# Patient Record
Sex: Female | Born: 1995 | Race: Black or African American | Hispanic: No | Marital: Single | Smoking: Former smoker
Health system: Southern US, Community
[De-identification: ages and names within clinical notes are randomized; demographics above are authoritative.]

## PROBLEM LIST (undated history)

## (undated) ENCOUNTER — Inpatient Hospital Stay (HOSPITAL_COMMUNITY): Payer: Self-pay

## (undated) DIAGNOSIS — D649 Anemia, unspecified: Secondary | ICD-10-CM

## (undated) DIAGNOSIS — G43909 Migraine, unspecified, not intractable, without status migrainosus: Secondary | ICD-10-CM

## (undated) DIAGNOSIS — F909 Attention-deficit hyperactivity disorder, unspecified type: Secondary | ICD-10-CM

## (undated) DIAGNOSIS — F32A Depression, unspecified: Secondary | ICD-10-CM

## (undated) DIAGNOSIS — F419 Anxiety disorder, unspecified: Secondary | ICD-10-CM

## (undated) HISTORY — DX: Anxiety disorder, unspecified: F41.9

## (undated) HISTORY — PX: NO PAST SURGERIES: SHX2092

## (undated) HISTORY — PX: NERVE SURGERY: SHX1016

---
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
## 2017-05-12 ENCOUNTER — Encounter (HOSPITAL_COMMUNITY): Payer: Self-pay

## 2017-10-28 DIAGNOSIS — G43009 Migraine without aura, not intractable, without status migrainosus: Secondary | ICD-10-CM | POA: Insufficient documentation

## 2020-01-01 ENCOUNTER — Encounter (HOSPITAL_COMMUNITY): Payer: Self-pay | Admitting: Emergency Medicine

## 2020-01-01 ENCOUNTER — Emergency Department (HOSPITAL_COMMUNITY)
Admission: EM | Admit: 2020-01-01 | Discharge: 2020-01-01 | Disposition: A | Discharge status: Home / Self Care | Payer: Medicaid Other | Attending: Emergency Medicine | Admitting: Emergency Medicine

## 2020-01-01 ENCOUNTER — Other Ambulatory Visit: Payer: Self-pay

## 2020-01-01 DIAGNOSIS — H53149 Visual discomfort, unspecified: Secondary | ICD-10-CM | POA: Diagnosis not present

## 2020-01-01 DIAGNOSIS — R059 Cough, unspecified: Secondary | ICD-10-CM

## 2020-01-01 DIAGNOSIS — F17228 Nicotine dependence, chewing tobacco, with other nicotine-induced disorders: Secondary | ICD-10-CM | POA: Insufficient documentation

## 2020-01-01 DIAGNOSIS — R05 Cough: Secondary | ICD-10-CM | POA: Insufficient documentation

## 2020-01-01 DIAGNOSIS — F1210 Cannabis abuse, uncomplicated: Secondary | ICD-10-CM | POA: Diagnosis not present

## 2020-01-01 DIAGNOSIS — R112 Nausea with vomiting, unspecified: Secondary | ICD-10-CM | POA: Insufficient documentation

## 2020-01-01 DIAGNOSIS — F40298 Other specified phobia: Secondary | ICD-10-CM | POA: Insufficient documentation

## 2020-01-01 DIAGNOSIS — G43809 Other migraine, not intractable, without status migrainosus: Secondary | ICD-10-CM | POA: Insufficient documentation

## 2020-01-01 DIAGNOSIS — Z20822 Contact with and (suspected) exposure to covid-19: Secondary | ICD-10-CM | POA: Diagnosis not present

## 2020-01-01 DIAGNOSIS — R519 Headache, unspecified: Secondary | ICD-10-CM | POA: Diagnosis present

## 2020-01-01 LAB — URINALYSIS, ROUTINE W REFLEX MICROSCOPIC
Bilirubin Urine: NEGATIVE
Glucose, UA: NEGATIVE mg/dL
Hgb urine dipstick: NEGATIVE
Ketones, ur: NEGATIVE mg/dL
Nitrite: NEGATIVE
Protein, ur: NEGATIVE mg/dL
Specific Gravity, Urine: 1.018 (ref 1.005–1.030)
WBC, UA: >50 WBC/hpf — ABNORMAL HIGH (ref 0–5)
pH: 8 (ref 5.0–8.0)

## 2020-01-01 LAB — CBC
HCT: 36.2 % (ref 36.0–46.0)
Hemoglobin: 10.6 g/dL — ABNORMAL LOW (ref 12.0–15.0)
MCH: 24.1 pg — ABNORMAL LOW (ref 26.0–34.0)
MCHC: 29.3 g/dL — ABNORMAL LOW (ref 30.0–36.0)
MCV: 82.5 fL (ref 80.0–100.0)
Platelets: 278 10*3/uL (ref 150–400)
RBC: 4.39 MIL/uL (ref 3.87–5.11)
RDW: 15.9 % — ABNORMAL HIGH (ref 11.5–15.5)
WBC: 6.8 10*3/uL (ref 4.0–10.5)
nRBC: 0 % (ref 0.0–0.2)

## 2020-01-01 LAB — COMPREHENSIVE METABOLIC PANEL
ALT: 18 U/L (ref 0–44)
AST: 19 U/L (ref 15–41)
Albumin: 3.7 g/dL (ref 3.5–5.0)
Alkaline Phosphatase: 72 U/L (ref 38–126)
Anion gap: 8 (ref 5–15)
BUN: 9 mg/dL (ref 6–20)
CO2: 24 mmol/L (ref 22–32)
Calcium: 9.3 mg/dL (ref 8.9–10.3)
Chloride: 108 mmol/L (ref 98–111)
Creatinine, Ser: 0.92 mg/dL (ref 0.44–1.00)
GFR calc Af Amer: >60 mL/min (ref >60)
GFR calc non Af Amer: >60 mL/min (ref >60)
Glucose, Bld: 113 mg/dL — ABNORMAL HIGH (ref 70–99)
Potassium: 4.5 mmol/L (ref 3.5–5.1)
Sodium: 140 mmol/L (ref 135–145)
Total Bilirubin: 0.3 mg/dL (ref 0.3–1.2)
Total Protein: 7.6 g/dL (ref 6.5–8.1)

## 2020-01-01 LAB — I-STAT BETA HCG BLOOD, ED (MC, WL, AP ONLY): I-stat hCG, quantitative: <5 m[IU]/mL (ref <5)

## 2020-01-01 LAB — RAPID URINE DRUG SCREEN, HOSP PERFORMED
Amphetamines: NOT DETECTED
Barbiturates: NOT DETECTED
Benzodiazepines: NOT DETECTED
Cocaine: NOT DETECTED
Opiates: NOT DETECTED
Tetrahydrocannabinol: POSITIVE — AB

## 2020-01-01 LAB — LIPASE, BLOOD: Lipase: 30 U/L (ref 11–51)

## 2020-01-01 MED ORDER — SODIUM CHLORIDE 0.9% FLUSH
3.0000 mL | Freq: Once | INTRAVENOUS | Status: AC
  Administered 2020-01-01: 15:00:00 3 mL via INTRAVENOUS

## 2020-01-01 MED ORDER — METOCLOPRAMIDE HCL 5 MG/ML IJ SOLN
5.0000 mg | Freq: Once | INTRAMUSCULAR | Status: AC
  Administered 2020-01-01: 15:00:00 5 mg via INTRAVENOUS
  Filled 2020-01-01: qty 2

## 2020-01-01 MED ORDER — SODIUM CHLORIDE 0.9 % IV BOLUS
1000.0000 mL | Freq: Once | INTRAVENOUS | Status: AC
  Administered 2020-01-01: 15:00:00 1000 mL via INTRAVENOUS

## 2020-01-01 MED ORDER — DIPHENHYDRAMINE HCL 50 MG/ML IJ SOLN
25.0000 mg | Freq: Once | INTRAMUSCULAR | Status: AC
  Administered 2020-01-01: 15:00:00 25 mg via INTRAVENOUS
  Filled 2020-01-01: qty 1

## 2020-01-01 MED ORDER — KETOROLAC TROMETHAMINE 15 MG/ML IJ SOLN
15.0000 mg | Freq: Once | INTRAMUSCULAR | Status: AC
  Administered 2020-01-01: 17:00:00 15 mg via INTRAVENOUS
  Filled 2020-01-01: qty 1

## 2020-01-01 NOTE — ED Provider Notes (Addendum)
Care handoff received from [facility], PA-C at shift change, please see previous provider note for full details of visit.  In short 24 year old female with history of ADHD, migraines, cannabis use presented today for headache, nausea, vomiting and chills.  Headache today feels similar to previous migraines.  No red flag symptoms.  Lab work obtained given emesis and migraine cocktail provided.  No Covid exposures but plan to obtain outpatient Covid test.  CBC showed no leukocytosis, hemoglobin of 10.6 appears consistent with prior.  CMP without emergent electrolyte derangement, evidence of kidney injury or acute elevation of LFTs.  Pregnancy test negative.  Lipase within normal limits no evidence of pancreatitis.  UDS and urinalysis pending at shift change.  Plan of care at shift change was to await UDS and urinalysis, reassess.  Pending improvement, p.o. challenge and discharge with outpatient Covid test. Physical Exam  BP (!) 140/94 (BP Location: Right Arm)   Pulse 83   Temp 98.3 F (36.8 C) (Oral)   Resp 18   Ht 5\' 6"  (1.676 m)   Wt 104.3 kg   LMP 12/20/2019   SpO2 100%   BMI 37.12 kg/m   Physical Exam Constitutional:      General: She is not in acute distress.    Appearance: Normal appearance. She is well-developed. She is not ill-appearing or diaphoretic.  HENT:     Head: Normocephalic and atraumatic.     Right Ear: External ear normal.     Left Ear: External ear normal.     Nose: Nose normal.  Eyes:     General: Vision grossly intact. Gaze aligned appropriately.     Pupils: Pupils are equal, round, and reactive to light.  Neck:     Trachea: Trachea and phonation normal. No tracheal deviation.     Meningeal: Brudzinski's sign absent.  Pulmonary:     Effort: Pulmonary effort is normal. No respiratory distress.  Musculoskeletal:        General: Normal range of motion.     Cervical back: Full passive range of motion without pain, normal range of motion and neck supple.   Skin:    General: Skin is warm and dry.  Neurological:     Mental Status: She is alert.     GCS: GCS eye subscore is 4. GCS verbal subscore is 5. GCS motor subscore is 6.     Comments: Speech is clear and goal oriented, follows commands Major Cranial nerves without deficit, no facial droop Moves extremities without ataxia, coordination intact Negative Romberg, no pronator drift  Psychiatric:        Behavior: Behavior normal.     ED Course/Procedures     Procedures  MDM  Patient was given migraine cocktail by previous provider, 1 L fluid bolus, Benadryl 25 mg and Reglan 5 mg.  Afterwards headache had continued and previous provider ordered 15 mg Toradol.  On reevaluation patient is sleeping, easily arousable to voice.  She reports that her headache is resolved and she would like to go home she has no concerns at this time.  Her urinalysis has just resulted it shows contamination with 21-50 squamous cells, there are multiple white blood cells as well as large leukocytes, nitrite is negative.  Patient denies any urinary symptoms on exam, she has no GU concerns and wishes to await culture report before proceeding with any antibiotics as she is asymptomatic, feel this is reasonable at this time. Discussed with Dr. Darl Householder. UDS is still pending at discharge however do not  feel positive results would change management today and do not feel patient needs to stay for those results.  Patient complained of URI symptoms to previous provider, outpatient Covid test has been ordered.  Patient aware to follow-up on results on her MyChart account tomorrow and discuss results with the primary care provider and to quarantine x10 days.  She has no active cough, no shortness of breath or additional concerns at this time.  She has normal gait at discharge, normal neuro exam, no meningeal signs she is well-appearing and in no acute distress, her father is driving home.  At this time there does not appear to be any  evidence of an acute emergency medical condition and the patient appears stable for discharge with appropriate outpatient follow up. Diagnosis was discussed with patient who verbalizes understanding of care plan and is agreeable to discharge. I have discussed return precautions with patient and father who verbalizes understanding of return precautions. Patient encouraged to follow-up with their PCP. All questions answered. = Addendum: UDS positive for THC otherwise negative.  Note: Portions of this report may have been transcribed using voice recognition software. Every effort was made to ensure accuracy; however, inadvertent computerized transcription errors may still be present.    Bill Salinas, PA-C 01/01/20 1858    Elizabeth Palau 01/01/20 Dorene Sorrow, MD 01/02/20 [phone]

## 2020-01-01 NOTE — Discharge Instructions (Signed)
You have been diagnosed today with Headache, nausea, vomiting, Cough, Concern for COVID-19 infection.  At this time there does not appear to be the presence of an emergent medical condition, however there is always the potential for conditions to change. Please read and follow the below instructions.  Please return to the Emergency Department immediately for any new or worsening symptoms. Please be sure to follow up with your Primary Care Provider within one week regarding your visit today; please call their office to schedule an appointment even if you are feeling better for a follow-up visit. Your Covid test is pending.  Your result will be available on your MyChart account in the next 1-2 days.  Please discuss the results with your primary care provider when they are available.  Continue to wear your mask and to quarantine x 10 days to avoid spread of any illnesses.  Get help right away if: Your migraine headache gets very bad. Your migraine headache lasts longer than 72 hours. You have a fever. You have a stiff neck. You have trouble seeing. Your muscles feel weak or like you cannot control them. You start to lose your balance a lot. You start to have trouble walking. You pass out (faint). You have a seizure. You have any new/concerning or worsening of symptoms  Please read the additional information packets attached to your discharge summary.  Do not take your medicine if  develop an itchy rash, swelling in your mouth or lips, or difficulty breathing; call 911 and seek immediate emergency medical attention if this occurs.  Note: Portions of this text may have been transcribed using voice recognition software. Every effort was made to ensure accuracy; however, inadvertent computerized transcription errors may still be present.

## 2020-01-01 NOTE — ED Notes (Signed)
Pt brought back until urine resulted

## 2020-01-01 NOTE — ED Triage Notes (Signed)
Pt. Stated, Tracey Ho had a headache with Nausea vomiting since this morning. I also have chills.

## 2020-01-01 NOTE — ED Provider Notes (Signed)
[facility] EMERGENCY DEPARTMENT Provider Note   CSN: [id number] Arrival date & time: 01/01/20  1319     History Chief Complaint  Patient presents with  . Headache  . Emesis  . Nausea    Katlyn Muldrew is a 24 y.o. female with past  medical history significant for ADHD, migraine, cannabis abuse who presents for evaluation of headache, nausea and vomiting.  Also has chills.  Has had nonproductive cough.  Denies sudden onset thunderclap headache.  Rates her pain a 10/10.  States feels similar to prior migraines.  Associated photophobia without phonophobia.  No neck pain, neck stiffness, chest pain, shortness of breath, abdominal pain, diarrhea, dysuria, pelvic pain, vaginal discharge, unilateral weakness, facial droop, difficulty with word finding.  No known Covid contacts or she has been coughing as well.  Known cannabinoid use.  Has not take anything for symptoms.  Denies additional aggravating or alleviating factors.  History obtained from patient and past medical records.  No interpreter is used.  HPI     Past Medical History:  Diagnosis Date  . ADHD (attention deficit hyperactivity disorder)   . Migraine headache     Patient Active Problem List   Diagnosis Date Noted  . MDD (major depressive disorder), single episode, moderate (HCC) 08/04/2012  . ADHD (attention deficit hyperactivity disorder), combined type 08/04/2012  . ODD (oppositional defiant disorder) 08/04/2012  . Cannabis abuse 08/04/2012    Past Surgical History:  Procedure Laterality Date  . NERVE SURGERY    . NO PAST SURGERIES       OB History    Gravida  5   Para  0   Term  0   Preterm  0   AB  2   Living  0     SAB  2   TAB  0   Ectopic  0   Multiple  0   Live Births  1           Family History  Problem Relation Age of Onset  . Thyroid disease Mother     Social History   Tobacco Use  . Smoking status: Former Smoker    Types: Cigars  . Smokeless tobacco:  Current User  Substance Use Topics  . Alcohol use: No    Comment: LAST TIME WAS 2 MTHS AGO  . Drug use: Yes    Types: Marijuana    Comment: Last use Sept 2013    Home Medications Prior to Admission medications   Medication Sig Start Date End Date Taking? Authorizing Provider  acetaminophen (TYLENOL) 325 MG tablet Take 650 mg by mouth every 6 (six) hours as needed. Pain    [provider]  Prenatal Vit-Fe Fumarate-FA (PRENATAL MULTIVITAMIN) TABS Take 1 tablet by mouth daily. 08/10/12   Winson, Louie Bun, NP  [place] [facility] [address] 1 application vaginally daily.    [provider]    Allergies    Patient has no known allergies.  Review of Systems   Review of Systems  Constitutional: Negative.   HENT: Negative.   Eyes: Positive for photophobia. Negative for pain, discharge, redness, itching and visual disturbance.  Respiratory: Positive for cough. Negative for apnea, choking, chest tightness, shortness of breath, wheezing and stridor.   Cardiovascular: Negative.   Gastrointestinal: Positive for nausea and vomiting. Negative for abdominal distention, abdominal pain, anal bleeding, blood in stool, constipation, diarrhea and rectal pain.  Genitourinary: Negative.   Musculoskeletal: Negative.   Skin: Negative.   Neurological: Positive  for headaches. Negative for dizziness, tremors, seizures, syncope, facial asymmetry, speech difficulty, weakness, light-headedness and numbness.  All other systems reviewed and are negative.   Physical Exam Updated Vital Signs BP (!) 140/94 (BP Location: Right Arm)   Pulse 83   Temp 98.3 F (36.8 C) (Oral)   Resp 18   Ht 5\' 6"  (1.676 m)   Wt 104.3 kg   LMP 12/20/2019   SpO2 100%   BMI 37.12 kg/m   Physical Exam Physical Exam  Constitutional: Pt is oriented to person, place, and time. Pt appears well-developed and well-nourished. No distress.  HENT:  Head: Normocephalic and atraumatic.  Mouth/Throat: Oropharynx is clear and  moist.  Eyes: Conjunctivae and EOM are normal. Pupils are equal, round, and reactive to light. No scleral icterus.  No horizontal, vertical or rotational nystagmus  Neck: Normal range of motion. Neck supple.  Full active and passive ROM without pain No midline or paraspinal tenderness No nuchal rigidity or meningeal signs  Cardiovascular: Normal rate, regular rhythm and intact distal pulses.   Pulmonary/Chest: Effort normal and breath sounds normal. No respiratory distress. Pt has no wheezes. No rales.  Abdominal: Soft. Bowel sounds are normal. There is no tenderness. There is no rebound and no guarding.  Musculoskeletal: Normal range of motion.  Lymphadenopathy:    No cervical adenopathy.  Neurological: Pt. is alert and oriented to person, place, and time. He has normal reflexes. No cranial nerve deficit.  Exhibits normal muscle tone. Coordination normal.  Mental Status:  Alert, oriented, thought content appropriate. Speech fluent without evidence of aphasia. Able to follow 2 step commands without difficulty.  Cranial Nerves:  II:  Peripheral visual fields grossly normal, pupils equal, round, reactive to light III,IV, VI: ptosis not present, extra-ocular motions intact bilaterally  V,VII: smile symmetric, facial light touch sensation equal VIII: hearing grossly normal bilaterally  IX,X: midline uvula rise  XI: bilateral shoulder shrug equal and strong XII: midline tongue extension  Motor:  5/5 in upper and lower extremities bilaterally including strong and equal grip strength and dorsiflexion/plantar flexion Sensory: Pinprick and light touch normal in all extremities.  Deep Tendon Reflexes: 2+ and symmetric  Cerebellar: normal finger-to-nose with bilateral upper extremities Gait: normal gait and balance CV: distal pulses palpable throughout   Skin: Skin is warm and dry. No rash noted. Pt is not diaphoretic.  Psychiatric: Pt has a normal mood and affect. Behavior is normal. Judgment  and thought content normal.  Nursing note and vitals reviewed. ED Results / Procedures / Treatments   Labs (all labs ordered are listed, but only abnormal results are displayed) Labs Reviewed  COMPREHENSIVE METABOLIC PANEL - Abnormal; Notable for the following components:      Result Value   Glucose, Bld 113 (*)    All other components within normal limits  CBC - Abnormal; Notable for the following components:   Hemoglobin 10.6 (*)    MCH 24.1 (*)    MCHC 29.3 (*)    RDW 15.9 (*)    All other components within normal limits  LIPASE, BLOOD  URINALYSIS, ROUTINE W REFLEX MICROSCOPIC  RAPID URINE DRUG SCREEN, HOSP PERFORMED  I-STAT BETA HCG BLOOD, ED ([facility], [facility], [facility] ONLY)    EKG None  Radiology No results found.  Procedures Procedures (including critical care time)  Medications Ordered in ED Medications  ketorolac (TORADOL) 15 MG/ML injection 15 mg (has no administration in time range)  sodium chloride flush (NS) 0.9 % injection 3 mL (3 mLs Intravenous  Given 01/01/20 1446)  diphenhydrAMINE (BENADRYL) injection 25 mg (25 mg Intravenous Given 01/01/20 1443)  metoCLOPramide (REGLAN) injection 5 mg (5 mg Intravenous Given 01/01/20 1443)  sodium chloride 0.9 % bolus 1,000 mL (1,000 mLs Intravenous New Bag/Given 01/01/20 1445)    ED Course  I have reviewed the triage vital signs and the nursing notes.  Pertinent labs & imaging results that were available during my care of the patient were reviewed by me and considered in my medical decision making (see chart for details).  25 year old female presents for evaluation of headache, nausea and vomiting.  Known history of migraines states this feels similar.  Has also had cough however no known Covid exposures.  Multiple episodes of NBNB emesis.  Also known cannabinoid use.  No sudden onset thunderclap headache, recent injury or trauma.  No neck stiffness or neck rigidity.  No meningismus.  Her heart and lungs are clear.  Her abdomen is soft,  nontender without rebound or guarding.  No unilateral leg swelling, redness or warmth.  Given emesis will obtain labs, provide migraine cocktail.  Given she is afebrile has clear lung sounds do not think she needs chest x-ray at this time.  Will also give fluid bolus.  Plan on outpatient Covid testing  Labs and imaging personally reviewed and interpreted: CBC without leukocytosis, hemoglobin 10.6 Metabolic panel with mild hyperglycemia to 113, Regnancy test negative Lipase 30 Urinalysis pending UDS pending   Care transferred to [facility], [place] who will follow up on urinalysis, UDS and reevaluation of patient.  If patient headache improved, likely p.o. challenge and do outpatient Covid test.  If headache unresolved may try magnesium for additional management.  Presentation  non concerning for dissection, CVA, SAH, ICH, Meningitis, or temporal arteritis.    MDM Rules/Calculators/A&P                      Lian Pounds was evaluated in Emergency Department on 01/01/2020 for the symptoms described in the history of present illness. She was evaluated in the context of the global COVID-19 pandemic, which necessitated consideration that the patient might be at risk for infection with the SARS-CoV-2 virus that causes COVID-19. Institutional protocols and algorithms that pertain to the evaluation of patients at risk for COVID-19 are in a state of rapid change based on information released by regulatory bodies including the CDC and federal and state organizations. These policies and algorithms were followed during the patient's care in the ED. Final Clinical Impression(s) / ED Diagnoses Final diagnoses:  Non-intractable vomiting with nausea, unspecified vomiting type  Other migraine without status migrainosus, not intractable  COVID-19 virus test result unknown  Cough    Rx / DC Orders ED Discharge Orders    None       Suzy Kugel A, PA-C 01/01/20 1603    Jacalyn Lefevre, MD 01/01/20  1612

## 2020-01-02 LAB — URINE CULTURE

## 2020-01-02 LAB — SARS CORONAVIRUS 2 (TAT 6-24 HRS): SARS Coronavirus 2: NEGATIVE

## 2020-10-22 DIAGNOSIS — N730 Acute parametritis and pelvic cellulitis: Secondary | ICD-10-CM

## 2020-10-22 DIAGNOSIS — A6000 Herpesviral infection of urogenital system, unspecified: Secondary | ICD-10-CM | POA: Insufficient documentation

## 2020-10-22 HISTORY — DX: Acute parametritis and pelvic cellulitis: N73.0

## 2021-04-16 DIAGNOSIS — A630 Anogenital (venereal) warts: Secondary | ICD-10-CM | POA: Insufficient documentation

## 2022-01-08 ENCOUNTER — Inpatient Hospital Stay (HOSPITAL_COMMUNITY)
Admission: AD | Admit: 2022-01-08 | Discharge: 2022-01-08 | Disposition: A | Discharge status: Home / Self Care | Payer: Medicaid Other | Attending: Obstetrics and Gynecology | Admitting: Obstetrics and Gynecology

## 2022-01-08 ENCOUNTER — Other Ambulatory Visit: Payer: Self-pay

## 2022-01-08 ENCOUNTER — Inpatient Hospital Stay (HOSPITAL_COMMUNITY): Payer: Medicaid Other

## 2022-01-08 ENCOUNTER — Encounter (HOSPITAL_COMMUNITY): Payer: Self-pay | Admitting: Obstetrics and Gynecology

## 2022-01-08 DIAGNOSIS — R42 Dizziness and giddiness: Secondary | ICD-10-CM | POA: Insufficient documentation

## 2022-01-08 DIAGNOSIS — R55 Syncope and collapse: Secondary | ICD-10-CM | POA: Diagnosis not present

## 2022-01-08 DIAGNOSIS — R109 Unspecified abdominal pain: Secondary | ICD-10-CM | POA: Insufficient documentation

## 2022-01-08 DIAGNOSIS — Z3A09 9 weeks gestation of pregnancy: Secondary | ICD-10-CM | POA: Diagnosis not present

## 2022-01-08 DIAGNOSIS — O26891 Other specified pregnancy related conditions, first trimester: Secondary | ICD-10-CM | POA: Insufficient documentation

## 2022-01-08 DIAGNOSIS — O26841 Uterine size-date discrepancy, first trimester: Secondary | ICD-10-CM

## 2022-01-08 DIAGNOSIS — O99611 Diseases of the digestive system complicating pregnancy, first trimester: Secondary | ICD-10-CM | POA: Insufficient documentation

## 2022-01-08 DIAGNOSIS — O99280 Endocrine, nutritional and metabolic diseases complicating pregnancy, unspecified trimester: Secondary | ICD-10-CM

## 2022-01-08 DIAGNOSIS — K5900 Constipation, unspecified: Secondary | ICD-10-CM | POA: Diagnosis not present

## 2022-01-08 DIAGNOSIS — E860 Dehydration: Secondary | ICD-10-CM

## 2022-01-08 DIAGNOSIS — Z3A08 8 weeks gestation of pregnancy: Secondary | ICD-10-CM

## 2022-01-08 HISTORY — DX: Anemia, unspecified: D64.9

## 2022-01-08 LAB — COMPREHENSIVE METABOLIC PANEL
ALT: 11 U/L (ref 0–44)
AST: 14 U/L — ABNORMAL LOW (ref 15–41)
Albumin: 3.5 g/dL (ref 3.5–5.0)
Alkaline Phosphatase: 64 U/L (ref 38–126)
Anion gap: 8 (ref 5–15)
BUN: 7 mg/dL (ref 6–20)
CO2: 23 mmol/L (ref 22–32)
Calcium: 9 mg/dL (ref 8.9–10.3)
Chloride: 104 mmol/L (ref 98–111)
Creatinine, Ser: 0.77 mg/dL (ref 0.44–1.00)
GFR, Estimated: >60 mL/min (ref >60)
Glucose, Bld: 116 mg/dL — ABNORMAL HIGH (ref 70–99)
Potassium: 3.8 mmol/L (ref 3.5–5.1)
Sodium: 135 mmol/L (ref 135–145)
Total Bilirubin: 1.4 mg/dL — ABNORMAL HIGH (ref 0.3–1.2)
Total Protein: 7.2 g/dL (ref 6.5–8.1)

## 2022-01-08 LAB — URINALYSIS, ROUTINE W REFLEX MICROSCOPIC
Bilirubin Urine: NEGATIVE
Glucose, UA: NEGATIVE mg/dL
Ketones, ur: 20 mg/dL — AB
Nitrite: NEGATIVE
Protein, ur: 100 mg/dL — AB
Specific Gravity, Urine: 1.029 (ref 1.005–1.030)
pH: 5 (ref 5.0–8.0)

## 2022-01-08 LAB — CBC
HCT: 34 % — ABNORMAL LOW (ref 36.0–46.0)
Hemoglobin: 10.4 g/dL — ABNORMAL LOW (ref 12.0–15.0)
MCH: 24.5 pg — ABNORMAL LOW (ref 26.0–34.0)
MCHC: 30.6 g/dL (ref 30.0–36.0)
MCV: 80.2 fL (ref 80.0–100.0)
Platelets: 285 10*3/uL (ref 150–400)
RBC: 4.24 MIL/uL (ref 3.87–5.11)
RDW: 14.1 % (ref 11.5–15.5)
WBC: 6.2 10*3/uL (ref 4.0–10.5)
nRBC: 0 % (ref 0.0–0.2)

## 2022-01-08 LAB — WET PREP, GENITAL
Sperm: NONE SEEN
Trich, Wet Prep: NONE SEEN
WBC, Wet Prep HPF POC: >=10 — AB (ref <10)
Yeast Wet Prep HPF POC: NONE SEEN

## 2022-01-08 LAB — HCG, QUANTITATIVE, PREGNANCY: hCG, Beta Chain, Quant, S: 177801 m[IU]/mL — ABNORMAL HIGH (ref <5)

## 2022-01-08 MED ORDER — POLYETHYLENE GLYCOL 3350 17 GM/SCOOP PO POWD: 17.0000 g | Freq: Two times a day (BID) | ORAL | 2 refills | Status: DC | PRN

## 2022-01-08 MED ORDER — BISACODYL 10 MG RE SUPP: 10.0000 mg | Freq: Every day | RECTAL | 0 refills | Status: DC | PRN

## 2022-01-08 NOTE — MAU Provider Note (Addendum)
?History  ?  ? ?CSN: [id number] ? ?Arrival date and time: 01/08/22 1445 ? ? Event Date/Time  ? First Provider Initiated Contact with Patient 01/08/22 1522   ?  ? ?Chief Complaint  ?Patient presents with  ? Near Syncope  ? ?Tracey Ho is a 26 year old [id number] at [redacted]w[redacted]d who presented to MAU for dizziness which patient said lasted about 15 minutes this morning. Patient said she had just walked into the house when she felt the room spinning and got dizzy shortly after. She sat down to relax without improvement and started feeling warm and sweaty when she walked to the bathroom.  Her warm feeling and sweatiness lasted about 5 minutes however the dizziness continued.  Patient reports intermittent dizziness about once or twice a week since pregnancy usually lasting about 5 seconds.  She endorses abdominal cramps during these episode. She has a history of chronic constipation and reported last bowel movements to be about a week ago. She feels nauseated occasional but without emesis. She has appetite but have early satiety and endorses drinking adequate fluid, mostly water and juice. Patient denies any vision changes, chest pain or palpitation but reports mild headache that's not as bad as her migraines. She is no longer feeling dizzy but feels generally fatigued since the episode.  Per chart review patient's only [place] to date during pregnancy was indeterminate  for IUP which described locating intrauterine gestational sac. ? ?Near Syncope ?Associated symptoms include fatigue, headaches, nausea and weakness. Pertinent negatives include no chest pain, chills, fever or vomiting.  ? ?OB History   ? ? Gravida  ?5  ? Para  ?2  ? Term  ?2  ? Preterm  ?0  ? AB  ?2  ? Living  ?2  ?  ? ? SAB  ?2  ? IAB  ?0  ? Ectopic  ?0  ? Multiple  ?0  ? Live Births  ?2  ?   ?  ?  ? ? ?Past Medical History:  ?Diagnosis Date  ? ADHD (attention deficit hyperactivity disorder)   ? Anemia   ? Migraine headache   ? ? ?Past Surgical History:  ?Procedure  Laterality Date  ? NERVE SURGERY    ? NO PAST SURGERIES    ? ? ?Family History  ?Problem Relation Age of Onset  ? Thyroid disease Mother   ? ? ?Social History  ? ?Tobacco Use  ? Smoking status: Former  ?  Types: Cigars  ? Smokeless tobacco: Current  ?Vaping Use  ? Vaping Use: Every day  ?Substance Use Topics  ? Alcohol use: No  ?  Comment: LAST TIME WAS 2 MTHS AGO  ? Drug use: Not Currently  ?  Types: Marijuana  ?  Comment: Last use Sept 2013  ? ? ?Allergies: No Known Allergies ? ?Medications Prior to Admission  ?Medication Sig Dispense Refill Last Dose  ? acetaminophen (TYLENOL) 325 MG tablet Take 650 mg by mouth every 6 (six) hours as needed. Pain     ? Prenatal Vit-Fe Fumarate-FA (PRENATAL MULTIVITAMIN) TABS Take 1 tablet by mouth daily.     ? [place] [facility] [address] 1 application vaginally daily.     ? ? ?Review of Systems  ?Constitutional:  Positive for fatigue. Negative for appetite change, chills and fever.  ?Cardiovascular:  Positive for near-syncope. Negative for chest pain, palpitations and leg swelling.  ?Gastrointestinal:  Positive for constipation and nausea. Negative for diarrhea and vomiting.  ?Neurological:  Positive for dizziness, weakness  and headaches.  ?Physical Exam  ? ?Blood pressure 114/69, pulse 82, temperature 97.6 ?F (36.4 ?C), resp. rate 16, last menstrual period 11/02/2021, SpO2 96 %, unknown if currently breastfeeding. ? ?Physical Exam ?Constitutional:   ?   Appearance: Normal appearance.  ?Cardiovascular:  ?   Rate and Rhythm: Normal rate and regular rhythm.  ?   Pulses: Normal pulses.  ?   Heart sounds: Normal heart sounds.  ?Pulmonary:  ?   Effort: Pulmonary effort is normal.  ?   Breath sounds: Normal breath sounds. No rhonchi or rales.  ?Abdominal:  ?   General: Abdomen is flat. Bowel sounds are normal.  ?   Palpations: Abdomen is soft.  ?Neurological:  ?   General: No focal deficit present.  ?   Mental Status: She is alert and oriented to person, place, and time.  ?Psychiatric:     ?    Mood and Affect: Mood normal.     ?   Behavior: Behavior normal.  ? ? ?MAU Course  ?Procedures ? ?MDM ?Results for orders placed or performed during the hospital encounter of 01/08/22 (from the past 24 hour(s))  ?Urinalysis, Routine w reflex microscopic Urine, Clean Catch     Status: Abnormal  ? Collection Time: 01/08/22  3:00 PM  ?Result Value Ref Range  ? Color, Urine AMBER (A) YELLOW  ? APPearance CLOUDY (A) CLEAR  ? Specific Gravity, Urine 1.029 1.005 - 1.030  ? pH 5.0 5.0 - 8.0  ? Glucose, UA NEGATIVE NEGATIVE mg/dL  ? Hgb urine dipstick SMALL (A) NEGATIVE  ? Bilirubin Urine NEGATIVE NEGATIVE  ? Ketones, ur 20 (A) NEGATIVE mg/dL  ? Protein, ur 100 (A) NEGATIVE mg/dL  ? Nitrite NEGATIVE NEGATIVE  ? Leukocytes,Ua MODERATE (A) NEGATIVE  ? RBC / HPF 11-20 0 - 5 RBC/hpf  ? WBC, UA 11-20 0 - 5 WBC/hpf  ? Bacteria, UA MANY (A) NONE SEEN  ? Squamous Epithelial / LPF 11-20 0 - 5  ? Mucus PRESENT   ? Hyaline Casts, UA PRESENT   ? Non Squamous Epithelial 0-5 (A) NONE SEEN  ?hCG, quantitative, pregnancy     Status: Abnormal  ? Collection Time: 01/08/22  3:34 PM  ?Result Value Ref Range  ? hCG, Beta Chain, Quant, S 177,801 (H) <5 mIU/mL  ?CBC     Status: Abnormal  ? Collection Time: 01/08/22  3:34 PM  ?Result Value Ref Range  ? WBC 6.2 4.0 - 10.5 K/uL  ? RBC 4.24 3.87 - 5.11 MIL/uL  ? Hemoglobin 10.4 (L) 12.0 - 15.0 g/dL  ? HCT 34.0 (L) 36.0 - 46.0 %  ? MCV 80.2 80.0 - 100.0 fL  ? MCH 24.5 (L) 26.0 - 34.0 pg  ? MCHC 30.6 30.0 - 36.0 g/dL  ? RDW 14.1 11.5 - 15.5 %  ? Platelets 285 150 - 400 K/uL  ? nRBC 0.0 0.0 - 0.2 %  ?Comprehensive metabolic panel     Status: Abnormal  ? Collection Time: 01/08/22  3:34 PM  ?Result Value Ref Range  ? Sodium 135 135 - 145 mmol/L  ? Potassium 3.8 3.5 - 5.1 mmol/L  ? Chloride 104 98 - 111 mmol/L  ? CO2 23 22 - 32 mmol/L  ? Glucose, Bld 116 (H) 70 - 99 mg/dL  ? BUN 7 6 - 20 mg/dL  ? Creatinine, Ser 0.77 0.44 - 1.00 mg/dL  ? Calcium 9.0 8.9 - 10.3 mg/dL  ? Total Protein 7.2 6.5 - 8.1 g/dL  ?  Albumin 3.5 3.5 - 5.0 g/dL  ? AST 14 (L) 15 - 41 U/L  ? ALT 11 0 - 44 U/L  ? Alkaline Phosphatase 64 38 - 126 U/L  ? Total Bilirubin 1.4 (H) 0.3 - 1.2 mg/dL  ? GFR, Estimated >60 >60 mL/min  ? Anion gap 8 5 - 15  ?Wet prep, genital     Status: Abnormal  ? Collection Time: 01/08/22  5:03 PM  ?Result Value Ref Range  ? Yeast Wet Prep HPF POC NONE SEEN NONE SEEN  ? Trich, Wet Prep NONE SEEN NONE SEEN  ? Clue Cells Wet Prep HPF POC PRESENT (A) NONE SEEN  ? WBC, Wet Prep HPF POC >=10 (A) <10  ? Sperm NONE SEEN   ?  ? ?US OB LESS THAN 14 WEEKS WITH OB TRANSVAGINAL ? ?Result Date: 01/08/2022 ?CLINICAL DATA:  Abdominal pain. Estimated gestational age by last menstrual period equals 9 weeks 4 days EXAM: OBSTETRIC <14 WK US AND TRANSVAGINAL OB US TECHNIQUE: Both transabdominal and transvaginal ultrasound examinations were performed for complete evaluation of the gestation as well as the maternal uterus, adnexal regions, and pelvic cul-de-sac. Transvaginal technique was performed to assess early pregnancy. COMPARISON:  None. FINDINGS: Intrauterine gestational sac: Single Yolk sac:  Present Embryo:  Present Cardiac Activity: Present Heart Rate: 161 bpm MSD:   mm    w     d CRL:  20 mm   8 w   4 d                  US EDC: 08/16/2022 Subchorionic hemorrhage: Rounded mass in the myometrium measuring 4.6 x 3.9 cm. Masses adjacent to the endometrium and gestational sac. Favor a uterine leiomyoma rather than the subchorionic hemorrhage. Maternal uterus/adnexae: Normal ovaries. Corpus luteal cyst on the RIGHT IMPRESSION: 1. Single intrauterine gestation with embryo and normal cardiac activity. 2. Estimated gestational age by crown rump length equals 8 weeks 4 days. 3.  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   On: 01/08/2022 16:51   ? ? ?Assessment and Plan  ?Dizziness ?Patient with dizziness, sweating and generalized fatigued post event. EKG and electrolyte were normal. US show no signs  of hemorrhage and UA findings were consistent with possible dehydration. It is unclear as to main cause of her symptoms however her presentation is consistent with possible dehydration or vasovagal

## 2022-01-08 NOTE — MAU Note (Addendum)
Tracey Ho is a 26 y.o. at Unknown here in MAU reporting: Pt reports she went home around 1400 and she felt dizzy like she was going to pass out. Pt reports she went to the bathroom started pouring with sweat and called the ambulance because she thought she was going to pass out.  ?Onset of complaint: today 1400 ?Pain score: 6/10 abdomen cramping  ?There were no vitals filed for this visit.   ?Lab orders placed from triage:  ua  ?

## 2022-01-09 LAB — GC/CHLAMYDIA PROBE AMP (~~LOC~~) NOT AT ARMC
Chlamydia: POSITIVE — AB
Comment: NEGATIVE
Comment: NORMAL
Neisseria Gonorrhea: NEGATIVE

## 2022-01-11 DIAGNOSIS — Z8632 Personal history of gestational diabetes: Secondary | ICD-10-CM | POA: Insufficient documentation

## 2022-01-14 ENCOUNTER — Other Ambulatory Visit: Payer: Self-pay | Admitting: Advanced Practice Midwife

## 2022-01-14 NOTE — Progress Notes (Signed)
Pt retreated for Chlamydia at [place] at [facility] on 01/10/22.  ?

## 2022-03-19 ENCOUNTER — Inpatient Hospital Stay (HOSPITAL_COMMUNITY)
Admission: AD | Admit: 2022-03-19 | Discharge: 2022-03-19 | Disposition: A | Discharge status: Home / Self Care | Payer: Medicaid Other | Attending: Obstetrics and Gynecology | Admitting: Obstetrics and Gynecology

## 2022-03-19 ENCOUNTER — Other Ambulatory Visit: Payer: Self-pay

## 2022-03-19 ENCOUNTER — Encounter (HOSPITAL_COMMUNITY): Payer: Self-pay | Admitting: Obstetrics and Gynecology

## 2022-03-19 DIAGNOSIS — Z20822 Contact with and (suspected) exposure to covid-19: Secondary | ICD-10-CM | POA: Insufficient documentation

## 2022-03-19 DIAGNOSIS — O99322 Drug use complicating pregnancy, second trimester: Secondary | ICD-10-CM | POA: Diagnosis present

## 2022-03-19 DIAGNOSIS — F1190 Opioid use, unspecified, uncomplicated: Secondary | ICD-10-CM

## 2022-03-19 DIAGNOSIS — R0982 Postnasal drip: Secondary | ICD-10-CM

## 2022-03-19 DIAGNOSIS — O99512 Diseases of the respiratory system complicating pregnancy, second trimester: Secondary | ICD-10-CM | POA: Insufficient documentation

## 2022-03-19 DIAGNOSIS — O26892 Other specified pregnancy related conditions, second trimester: Secondary | ICD-10-CM | POA: Diagnosis not present

## 2022-03-19 DIAGNOSIS — Z3A18 18 weeks gestation of pregnancy: Secondary | ICD-10-CM

## 2022-03-19 DIAGNOSIS — R059 Cough, unspecified: Secondary | ICD-10-CM | POA: Diagnosis not present

## 2022-03-19 DIAGNOSIS — O219 Vomiting of pregnancy, unspecified: Secondary | ICD-10-CM | POA: Insufficient documentation

## 2022-03-19 LAB — CBC WITH DIFFERENTIAL/PLATELET
Abs Immature Granulocytes: 0.04 10*3/uL (ref 0.00–0.07)
Basophils Absolute: 0 10*3/uL (ref 0.0–0.1)
Basophils Relative: 0 %
Eosinophils Absolute: 0.1 10*3/uL (ref 0.0–0.5)
Eosinophils Relative: 2 %
HCT: 32.2 % — ABNORMAL LOW (ref 36.0–46.0)
Hemoglobin: 10.2 g/dL — ABNORMAL LOW (ref 12.0–15.0)
Immature Granulocytes: 1 %
Lymphocytes Relative: 13 %
Lymphs Abs: 0.9 10*3/uL (ref 0.7–4.0)
MCH: 25.1 pg — ABNORMAL LOW (ref 26.0–34.0)
MCHC: 31.7 g/dL (ref 30.0–36.0)
MCV: 79.3 fL — ABNORMAL LOW (ref 80.0–100.0)
Monocytes Absolute: 0.5 10*3/uL (ref 0.1–1.0)
Monocytes Relative: 7 %
Neutro Abs: 5.4 10*3/uL (ref 1.7–7.7)
Neutrophils Relative %: 77 %
Platelets: 211 10*3/uL (ref 150–400)
RBC: 4.06 MIL/uL (ref 3.87–5.11)
RDW: 15.2 % (ref 11.5–15.5)
WBC: 7 10*3/uL (ref 4.0–10.5)
nRBC: 0 % (ref 0.0–0.2)

## 2022-03-19 LAB — COMPREHENSIVE METABOLIC PANEL
ALT: 9 U/L (ref 0–44)
AST: 12 U/L — ABNORMAL LOW (ref 15–41)
Albumin: 2.9 g/dL — ABNORMAL LOW (ref 3.5–5.0)
Alkaline Phosphatase: 64 U/L (ref 38–126)
Anion gap: 11 (ref 5–15)
BUN: <5 mg/dL — ABNORMAL LOW (ref 6–20)
CO2: 21 mmol/L — ABNORMAL LOW (ref 22–32)
Calcium: 9.2 mg/dL (ref 8.9–10.3)
Chloride: 106 mmol/L (ref 98–111)
Creatinine, Ser: 0.68 mg/dL (ref 0.44–1.00)
GFR, Estimated: >60 mL/min (ref >60)
Glucose, Bld: 91 mg/dL (ref 70–99)
Potassium: 3.6 mmol/L (ref 3.5–5.1)
Sodium: 138 mmol/L (ref 135–145)
Total Bilirubin: 0.9 mg/dL (ref 0.3–1.2)
Total Protein: 6.9 g/dL (ref 6.5–8.1)

## 2022-03-19 LAB — URINALYSIS, ROUTINE W REFLEX MICROSCOPIC
Bilirubin Urine: NEGATIVE
Glucose, UA: NEGATIVE mg/dL
Ketones, ur: 80 mg/dL — AB
Nitrite: NEGATIVE
Protein, ur: 30 mg/dL — AB
Specific Gravity, Urine: 1.023 (ref 1.005–1.030)
WBC, UA: >50 WBC/hpf — ABNORMAL HIGH (ref 0–5)
pH: 5 (ref 5.0–8.0)

## 2022-03-19 LAB — RESP PANEL BY RT-PCR (FLU A&B, COVID) ARPGX2
Influenza A by PCR: NEGATIVE
Influenza B by PCR: NEGATIVE
SARS Coronavirus 2 by RT PCR: NEGATIVE

## 2022-03-19 LAB — LIPASE, BLOOD: Lipase: 37 U/L (ref 11–51)

## 2022-03-19 MED ORDER — ACETAMINOPHEN 10 MG/ML IV SOLN
1000.0000 mg | Freq: Once | INTRAVENOUS | Status: AC
  Administered 2022-03-19: 1000 mg via INTRAVENOUS
  Filled 2022-03-19: qty 100

## 2022-03-19 MED ORDER — BUPRENORPHINE HCL-NALOXONE HCL 8-2 MG SL SUBL: 1.0000 | SUBLINGUAL_TABLET | Freq: Every day | SUBLINGUAL | Status: DC

## 2022-03-19 MED ORDER — FAMOTIDINE IN NACL 20-0.9 MG/50ML-% IV SOLN
20.0000 mg | Freq: Once | INTRAVENOUS | Status: AC
  Administered 2022-03-19: 20 mg via INTRAVENOUS
  Filled 2022-03-19: qty 50

## 2022-03-19 MED ORDER — LACTATED RINGERS IV BOLUS
1000.0000 mL | Freq: Once | INTRAVENOUS | Status: AC
  Administered 2022-03-19: 1000 mL via INTRAVENOUS

## 2022-03-19 MED ORDER — BUPRENORPHINE HCL-NALOXONE HCL 8-2 MG SL SUBL
1.0000 | SUBLINGUAL_TABLET | Freq: Every day | SUBLINGUAL | Status: AC
  Administered 2022-03-19: 1 via SUBLINGUAL
  Filled 2022-03-19 (×2): qty 1

## 2022-03-19 MED ORDER — DM-GUAIFENESIN ER 30-600 MG PO TB12
1.0000 | ORAL_TABLET | Freq: Two times a day (BID) | ORAL | Status: DC
  Administered 2022-03-19: 1 via ORAL
  Filled 2022-03-19: qty 1

## 2022-03-19 MED ORDER — BUPRENORPHINE HCL-NALOXONE HCL 8-2 MG SL FILM: 1.0000 | ORAL_FILM | Freq: Every day | SUBLINGUAL | 0 refills | Status: DC

## 2022-03-19 MED ORDER — ONDANSETRON 4 MG PO TBDP
8.0000 mg | ORAL_TABLET | Freq: Once | ORAL | Status: AC
  Administered 2022-03-19: 8 mg via ORAL
  Filled 2022-03-19: qty 2

## 2022-03-19 MED ORDER — PHENOL 1.4 % MT LIQD
1.0000 | OROMUCOSAL | Status: DC | PRN
  Filled 2022-03-19: qty 177

## 2022-03-19 NOTE — MAU Provider Note (Signed)
Chief Complaint:  Emesis   Event Date/Time   First Provider Initiated Contact with Patient 03/19/22 0545     HPI: Tracey Ho is a 26 y.o. [id number] at [redacted]w[redacted]d who presents to maternity admissions reporting vomiting since 10pm. Also endorses severe pains in her upper/lower abdomen and legs, rhinorrhea, cough from the drainage and sore throat. Denies diarrhea, vaginal bleeding/discharge, leaking of fluid or fever.  No sick contacts, is concerned she may have strep from a known exposure, but has taken antibiotics for that. Very tearful with questioning, states she feels awful and just wants to feel better.  Currently receiving care at [facility], being seen/managed by the midwifery team. Chart review revealed known addiction to opiates using non-prescribed oxycodone. Has tested positive for cocaine before but that was likely accidental from her oxycodone. Pt has expressed desire to stop and begin recovery process, but has not been able to get in with any of the resources presented to her by her midwives.  Pregnancy Course: Complicated by nausea/vomiting, episodes of dizziness without syncope and opiate use disorder.  Past Medical History:  Diagnosis Date   ADHD (attention deficit hyperactivity disorder)    Anemia    Migraine headache    OB History  Gravida Para Term Preterm AB Living  5 2 2  0 2 2  SAB IAB Ectopic Multiple Live Births  2 0 0 0 2    # Outcome Date GA Lbr Len/2nd Weight Sex Delivery Anes PTL Lv  5 Current           4 Term 2017     Vag-Spont   LIV  3 Term 2016     Vag-Spont   LIV     Birth Comments: System Generated. Please review and update pregnancy details.  2 SAB 2014          1 SAB 2014           Past Surgical History:  Procedure Laterality Date   NERVE SURGERY     NO PAST SURGERIES     Family History  Problem Relation Age of Onset   Thyroid disease Mother    Social History   Tobacco Use   Smoking status: Former    Types: Cigars   Smokeless tobacco:  Current  Vaping Use   Vaping Use: Every day  Substance Use Topics   Alcohol use: No    Comment: LAST TIME WAS 2 MTHS AGO   Drug use: Not Currently    Types: Marijuana    Comment: Last use Sept 2013   No Known Allergies Medications Prior to Admission  Medication Sig Dispense Refill Last Dose   ferrous sulfate 325 (65 FE) MG tablet Take 325 mg by mouth daily with breakfast.   03/18/2022   Prenatal Vit-Fe Fumarate-FA (PRENATAL MULTIVITAMIN) TABS Take 1 tablet by mouth daily.   03/18/2022   acetaminophen (TYLENOL) 325 MG tablet Take 650 mg by mouth every 6 (six) hours as needed. Pain      bisacodyl (DULCOLAX) 10 MG suppository Place 1 suppository (10 mg total) rectally daily as needed for moderate constipation. 12 suppository 0    polyethylene glycol powder (GLYCOLAX/MIRALAX) 17 GM/SCOOP powder Take 17 g by mouth 2 (two) times daily as needed for moderate constipation. 500 g 2    TERCONAZOLE VA Place 1 application vaginally daily.      I have reviewed patient's Past Medical Hx, Surgical Hx, Family Hx, Social Hx, medications and allergies.   ROS:  Review of Systems  Constitutional:  Positive for diaphoresis. Negative for fever.  HENT:  Positive for congestion, rhinorrhea and sore throat.   Respiratory:  Positive for cough.   Gastrointestinal:  Positive for abdominal pain, nausea and vomiting.  Genitourinary:  Negative for vaginal bleeding.  Musculoskeletal:  Positive for myalgias.  Psychiatric/Behavioral:  Positive for dysphoric mood. The patient is nervous/anxious.    Physical Exam  Patient Vitals for the past 24 hrs:  BP Temp Temp src Pulse Resp SpO2 Height Weight  03/19/22 0810 115/71 98.2 F (36.8 C) Oral 89 17 95 % -- --  03/19/22 0443 130/78 98.2 F (36.8 C) Oral 96 20 -- 5\' 6"  (1.676 m) 224 lb (101.6 kg)   Constitutional: Well-developed, well-nourished female in acute distress, tearful at times.  Cardiovascular: normal rate & rhythm Respiratory: normal effort, no problems  noted with respiration but pt does have a cough that precipitates vomiting GI: Abd guarded, non-tender MS: Extremities nontender, no edema, normal ROM Neurologic: Alert and oriented x 4.  GU: no CVA tenderness Pelvic: exam deferred  FHR: 159   COWS Score: 22  Labs: Results for orders placed or performed during the hospital encounter of 03/19/22 (from the past 24 hour(s))  Urinalysis, Routine w reflex microscopic Urine, Clean Catch     Status: Abnormal   Collection Time: 03/19/22  4:55 AM  Result Value Ref Range   Color, Urine AMBER (A) YELLOW   APPearance CLOUDY (A) CLEAR   Specific Gravity, Urine 1.023 1.005 - 1.030   pH 5.0 5.0 - 8.0   Glucose, UA NEGATIVE NEGATIVE mg/dL   Hgb urine dipstick SMALL (A) NEGATIVE   Bilirubin Urine NEGATIVE NEGATIVE   Ketones, ur 80 (A) NEGATIVE mg/dL   Protein, ur 30 (A) NEGATIVE mg/dL   Nitrite NEGATIVE NEGATIVE   Leukocytes,Ua MODERATE (A) NEGATIVE   RBC / HPF 6-10 0 - 5 RBC/hpf   WBC, UA >50 (H) 0 - 5 WBC/hpf   Bacteria, UA FEW (A) NONE SEEN   Squamous Epithelial / LPF 11-20 0 - 5   Mucus PRESENT   CBC with Differential/Platelet     Status: Abnormal   Collection Time: 03/19/22  5:22 AM  Result Value Ref Range   WBC 7.0 4.0 - 10.5 K/uL   RBC 4.06 3.87 - 5.11 MIL/uL   Hemoglobin 10.2 (L) 12.0 - 15.0 g/dL   HCT 32.2 (L) 36.0 - 46.0 %   MCV 79.3 (L) 80.0 - 100.0 fL   MCH 25.1 (L) 26.0 - 34.0 pg   MCHC 31.7 30.0 - 36.0 g/dL   RDW 15.2 11.5 - 15.5 %   Platelets 211 150 - 400 K/uL   nRBC 0.0 0.0 - 0.2 %   Neutrophils Relative % 77 %   Neutro Abs 5.4 1.7 - 7.7 K/uL   Lymphocytes Relative 13 %   Lymphs Abs 0.9 0.7 - 4.0 K/uL   Monocytes Relative 7 %   Monocytes Absolute 0.5 0.1 - 1.0 K/uL   Eosinophils Relative 2 %   Eosinophils Absolute 0.1 0.0 - 0.5 K/uL   Basophils Relative 0 %   Basophils Absolute 0.0 0.0 - 0.1 K/uL   Immature Granulocytes 1 %   Abs Immature Granulocytes 0.04 0.00 - 0.07 K/uL  Comprehensive metabolic panel      Status: Abnormal   Collection Time: 03/19/22  5:22 AM  Result Value Ref Range   Sodium 138 135 - 145 mmol/L   Potassium 3.6 3.5 - 5.1 mmol/L   Chloride 106 98 - 111  mmol/L   CO2 21 (L) 22 - 32 mmol/L   Glucose, Bld 91 70 - 99 mg/dL   BUN <5 (L) 6 - 20 mg/dL   Creatinine, Ser 0.68 0.44 - 1.00 mg/dL   Calcium 9.2 8.9 - 10.3 mg/dL   Total Protein 6.9 6.5 - 8.1 g/dL   Albumin 2.9 (L) 3.5 - 5.0 g/dL   AST 12 (L) 15 - 41 U/L   ALT 9 0 - 44 U/L   Alkaline Phosphatase 64 38 - 126 U/L   Total Bilirubin 0.9 0.3 - 1.2 mg/dL   GFR, Estimated >60 >60 mL/min   Anion gap 11 5 - 15  Lipase, blood     Status: None   Collection Time: 03/19/22  5:22 AM  Result Value Ref Range   Lipase 37 11 - 51 U/L  Resp Panel by RT-PCR (Flu A&B, Covid) Anterior Nasal Swab     Status: None   Collection Time: 03/19/22  6:26 AM   Specimen: Anterior Nasal Swab  Result Value Ref Range   SARS Coronavirus 2 by RT PCR NEGATIVE NEGATIVE   Influenza A by PCR NEGATIVE NEGATIVE   Influenza B by PCR NEGATIVE NEGATIVE   Imaging:  No results found.  MAU Course: Orders Placed This Encounter  Procedures   Resp Panel by RT-PCR (Flu A&B, Covid) Anterior Nasal Swab   Culture, OB Urine   Urinalysis, Routine w reflex microscopic Urine, Clean Catch   CBC with Differential/Platelet   Comprehensive metabolic panel   Lipase, blood   Airborne and Contact precautions   Meds ordered this encounter  Medications   lactated ringers bolus 1,000 mL   ondansetron (ZOFRAN-ODT) disintegrating tablet 8 mg   famotidine (PEPCID) IVPB 20 mg premix   acetaminophen (OFIRMEV) IV 1,000 mg    Order Specific Question:   Is the patient UNABLE to take oral / enteral medications?    Answer:   Yes   DISCONTD: buprenorphine-naloxone (SUBOXONE) 8-2 mg per SL tablet 1 tablet   buprenorphine-naloxone (SUBOXONE) 8-2 mg per SL tablet 1 tablet   lactated ringers bolus 1,000 mL   phenol (CHLORASEPTIC) mouth spray 1 spray   dextromethorphan-guaiFENesin  (MUCINEX DM) 30-600 MG per 12 hr tablet 1 tablet   Buprenorphine HCl-Naloxone HCl 8-2 MG FILM    Sig: Place 1 Film under the tongue daily for 10 days.    Dispense:  10 each    Refill:  0    Order Specific Question:   Supervising Provider    Answer:   Merrily Pew   MDM: Covid swab, LR bolus, zofran, pepcid and tylenol IV ordered for patient. After reviewing her chart, discussed possibility of opioid withdrawal being responsible for some of her symptoms. Pt tearfully agreed she feels like she is in withdrawal and is ready to be off oxycodone completely.   Consulted Dr. Dione Plover who recommended quick-start of suboxone. Pt was taking 7.5mg -10mg  3-4x/day, last dose was 2 days ago. Instructed by Dr. Dione Plover to give Suboxone 8-2mg  SL now, reassess in an hour and give another 4-8mg  if needed, then discharge on 12mg  daily with quick follow up with him at [facility] if patient is amenable.  All symptoms but post-nasal drip related cough resolved well with first dose of suboxone. Discussed switching to our practice for OUD management with patient and she was amenable. Planning to attend 20wk ultrasound and come to visit with Dr. Nehemiah Settle on July 5 at 1:55pm. Will prescribe enough suboxone to get her to that  appointment (per Dr. Crissie Reese).  Throat still sore with coughing, ordered Mucinex DM and chloraseptic for patient. Stable for discharge.  Assessment: 1. Opioid use disorder   2. Post-nasal drip   3. [redacted] weeks gestation of pregnancy    Plan: Discharge home in stable condition with close follow up at [facility] and return precautions    Follow-up Information     [facility] Follow up in 8 day(s).   Specialty: Obstetrics and Gynecology Why: at 1:55pm with Dr. Alfonso Ellis information: [address] [place] [address] [phone]                Allergies as of 03/19/2022   No Known Allergies      Medication  List     STOP taking these medications    bisacodyl 10 MG suppository Commonly known as: Dulcolax   polyethylene glycol powder 17 GM/SCOOP powder Commonly known as: GLYCOLAX/MIRALAX   TERCONAZOLE VA       TAKE these medications    acetaminophen 325 MG tablet Commonly known as: TYLENOL Take 650 mg by mouth every 6 (six) hours as needed. Pain   Buprenorphine HCl-Naloxone HCl 8-2 MG Film Place 1 Film under the tongue daily for 10 days.   ferrous sulfate 325 (65 FE) MG tablet Take 325 mg by mouth daily with breakfast.   prenatal multivitamin Tabs tablet Take 1 tablet by mouth daily.       Edd Arbour, CNM, MSN, IBCLC Certified Nurse Midwife, [facility]

## 2022-03-20 LAB — CULTURE, OB URINE

## 2022-03-27 ENCOUNTER — Ambulatory Visit (INDEPENDENT_AMBULATORY_CARE_PROVIDER_SITE_OTHER): Payer: Medicaid Other | Admitting: Family Medicine

## 2022-03-27 ENCOUNTER — Other Ambulatory Visit (HOSPITAL_COMMUNITY)
Admission: RE | Admit: 2022-03-27 | Discharge: 2022-03-27 | Disposition: A | Discharge status: Home / Self Care | Payer: Medicaid Other | Source: Ambulatory Visit | Attending: Family Medicine | Admitting: Family Medicine

## 2022-03-27 ENCOUNTER — Other Ambulatory Visit: Payer: Self-pay

## 2022-03-27 VITALS — BP 124/84 | HR 97 | Wt 221.0 lb

## 2022-03-27 DIAGNOSIS — Z3A19 19 weeks gestation of pregnancy: Secondary | ICD-10-CM | POA: Diagnosis not present

## 2022-03-27 DIAGNOSIS — O0990 Supervision of high risk pregnancy, unspecified, unspecified trimester: Secondary | ICD-10-CM | POA: Insufficient documentation

## 2022-03-27 DIAGNOSIS — F1190 Opioid use, unspecified, uncomplicated: Secondary | ICD-10-CM

## 2022-03-27 DIAGNOSIS — F321 Major depressive disorder, single episode, moderate: Secondary | ICD-10-CM

## 2022-03-27 HISTORY — DX: Opioid use, unspecified, uncomplicated: F11.90

## 2022-03-27 MED ORDER — BUPRENORPHINE HCL-NALOXONE HCL 8-2 MG SL FILM: 1.0000 | ORAL_FILM | Freq: Every day | SUBLINGUAL | 0 refills | Status: DC

## 2022-03-27 MED ORDER — ONDANSETRON 4 MG PO TBDP: 4.0000 mg | ORAL_TABLET | Freq: Two times a day (BID) | ORAL | 3 refills | Status: DC

## 2022-03-27 NOTE — Progress Notes (Signed)
Subjective:  Tracey Ho is a [id number] [redacted]w[redacted]d being seen today for her first obstetrical visit. She was previously being seen by a [facility] practice, but was needing to switch due to OUD. Her obstetrical history is significant for  2 prior vaginal deliveries . She was started on suboxone 8mg  daily on 6/27. She's been taking 3/4 of a strip per day and doing fairly well with that. Patient does intend to breast feed. Pregnancy history fully reviewed.  Patient reports no complaints.  BP 124/84   Pulse 97   Wt 221 lb (100.2 kg)   LMP 11/02/2021   BMI 35.67 kg/m   HISTORY: OB History  Gravida Para Term Preterm AB Living  5 2 2  0 2 2  SAB IAB Ectopic Multiple Live Births  2 0 0 0 2    # Outcome Date GA Lbr Len/2nd Weight Sex Delivery Anes PTL Lv  5 Current           4 Term 2017     Vag-Spont   LIV  3 Term 2016     Vag-Spont   LIV     Birth Comments: System Generated. Please review and update pregnancy details.  2 SAB 2014          1 SAB 2014            Past Medical History:  Diagnosis Date   ADHD (attention deficit hyperactivity disorder)    Anemia    Migraine headache     Past Surgical History:  Procedure Laterality Date   NERVE SURGERY     NO PAST SURGERIES      Family History  Problem Relation Age of Onset   Thyroid disease Mother      Exam  BP 124/84   Pulse 97   Wt 221 lb (100.2 kg)   LMP 11/02/2021   BMI 35.67 kg/m   Chaperone present during exam  CONSTITUTIONAL: Well-developed, well-nourished female in no acute distress.  HENT:  Normocephalic, atraumatic, External right and left ear normal. Oropharynx is clear and moist EYES: Conjunctivae and EOM are normal. Pupils are equal, round, and reactive to light. No scleral icterus.  NECK: Normal range of motion, supple, no masses.  Normal thyroid.  CARDIOVASCULAR: Normal heart rate noted, regular rhythm RESPIRATORY: Clear to auscultation bilaterally. Effort and breath sounds normal, no problems with respiration  noted. BREASTS: deferred ABDOMEN: Soft, normal bowel sounds, no distention noted.  No tenderness, rebound or guarding.  PELVIC: Normal appearing external genitalia; normal appearing vaginal mucosa and cervix. No abnormal discharge noted. MUSCULOSKELETAL: Normal range of motion. No tenderness.  No cyanosis, clubbing, or edema.  2+ distal pulses. SKIN: Skin is warm and dry. No rash noted. Not diaphoretic. No erythema. No pallor. NEUROLOGIC: Alert and oriented to person, place, and time. Normal reflexes, muscle tone coordination. No cranial nerve deficit noted. PSYCHIATRIC: Normal mood and affect. Normal behavior. Normal judgment and thought content.    Assessment:    Pregnancy: 2015 Patient Active Problem List   Diagnosis Date Noted   Supervision of high risk pregnancy, antepartum 03/27/2022   Opioid use disorder 03/27/2022   MDD (major depressive disorder), single episode, moderate (HCC) 08/04/2012   ADHD (attention deficit hyperactivity disorder), combined type 08/04/2012   ODD (oppositional defiant disorder) 08/04/2012   Cannabis abuse 08/04/2012      Plan:   1. Supervision of high risk pregnancy, antepartum FHT and FH normal Contemplating PP BTL - AFP, Serum, Open Spina Bifida - CBC/D/Plt+RPR+Rh+ABO+RubIgG... - Hemoglobin A1c -  [place] MFM OB DETAIL +14 WK; Future - ToxASSURE Select 13 (MW), Urine - Cytology - PAP( [place])  2. Opioid use disorder Continue with suboxone. Discussed NAS Check Toxassure today. - ToxASSURE Select 13 (MW), Urine  3. MDD (major depressive disorder), single episode, moderate (HCC) On zoloft.  Problem list reviewed and updated. 75% of 30 min visit spent on counseling and coordination of care.     Levie Heritage 03/27/2022

## 2022-03-27 NOTE — Progress Notes (Signed)
Patient stated that she received genetic screening around 11 weeks at [facility]

## 2022-03-28 LAB — CBC/D/PLT+RPR+RH+ABO+RUBIGG...
Antibody Screen: NEGATIVE
Basophils Absolute: 0 10*3/uL (ref 0.0–0.2)
Basos: 0 %
EOS (ABSOLUTE): 0.1 10*3/uL (ref 0.0–0.4)
Eos: 3 %
HCV Ab: NONREACTIVE
HIV Screen 4th Generation wRfx: NONREACTIVE
Hematocrit: 31.6 % — ABNORMAL LOW (ref 34.0–46.6)
Hemoglobin: 10.2 g/dL — ABNORMAL LOW (ref 11.1–15.9)
Hepatitis B Surface Ag: NEGATIVE
Immature Grans (Abs): 0 10*3/uL (ref 0.0–0.1)
Immature Granulocytes: 1 %
Lymphocytes Absolute: 1.2 10*3/uL (ref 0.7–3.1)
Lymphs: 27 %
MCH: 24.6 pg — ABNORMAL LOW (ref 26.6–33.0)
MCHC: 32.3 g/dL (ref 31.5–35.7)
MCV: 76 fL — ABNORMAL LOW (ref 79–97)
Monocytes Absolute: 0.4 10*3/uL (ref 0.1–0.9)
Monocytes: 8 %
Neutrophils Absolute: 2.7 10*3/uL (ref 1.4–7.0)
Neutrophils: 61 %
Platelets: 199 10*3/uL (ref 150–450)
RBC: 4.14 x10E6/uL (ref 3.77–5.28)
RDW: 16 % — ABNORMAL HIGH (ref 11.7–15.4)
RPR Ser Ql: NONREACTIVE
Rh Factor: NEGATIVE
Rubella Antibodies, IGG: 4.47 index (ref >0.99)
WBC: 4.5 10*3/uL (ref 3.4–10.8)

## 2022-03-28 LAB — CYTOLOGY - PAP
Chlamydia: NEGATIVE
Comment: NEGATIVE
Comment: NEGATIVE
Comment: NORMAL
Diagnosis: NEGATIVE
Neisseria Gonorrhea: NEGATIVE
Trichomonas: NEGATIVE

## 2022-03-28 LAB — HCV INTERPRETATION

## 2022-03-29 LAB — AFP, SERUM, OPEN SPINA BIFIDA
AFP MoM: 1
AFP Value: 47.2 ng/mL
Gest. Age on Collection Date: 19.5 weeks
Maternal Age At EDD: 24.1 yr
OSBR Risk 1 IN: 10000
Test Results:: NEGATIVE
Weight: 221 [lb_av]

## 2022-03-29 LAB — HEMOGLOBIN A1C
Est. average glucose Bld gHb Est-mCnc: 114 mg/dL
Hgb A1c MFr Bld: 5.6 % (ref 4.8–5.6)

## 2022-03-30 LAB — TOXASSURE SELECT 13 (MW), URINE

## 2022-04-01 ENCOUNTER — Encounter: Payer: Self-pay | Admitting: *Deleted

## 2022-04-01 NOTE — Progress Notes (Unsigned)
Per Pregnancy Navigator Elantra, Pt reported history of Self Harm, hasn't done in anything in a few years. She advised that she didn't let staff know because it was already noted in her chart.

## 2022-04-04 ENCOUNTER — Encounter: Payer: Self-pay | Admitting: Family Medicine

## 2022-04-04 MED ORDER — BUPRENORPHINE HCL-NALOXONE HCL 4-1 MG SL FILM: 1.0000 | ORAL_FILM | Freq: Three times a day (TID) | SUBLINGUAL | 0 refills | Status: DC

## 2022-04-04 NOTE — Telephone Encounter (Signed)
Patient having increased withdrawal and has been titrating her dose of Suboxone.  She is currently taking 1-1/2 strips per day broken up 3 times a day.  We will prescribe 4 mg strips to take 3 times a day

## 2022-04-17 ENCOUNTER — Other Ambulatory Visit: Payer: Self-pay | Admitting: *Deleted

## 2022-04-17 ENCOUNTER — Ambulatory Visit: Payer: Medicaid Other | Admitting: *Deleted

## 2022-04-17 ENCOUNTER — Encounter: Payer: Self-pay | Admitting: *Deleted

## 2022-04-17 ENCOUNTER — Other Ambulatory Visit: Payer: Self-pay

## 2022-04-17 ENCOUNTER — Ambulatory Visit (INDEPENDENT_AMBULATORY_CARE_PROVIDER_SITE_OTHER): Payer: Medicaid Other | Admitting: Certified Nurse Midwife

## 2022-04-17 ENCOUNTER — Ambulatory Visit: Payer: Medicaid Other | Attending: Family Medicine

## 2022-04-17 VITALS — BP 113/66 | HR 96

## 2022-04-17 DIAGNOSIS — Z3A22 22 weeks gestation of pregnancy: Secondary | ICD-10-CM

## 2022-04-17 DIAGNOSIS — R600 Localized edema: Secondary | ICD-10-CM

## 2022-04-17 DIAGNOSIS — O0992 Supervision of high risk pregnancy, unspecified, second trimester: Secondary | ICD-10-CM

## 2022-04-17 DIAGNOSIS — F1190 Opioid use, unspecified, uncomplicated: Secondary | ICD-10-CM

## 2022-04-17 DIAGNOSIS — O0990 Supervision of high risk pregnancy, unspecified, unspecified trimester: Secondary | ICD-10-CM | POA: Diagnosis present

## 2022-04-17 DIAGNOSIS — R638 Other symptoms and signs concerning food and fluid intake: Secondary | ICD-10-CM

## 2022-04-17 DIAGNOSIS — F1120 Opioid dependence, uncomplicated: Secondary | ICD-10-CM

## 2022-04-18 NOTE — Progress Notes (Signed)
Rescheduled by pt

## 2022-04-24 ENCOUNTER — Ambulatory Visit (INDEPENDENT_AMBULATORY_CARE_PROVIDER_SITE_OTHER): Payer: Medicaid Other | Admitting: Certified Nurse Midwife

## 2022-04-24 DIAGNOSIS — F1190 Opioid use, unspecified, uncomplicated: Secondary | ICD-10-CM

## 2022-04-24 DIAGNOSIS — O0992 Supervision of high risk pregnancy, unspecified, second trimester: Secondary | ICD-10-CM

## 2022-04-24 DIAGNOSIS — Z3A23 23 weeks gestation of pregnancy: Secondary | ICD-10-CM

## 2022-04-24 NOTE — Progress Notes (Signed)
Did not come to appt 

## 2022-05-02 ENCOUNTER — Telehealth (INDEPENDENT_AMBULATORY_CARE_PROVIDER_SITE_OTHER): Payer: Medicaid Other | Admitting: Obstetrics and Gynecology

## 2022-05-02 ENCOUNTER — Encounter: Payer: Self-pay | Admitting: Obstetrics and Gynecology

## 2022-05-02 DIAGNOSIS — Z3A24 24 weeks gestation of pregnancy: Secondary | ICD-10-CM

## 2022-05-02 DIAGNOSIS — O99323 Drug use complicating pregnancy, third trimester: Secondary | ICD-10-CM

## 2022-05-02 DIAGNOSIS — O0990 Supervision of high risk pregnancy, unspecified, unspecified trimester: Secondary | ICD-10-CM

## 2022-05-02 DIAGNOSIS — O0993 Supervision of high risk pregnancy, unspecified, third trimester: Secondary | ICD-10-CM

## 2022-05-02 DIAGNOSIS — Z3009 Encounter for other general counseling and advice on contraception: Secondary | ICD-10-CM

## 2022-05-02 DIAGNOSIS — F1190 Opioid use, unspecified, uncomplicated: Secondary | ICD-10-CM

## 2022-05-02 HISTORY — DX: Encounter for other general counseling and advice on contraception: Z30.09

## 2022-05-02 MED ORDER — BLOOD PRESSURE MONITORING DEVI: 1.0000 | 0 refills | Status: DC

## 2022-05-02 NOTE — Progress Notes (Signed)
Pt does not have a BP Cuff, will order her one.

## 2022-05-02 NOTE — Progress Notes (Signed)
OBSTETRICS PRENATAL VIRTUAL VISIT ENCOUNTER NOTE  Provider location: Center for [facility]   Patient location: Home  I connected with Tracey Ho on 05/02/22 at  1:35 PM EDT by MyChart Video Encounter and verified that I am speaking with the correct person using two identifiers. I discussed the limitations, risks, security and privacy concerns of performing an evaluation and management service virtually and the availability of in person appointments. I also discussed with the patient that there may be a patient responsible charge related to this service. The patient expressed understanding and agreed to proceed. Subjective:  Tracey Ho is a 26 y.o. A5W0981 at [redacted]w[redacted]d being seen today for ongoing prenatal care.  She is currently monitored for the following issues for this high-risk pregnancy and has MDD (major depressive disorder), single episode, moderate (HCC); ADHD (attention deficit hyperactivity disorder), combined type; ODD (oppositional defiant disorder); Cannabis abuse; Supervision of high risk pregnancy, antepartum; Opioid use disorder; and Unwanted fertility on their problem list.  Patient reports no complaints.  Contractions: Not present. Vag. Bleeding: None.  Movement: Present. Denies any leaking of fluid.   The following portions of the patient's history were reviewed and updated as appropriate: allergies, current medications, past family history, past medical history, past social history, past surgical history and problem list.   Objective:  There were no vitals filed for this visit.  Fetal Status:     Movement: Present     General:  Alert, oriented and cooperative. Patient is in no acute distress.  Respiratory: Normal respiratory effort, no problems with respiration noted  Mental Status: Normal mood and affect. Normal behavior. Normal judgment and thought content.  Rest of physical exam deferred due to type of encounter  Imaging: [place] MFM OB  DETAIL +14 WK  Result Date: 04/17/2022 ----------------------------------------------------------------------  OBSTETRICS REPORT                       (Signed Final 04/17/2022 02:44 pm) ---------------------------------------------------------------------- Patient Info  ID #:       [id number]                          D.O.B.:  12-30-1995 (25 yrs)  Name:       Tracey Ho                    Visit Date: 04/17/2022 12:54 pm ---------------------------------------------------------------------- Performed By  Attending:        Noralee Space MD        Ref. Address:     [address]                                                             [place], [place]                                                             [address]  Performed By:     Tracey Ho     [facility]  Referred By:      Tracey Ho                    Tracey Ho                    CNM ---------------------------------------------------------------------- Orders  #  Description                           Code        Ordered By  1  US MFM OB DETAIL +14 WK               76811.01    Tracey Ho ----------------------------------------------------------------------  #  Order #                     Accession #                Episode #  1  [id number]                   [id number]                 [id number] ---------------------------------------------------------------------- Indications  Suboxone use                                   O99.320  Obesity complicating pregnancy (BMI 36)        O99.210 E66.9  [redacted] weeks gestation of pregnancy                Z3A.22  Encounter for antenatal screening for          Z36.3  malformations  AFP Negative; LR NIPS ---------------------------------------------------------------------- Fetal  Evaluation  Num Of Fetuses:         1  Fetal Heart Rate(bpm):  165  Cardiac Activity:       Observed  Presentation:           Cephalic  Placenta:               Anterior  P. Cord Insertion:      Visualized, central  Amniotic Fluid  AFI FV:      Within normal limits                              Largest Pocket(cm)  5.18 ---------------------------------------------------------------------- Biometry  BPD:     52.11  mm     G. Age:  21w 6d         15  %    CI:           72   %    70 - 86                                                          FL/HC:      21.0   %    19.2 - 20.8  HC:    195.44   mm     G. Age:  21w 5d          8  %    HC/AC:      1.09        1.05 - 1.21  AC:    178.76   mm     G. Age:  22w 5d         43  %    FL/BPD:     78.8   %    71 - 87  FL:      41.05  mm     G. Age:  23w 2d         59  %    FL/AC:      23.0   %    20 - 24  HUM:      40.5  mm     G. Age:  24w 4d         88  %  CER:      24.3  mm     G. Age:  22w 2d         63  %  LV:        5.2  mm  CM:        5.8  mm  Est. FW:     538  gm      1 lb 3 oz     48  % ---------------------------------------------------------------------- OB History  Gravidity:    5         Term:   2        Prem:   0        SAB:   2  TOP:          0       Ectopic:  0        Living: 2 ---------------------------------------------------------------------- Gestational Age  U/S Today:     22w 3d                                        EDD:   08/18/22  Best:          22w 5d     Det. ByMarcella Dubs         EDD:   08/16/22                                      (01/08/22) ---------------------------------------------------------------------- Anatomy  Cranium:  Appears normal         LVOT:                   Appears normal  Cavum:                 Appears normal         Aortic Arch:            Appears normal  Ventricles:            Appears normal         Ductal Arch:            Not well visualized  Choroid Plexus:        Appears normal          Diaphragm:              Appears normal  Cerebellum:            Appears normal         Stomach:                Appears normal, left                                                                        sided  Posterior Fossa:       Appears normal         Abdomen:                Appears normal  Nuchal Fold:           Not applicable (>20    Abdominal Wall:         Appears nml (cord                         wks GA)                                        insert, abd wall)  Face:                  Appears normal         Cord Vessels:           Appears normal (3                         (orbits and profile)                           vessel cord)  Lips:                  Appears normal         Kidneys:                Appear normal  Palate:                Not well visualized    Bladder:                Appears normal  Thoracic:  Appears normal         Spine:                  Appears normal  Heart:                 Appears normal         Upper Extremities:      Visualized                         (4CH, axis, and                         situs)  RVOT:                  Appears normal         Lower Extremities:      Visualized  Other:  Fetus appears to be a female. Heels visualized. Nasal bone visualized.          Hands and feet not well visualized.  3VTV visualized. ---------------------------------------------------------------------- Cervix Uterus Adnexa  Cervix  Length:            3.6  cm.  Normal appearance by transabdominal scan.  Right Ovary  Size(cm)     3.82   x   2.55   x  1.66      Vol(ml): 8.47  Within normal limits.  Left Ovary  Size(cm)     2.59   x   1.5    x  1.8       Vol(ml): 3.66  Within normal limits.  Adnexa  No abnormality visualized. ---------------------------------------------------------------------- Impression  G5 P2.  Patient is here for fetal anatomy scan. On cell-free  fetal DNA screening, the risks of aneuploidies are not  increased. MSAFP screening showed low risk for open-neural  tube  defects.  Obstetrical history is significant for 2 term vaginal deliveries.  She is on Suboxone maintenance.  We performed a fetal anatomy scan. No markers of  aneuploidies or fetal structural defects are seen. Fetal  biometry is consistent with her previously-established dates.  Amniotic fluid is normal and good fetal activity is seen.  Patient understands the limitations of ultrasound in detecting  fetal anomalies. ---------------------------------------------------------------------- Recommendations  -An appointment was made for her to return in 6 weeks for  fetal growth assessment and completion of fetal anatomy. ----------------------------------------------------------------------                 Tracey Space, MD Electronically Signed Final Report   04/17/2022 02:44 pm ----------------------------------------------------------------------   Assessment and Plan:  Pregnancy: B0F7510 at [redacted]w[redacted]d 1. Supervision of high risk pregnancy, antepartum Stable Glucola next visit Growth scanordered  2. Unwanted fertility BTL papers at next [facility] visit  3. Opioid use disorder Stable Continue with Suboxone. NAS referral at 32 weeksa  Preterm labor symptoms and general obstetric precautions including but not limited to vaginal bleeding, contractions, leaking of fluid and fetal movement were reviewed in detail with the patient. I discussed the assessment and treatment plan with the patient. The patient was provided an opportunity to ask questions and all were answered. The patient agreed with the plan and demonstrated an understanding of the instructions. The patient was advised to call back or seek an in-person office evaluation/go to MAU at [facility] for any urgent or concerning symptoms. Please refer to After Visit Summary for other counseling recommendations.   I provided 10 minutes of  face-to-face time during this encounter.  Return in about 4 weeks (around 05/30/2022) for OB visit, face  to face, any provider, fasting for Glucola.  Future Appointments  Date Time Provider Department Center  05/29/2022 12:30 PM [facility] NURSE [facility]  05/29/2022 12:45 PM [facility] US4 [facility]    Hermina Staggers, MD Center for [facility], [facility]

## 2022-05-02 NOTE — Addendum Note (Signed)
Addended by: Henrietta Dine on: 05/02/2022 02:38 PM   Modules accepted: Orders

## 2022-05-02 NOTE — Patient Instructions (Signed)

## 2022-05-03 ENCOUNTER — Encounter: Payer: Self-pay | Admitting: Family Medicine

## 2022-05-03 ENCOUNTER — Encounter: Payer: Self-pay | Admitting: Certified Nurse Midwife

## 2022-05-03 ENCOUNTER — Other Ambulatory Visit: Payer: Self-pay | Admitting: Family Medicine

## 2022-05-03 MED ORDER — BUPRENORPHINE HCL-NALOXONE HCL 4-1 MG SL FILM: 1.0000 | ORAL_FILM | Freq: Three times a day (TID) | SUBLINGUAL | 0 refills | Status: DC

## 2022-05-03 NOTE — Telephone Encounter (Signed)
Patient called and left VM on nurse line requesting more information regarding why her refill request was denied. Responded to patient via MyChart.

## 2022-05-07 NOTE — Telephone Encounter (Signed)
Refilled sent a different way. It was refilled on 8/11

## 2022-05-29 ENCOUNTER — Other Ambulatory Visit: Payer: Self-pay | Admitting: *Deleted

## 2022-05-29 ENCOUNTER — Ambulatory Visit: Payer: Medicaid Other | Admitting: *Deleted

## 2022-05-29 ENCOUNTER — Ambulatory Visit: Payer: Medicaid Other | Attending: Obstetrics and Gynecology

## 2022-05-29 VITALS — BP 126/73 | HR 96

## 2022-05-29 DIAGNOSIS — F1129 Opioid dependence with unspecified opioid-induced disorder: Secondary | ICD-10-CM

## 2022-05-29 DIAGNOSIS — O99320 Drug use complicating pregnancy, unspecified trimester: Secondary | ICD-10-CM | POA: Insufficient documentation

## 2022-05-29 DIAGNOSIS — E669 Obesity, unspecified: Secondary | ICD-10-CM | POA: Diagnosis not present

## 2022-05-29 DIAGNOSIS — F1120 Opioid dependence, uncomplicated: Secondary | ICD-10-CM | POA: Diagnosis present

## 2022-05-29 DIAGNOSIS — R638 Other symptoms and signs concerning food and fluid intake: Secondary | ICD-10-CM | POA: Insufficient documentation

## 2022-05-29 DIAGNOSIS — O99213 Obesity complicating pregnancy, third trimester: Secondary | ICD-10-CM

## 2022-05-29 DIAGNOSIS — Z3A28 28 weeks gestation of pregnancy: Secondary | ICD-10-CM

## 2022-05-29 DIAGNOSIS — O0990 Supervision of high risk pregnancy, unspecified, unspecified trimester: Secondary | ICD-10-CM | POA: Diagnosis present

## 2022-05-31 ENCOUNTER — Other Ambulatory Visit: Payer: Self-pay

## 2022-05-31 DIAGNOSIS — O0990 Supervision of high risk pregnancy, unspecified, unspecified trimester: Secondary | ICD-10-CM

## 2022-06-03 ENCOUNTER — Other Ambulatory Visit: Payer: Medicaid Other

## 2022-06-03 ENCOUNTER — Encounter: Payer: Medicaid Other | Admitting: Advanced Practice Midwife

## 2022-06-05 ENCOUNTER — Other Ambulatory Visit: Payer: Medicaid Other

## 2022-06-05 ENCOUNTER — Other Ambulatory Visit (HOSPITAL_COMMUNITY)
Admission: RE | Admit: 2022-06-05 | Discharge: 2022-06-05 | Disposition: A | Discharge status: Home / Self Care | Payer: Medicaid Other | Source: Ambulatory Visit | Attending: Advanced Practice Midwife | Admitting: Advanced Practice Midwife

## 2022-06-05 ENCOUNTER — Ambulatory Visit (INDEPENDENT_AMBULATORY_CARE_PROVIDER_SITE_OTHER): Payer: Medicaid Other | Admitting: Certified Nurse Midwife

## 2022-06-05 ENCOUNTER — Other Ambulatory Visit: Payer: Self-pay

## 2022-06-05 VITALS — BP 114/68 | HR 94 | Wt 229.0 lb

## 2022-06-05 DIAGNOSIS — N898 Other specified noninflammatory disorders of vagina: Secondary | ICD-10-CM

## 2022-06-05 DIAGNOSIS — F1190 Opioid use, unspecified, uncomplicated: Secondary | ICD-10-CM

## 2022-06-05 DIAGNOSIS — B3731 Acute candidiasis of vulva and vagina: Secondary | ICD-10-CM

## 2022-06-05 DIAGNOSIS — O0993 Supervision of high risk pregnancy, unspecified, third trimester: Secondary | ICD-10-CM

## 2022-06-05 DIAGNOSIS — O0990 Supervision of high risk pregnancy, unspecified, unspecified trimester: Secondary | ICD-10-CM

## 2022-06-05 DIAGNOSIS — Z3A29 29 weeks gestation of pregnancy: Secondary | ICD-10-CM

## 2022-06-06 LAB — CERVICOVAGINAL ANCILLARY ONLY
Bacterial Vaginitis (gardnerella): NEGATIVE
Candida Glabrata: NEGATIVE
Candida Vaginitis: POSITIVE — AB
Chlamydia: NEGATIVE
Comment: NEGATIVE
Comment: NEGATIVE
Comment: NEGATIVE
Comment: NEGATIVE
Comment: NEGATIVE
Comment: NORMAL
Neisseria Gonorrhea: NEGATIVE
Trichomonas: NEGATIVE

## 2022-06-06 LAB — GLUCOSE TOLERANCE, 2 HOURS W/ 1HR
Glucose, 1 hour: 96 mg/dL (ref 70–179)
Glucose, 2 hour: 86 mg/dL (ref 70–152)
Glucose, Fasting: 77 mg/dL (ref 70–91)

## 2022-06-06 LAB — CBC
Hematocrit: 27.5 % — ABNORMAL LOW (ref 34.0–46.6)
Hemoglobin: 8.4 g/dL — ABNORMAL LOW (ref 11.1–15.9)
MCH: 24.4 pg — ABNORMAL LOW (ref 26.6–33.0)
MCHC: 30.5 g/dL — ABNORMAL LOW (ref 31.5–35.7)
MCV: 80 fL (ref 79–97)
Platelets: 198 10*3/uL (ref 150–450)
RBC: 3.44 x10E6/uL — ABNORMAL LOW (ref 3.77–5.28)
RDW: 15.2 % (ref 11.7–15.4)
WBC: 5 10*3/uL (ref 3.4–10.8)

## 2022-06-06 LAB — RPR: RPR Ser Ql: NONREACTIVE

## 2022-06-06 LAB — HIV ANTIBODY (ROUTINE TESTING W REFLEX): HIV Screen 4th Generation wRfx: NONREACTIVE

## 2022-06-07 MED ORDER — MICONAZOLE NITRATE 2 % EX CREA: 1.0000 | TOPICAL_CREAM | Freq: Two times a day (BID) | CUTANEOUS | 0 refills | Status: DC

## 2022-06-07 MED ORDER — TERCONAZOLE 0.4 % VA CREA: 1.0000 | TOPICAL_CREAM | Freq: Every day | VAGINAL | 0 refills | Status: DC

## 2022-06-07 NOTE — Progress Notes (Signed)
   PRENATAL VISIT NOTE  Subjective:  Tracey Ho is a 26 y.o. T5T7322 at [redacted]w[redacted]d being seen today for ongoing prenatal care.  She is currently monitored for the following issues for this high-risk pregnancy and has MDD (major depressive disorder), single episode, moderate (HCC); ADHD (attention deficit hyperactivity disorder), combined type; ODD (oppositional defiant disorder); Cannabis abuse; Supervision of high risk pregnancy, antepartum; Opioid use disorder; and Unwanted fertility on their problem list.  Patient reports vaginal irritation.  Contractions: Not present. Vag. Bleeding: None.  Movement: Present. Denies leaking of fluid.   The following portions of the patient's history were reviewed and updated as appropriate: allergies, current medications, past family history, past medical history, past social history, past surgical history and problem list.   Objective:   Vitals:   06/05/22 1007  BP: 114/68  Pulse: 94  Weight: 229 lb (103.9 kg)    Fetal Status:     Movement: Present     General:  Alert, oriented and cooperative. Patient is in no acute distress.  Skin: Skin is warm and dry. No rash noted.   Cardiovascular: Normal heart rate noted  Respiratory: Normal respiratory effort, no problems with respiration noted  Abdomen: Soft, gravid, appropriate for gestational age.  Pain/Pressure: Present     Pelvic: Pelvic exam and swabs collected in the presence of a chaperone . Thick, white discharge noted all over vulva and inside vagina. Vulva very irritated, also noted skin tags on inner thighs. Pt has a history of HPV but these do not have the typical cauliflower appearance and look like smooth skin tags  Extremities: Normal range of motion.  Edema: Trace  Mental Status: Normal mood and affect. Normal behavior. Normal judgment and thought content.   Assessment and Plan:  Pregnancy: G2R4270 at [redacted]w[redacted]d 1. Supervision of high risk pregnancy in third trimester - Doing well, feeling  regular and vigorous fetal movement   2. [redacted] weeks gestation of pregnancy - Routine OB care   3. Opioid use disorder - Currently stable on suboxone - notes some continued depressive moods as well as ongoing anxiety but feels she is managing it well  4. Vaginal discharge - Cervicovaginal ancillary only( [place])  5. Yeast vaginitis - terconazole (TERAZOL 7) 0.4 % vaginal cream; Place 1 applicator vaginally at bedtime.  Dispense: 45 g; Refill: 0 - miconazole (MICATIN) 2 % cream; Apply 1 Application topically 2 (two) times daily.  Dispense: 28.35 g; Refill: 0  Preterm labor symptoms and general obstetric precautions including but not limited to vaginal bleeding, contractions, leaking of fluid and fetal movement were reviewed in detail with the patient. Please refer to After Visit Summary for other counseling recommendations.   Return in about 2 weeks (around 06/19/2022) for [facility], IN-PERSON.  Future Appointments  Date Time Provider Department Center  06/19/2022  4:15 PM Venora Maples, MD [facility]  07/11/2022  1:15 PM [facility] NURSE [facility]  07/11/2022  1:30 PM [facility] US2 [facility]    Bernerd Limbo, CNM

## 2022-06-19 ENCOUNTER — Encounter: Payer: Medicaid Other | Admitting: Family Medicine

## 2022-06-20 ENCOUNTER — Telehealth: Payer: Self-pay

## 2022-06-20 NOTE — Telephone Encounter (Addendum)
-----   Message from Gabriel Carina, [place] sent at 06/20/2022 10:17 AM EDT ----- Regarding: FW: 28 week labs sent to me by mistake Can you reach out to this patient to see if she is able to go to infusions? She did not come to her appt with Dr. Dione Plover yesterday. Thank you!  ------------------------------  Called pt x 2; VM not set up. Called patient contact (mother) and left VM I am trying to reach patient. Gilford Rile, CNM notified.

## 2022-06-23 ENCOUNTER — Encounter (HOSPITAL_COMMUNITY): Payer: Self-pay | Admitting: Obstetrics and Gynecology

## 2022-06-23 ENCOUNTER — Inpatient Hospital Stay (HOSPITAL_COMMUNITY)
Admission: AD | Admit: 2022-06-23 | Discharge: 2022-06-23 | Disposition: A | Discharge status: Home / Self Care | Payer: Medicaid Other | Attending: Obstetrics and Gynecology | Admitting: Obstetrics and Gynecology

## 2022-06-23 ENCOUNTER — Encounter: Payer: Self-pay | Admitting: Certified Nurse Midwife

## 2022-06-23 DIAGNOSIS — R102 Pelvic and perineal pain: Secondary | ICD-10-CM | POA: Diagnosis not present

## 2022-06-23 DIAGNOSIS — B3731 Acute candidiasis of vulva and vagina: Secondary | ICD-10-CM

## 2022-06-23 DIAGNOSIS — O26893 Other specified pregnancy related conditions, third trimester: Secondary | ICD-10-CM | POA: Insufficient documentation

## 2022-06-23 DIAGNOSIS — O98813 Other maternal infectious and parasitic diseases complicating pregnancy, third trimester: Secondary | ICD-10-CM | POA: Insufficient documentation

## 2022-06-23 DIAGNOSIS — B379 Candidiasis, unspecified: Secondary | ICD-10-CM | POA: Diagnosis not present

## 2022-06-23 DIAGNOSIS — Z3A32 32 weeks gestation of pregnancy: Secondary | ICD-10-CM | POA: Diagnosis not present

## 2022-06-23 DIAGNOSIS — F1190 Opioid use, unspecified, uncomplicated: Secondary | ICD-10-CM

## 2022-06-23 LAB — URINALYSIS, ROUTINE W REFLEX MICROSCOPIC
Bilirubin Urine: NEGATIVE
Glucose, UA: NEGATIVE mg/dL
Hgb urine dipstick: NEGATIVE
Ketones, ur: NEGATIVE mg/dL
Nitrite: NEGATIVE
Protein, ur: NEGATIVE mg/dL
Specific Gravity, Urine: 1.016 (ref 1.005–1.030)
pH: 6 (ref 5.0–8.0)

## 2022-06-23 LAB — WET PREP, GENITAL
Sperm: NONE SEEN
Trich, Wet Prep: NONE SEEN
WBC, Wet Prep HPF POC: >=10 — AB (ref <10)
Yeast Wet Prep HPF POC: NONE SEEN

## 2022-06-23 MED ORDER — TERCONAZOLE 0.4 % VA CREA: 1.0000 | TOPICAL_CREAM | Freq: Every day | VAGINAL | 0 refills | Status: DC

## 2022-06-23 NOTE — MAU Provider Note (Signed)
History     CSN: [id number]  Arrival date and time: 06/23/22 1357   None     Chief Complaint  Patient presents with   Pelvic Pain   HPI Tracey Ho is a 26 y.o. T0W4097 at [redacted]w[redacted]d who presents to MAU for pelvic pressure. Patient reports last night she and her partner tried to have intercourse however had to stop as it "felt like something was pushing down and hitting something". She reports she tried to urinate however nothing came out and she just had a lot of pressure. Since then she has continued to have pelvic pressure. She denies vaginal bleeding, had a small amount of clear discharge come out last night. Was not enough to have to wear a pad and did not soak through her clothes. She reports she does not think her water broke. She denies itching, odor, or any urinary s/s. She reports some mild lower abdominal cramping, however this has been ongoing for weeks. She endorses normal active fetal movement.   Patient receives prenatal care at [facility].   OB History     Gravida  5   Para  2   Term  2   Preterm  0   AB  2   Living  2      SAB  2   IAB  0   Ectopic  0   Multiple  0   Live Births  2           Past Medical History:  Diagnosis Date   ADHD (attention deficit hyperactivity disorder)    Anemia    Migraine headache     Past Surgical History:  Procedure Laterality Date   NERVE SURGERY     NO PAST SURGERIES      Family History  Problem Relation Age of Onset   Hypertension Mother    Thyroid disease Mother    Stroke Maternal Aunt    Hypertension Maternal Aunt    Stroke Maternal Grandmother    Hypertension Maternal Grandmother    Asthma Neg Hx    Cancer Neg Hx    Birth defects Neg Hx    Heart disease Neg Hx     Social History   Tobacco Use   Smoking status: Former    Types: Cigars   Smokeless tobacco: Current  Vaping Use   Vaping Use: Every day   Substances: Nicotine  Substance Use Topics   Alcohol use: No    Comment: LAST TIME WAS 2  MTHS AGO   Drug use: Not Currently    Types: Marijuana    Comment: Last use Sept 2013    Allergies: No Known Allergies  Medications Prior to Admission  Medication Sig Dispense Refill Last Dose   Blood Pressure Monitoring DEVI 1 each by Does not apply route once a week. (Patient not taking: Reported on 06/05/2022) 1 each 0    Buprenorphine HCl-Naloxone HCl (SUBOXONE) 4-1 MG FILM Place 1 Film under the tongue 3 (three) times daily for 14 days. 42 each 0    ferrous sulfate 325 (65 FE) MG tablet Take 325 mg by mouth daily with breakfast.      miconazole (MICATIN) 2 % cream Apply 1 Application topically 2 (two) times daily. 28.35 g 0    Prenatal Vit-Fe Fumarate-FA (PRENATAL MULTIVITAMIN) TABS Take 1 tablet by mouth daily.      terconazole (TERAZOL 7) 0.4 % vaginal cream Place 1 applicator vaginally at bedtime. 45 g 0    valACYclovir (VALTREX) 500  MG tablet Take by mouth. (Patient not taking: Reported on 05/29/2022)       Review of Systems  Constitutional: Negative.   Respiratory: Negative.    Cardiovascular: Negative.   Gastrointestinal:  Positive for abdominal pain (cramping).  Genitourinary:  Positive for frequency, pelvic pain and vaginal discharge. Negative for dysuria and vaginal bleeding.  Musculoskeletal: Negative.   Neurological: Negative.    Physical Exam   Blood pressure 111/68, pulse 83, temperature 98.1 F (36.7 C), temperature source Oral, resp. rate 16, last menstrual period 11/02/2021, SpO2 100 %, unknown if currently breastfeeding.  Physical Exam Vitals and nursing note reviewed. Exam conducted with a chaperone present.  Constitutional:      General: She is not in acute distress. Eyes:     Extraocular Movements: Extraocular movements intact.     Pupils: Pupils are equal, round, and reactive to light.  Cardiovascular:     Rate and Rhythm: Normal rate.  Pulmonary:     Effort: Pulmonary effort is normal.  Abdominal:     Palpations: Abdomen is soft.     Tenderness:  There is no abdominal tenderness.     Comments: gravid  Genitourinary:    Comments: Normal external female genitalia, vaginal walls pink with rugae, small amount of thick white/yellow-tinged patches noted along vaginal walls and cervix c/w, cervix visually closed without lesion/masses Musculoskeletal:        General: Normal range of motion.     Cervical back: Normal range of motion.  Skin:    General: Skin is warm and dry.  Neurological:     General: No focal deficit present.     Mental Status: She is alert and oriented to person, place, and time.  Psychiatric:        Mood and Affect: Mood normal.        Behavior: Behavior normal.   Dilation: Closed Effacement (%): Thick Exam by:: Camelia Eng, CNM  NST FHR: 135 bpm, moderate variability, +15x15 accels, no decels Toco: quiet MAU Course  Procedures  MDM UA, culture pending Wet prep negative however on exam there is a copious amount of discharge c/w yeast so will treat patient accordingly. GC/CT pending Cervix closed and thick, no contractions on toco. Low suspicion for preterm labor. Patient reported a lot of hip pain-she was offered Tylenol/Flexeril, but declines.   Assessment and Plan  [redacted] weeks gestation Pelvic pain affecting pregnancy Yeast infection  - Discharge home in stable condition - Rx to pharmacy - Strict return precautions. Return to MAU as needed. - Keep OB appointment as scheduled   Brand Males, CNM 06/23/2022, 3:50 PM

## 2022-06-23 NOTE — MAU Note (Signed)
Pt reports to mau with c/o pelvic pressure that started around 11pm last night after leaving work. Pt reports when she got home she attempted to have sex but felt like something was pushing back so she stopped.  Pt reports constant pressure, increased urge to urinate.  Denies vag bleeding.  Reports one episode of  clear fluid leaking this morning. +FM

## 2022-06-24 LAB — CULTURE, OB URINE: Culture: NO GROWTH

## 2022-06-24 LAB — GC/CHLAMYDIA PROBE AMP (~~LOC~~) NOT AT ARMC
Chlamydia: NEGATIVE
Comment: NEGATIVE
Comment: NORMAL
Neisseria Gonorrhea: NEGATIVE

## 2022-06-24 MED ORDER — BUPRENORPHINE HCL-NALOXONE HCL 4-1 MG SL FILM: 1.0000 | ORAL_FILM | Freq: Three times a day (TID) | SUBLINGUAL | 0 refills | Status: DC

## 2022-07-03 ENCOUNTER — Ambulatory Visit (INDEPENDENT_AMBULATORY_CARE_PROVIDER_SITE_OTHER): Payer: Medicaid Other | Admitting: Certified Nurse Midwife

## 2022-07-03 DIAGNOSIS — Z3009 Encounter for other general counseling and advice on contraception: Secondary | ICD-10-CM

## 2022-07-03 DIAGNOSIS — F1190 Opioid use, unspecified, uncomplicated: Secondary | ICD-10-CM

## 2022-07-03 DIAGNOSIS — O99013 Anemia complicating pregnancy, third trimester: Secondary | ICD-10-CM

## 2022-07-03 DIAGNOSIS — O0993 Supervision of high risk pregnancy, unspecified, third trimester: Secondary | ICD-10-CM

## 2022-07-03 DIAGNOSIS — Z3A33 33 weeks gestation of pregnancy: Secondary | ICD-10-CM

## 2022-07-04 ENCOUNTER — Encounter: Payer: Self-pay | Admitting: Certified Nurse Midwife

## 2022-07-04 NOTE — Progress Notes (Signed)
Pt did not come to appt or call to cancel. Will reach out to patient via Andrew.

## 2022-07-11 ENCOUNTER — Ambulatory Visit: Payer: Medicaid Other | Attending: Obstetrics and Gynecology

## 2022-07-11 ENCOUNTER — Ambulatory Visit (INDEPENDENT_AMBULATORY_CARE_PROVIDER_SITE_OTHER): Payer: Medicaid Other | Admitting: Family Medicine

## 2022-07-11 ENCOUNTER — Ambulatory Visit: Payer: Medicaid Other | Admitting: *Deleted

## 2022-07-11 ENCOUNTER — Other Ambulatory Visit: Payer: Self-pay

## 2022-07-11 ENCOUNTER — Encounter: Payer: Self-pay | Admitting: Family Medicine

## 2022-07-11 VITALS — BP 132/80 | HR 105 | Wt 224.6 lb

## 2022-07-11 VITALS — BP 119/78 | HR 94

## 2022-07-11 DIAGNOSIS — O0990 Supervision of high risk pregnancy, unspecified, unspecified trimester: Secondary | ICD-10-CM

## 2022-07-11 DIAGNOSIS — F1129 Opioid dependence with unspecified opioid-induced disorder: Secondary | ICD-10-CM | POA: Insufficient documentation

## 2022-07-11 DIAGNOSIS — E669 Obesity, unspecified: Secondary | ICD-10-CM

## 2022-07-11 DIAGNOSIS — F902 Attention-deficit hyperactivity disorder, combined type: Secondary | ICD-10-CM

## 2022-07-11 DIAGNOSIS — O99213 Obesity complicating pregnancy, third trimester: Secondary | ICD-10-CM | POA: Diagnosis not present

## 2022-07-11 DIAGNOSIS — A6004 Herpesviral vulvovaginitis: Secondary | ICD-10-CM

## 2022-07-11 DIAGNOSIS — O99323 Drug use complicating pregnancy, third trimester: Secondary | ICD-10-CM

## 2022-07-11 DIAGNOSIS — Z3A34 34 weeks gestation of pregnancy: Secondary | ICD-10-CM

## 2022-07-11 DIAGNOSIS — F1190 Opioid use, unspecified, uncomplicated: Secondary | ICD-10-CM

## 2022-07-11 DIAGNOSIS — F321 Major depressive disorder, single episode, moderate: Secondary | ICD-10-CM

## 2022-07-11 MED ORDER — BUPRENORPHINE HCL-NALOXONE HCL 8-2 MG SL SUBL: 0.5000 | SUBLINGUAL_TABLET | Freq: Three times a day (TID) | SUBLINGUAL | 0 refills | Status: DC

## 2022-07-11 MED ORDER — VALACYCLOVIR HCL 1 G PO TABS: 1000.0000 mg | ORAL_TABLET | Freq: Every day | ORAL | 3 refills | Status: DC

## 2022-07-11 NOTE — Progress Notes (Signed)
PRENATAL VISIT NOTE  Subjective:  Tracey Ho is a 26 y.o. [id number] at [redacted]w[redacted]d being seen today for ongoing prenatal care.  She is currently monitored for the following issues for this high-risk pregnancy and has MDD (major depressive disorder), single episode, moderate ([place]); ADHD (attention deficit hyperactivity disorder), combined type; ODD (oppositional defiant disorder); Cannabis abuse; Supervision of high risk pregnancy, antepartum; Opioid use disorder; and Unwanted fertility on their problem list.  Patient reports no complaints.  Contractions: Irritability. Vag. Bleeding: None.  Movement: Present. Denies leaking of fluid.   The following portions of the patient's history were reviewed and updated as appropriate: allergies, current medications, past family history, past medical history, past social history, past surgical history and problem list.   Objective:   Vitals:   07/11/22 1147  BP: 132/80  Pulse: (!) 105  Weight: 224 lb 9.6 oz (101.9 kg)    Fetal Status: Fetal Heart Rate (bpm): 143 Fundal Height: 34 cm Movement: Present     General:  Alert, oriented and cooperative. Patient is in no acute distress.  Skin: Skin is warm and dry. No rash noted.   Cardiovascular: Normal heart rate noted  Respiratory: Normal respiratory effort, no problems with respiration noted  Abdomen: Soft, gravid, appropriate for gestational age.  Pain/Pressure: Present     Pelvic: Cervical exam deferred        Extremities: Normal range of motion.     Mental Status: Normal mood and affect. Normal behavior. Normal judgment and thought content.   Assessment and Plan:  Pregnancy: [id number] at [redacted]w[redacted]d 1. Supervision of high risk pregnancy, antepartum FH appropriated Rx sent for HSV ppx  2. Opioid use disorder Patient report using about two 10-15 oxycodone tablets for about 3 days  2-3 weeks ago. She also reports she smoked THC. She reports she is was "going through stuff" and stopped her suboxone  intentionally knowing the naloxone would counteract the opiates. She reports she then used opiates for about 2-3 days and noted needing more oxycodone for the same effect.    She has since started back on suboxone, last rx was filled in August and this was verified in [place]. She was not taking it TID like prescribed but has been taking this for the past 1-2 weeks since her relapse with oxycodone.  Discussed with client that I appreciate her openness and transparency with her addiction.  Patient does not like the films she they cause nausea/vomiting given texture. She reports she has vomited every time she uses the films. She desires pill forms. I have prescribed these today and discussed only a 14d supply.   Patient declined referral to LCSW  Separately the patient was asking about what to expect in the hospital for infant and CPS involvement. I discussed to expect a Education officer, museum to meet with her and a likely CPS referral but this is to trigger home assessments and while it does happen they are not likely to take custody as long as she is working with them and in transparent with her process. I discussed the engaging in care in the way she has today is also usually reassuring to SW/CPS.   Anticipated with patient eat/sleep/console in hospital and infant drug screening with urine and cord. Discussed possible need for treatment if infant goes through withdrawal. She voiced understanding and was appreciated having some foreknowledge of the process.   - ToxASSURE Select 13 (MW), Urine - buprenorphine-naloxone (SUBOXONE) 8-2 mg SUBL SL tablet; Place 0.5 tablets under the tongue 3 (  three) times daily for 14 days.  Dispense: 21 tablet; Refill: 0  3. MDD (major depressive disorder), single episode, moderate (HCC) Elevated PHQ9 but declined referral  4. ADHD (attention deficit hyperactivity disorder), combined type   5. Herpes simplex vulvovaginitis - valACYclovir (VALTREX) 1000 MG tablet; Take 1  tablet (1,000 mg total) by mouth daily.  Dispense: 30 tablet; Refill: 3   Preterm labor symptoms and general obstetric precautions including but not limited to vaginal bleeding, contractions, leaking of fluid and fetal movement were reviewed in detail with the patient. Please refer to After Visit Summary for other counseling recommendations.   Return in about 1 week (around 07/18/2022) for Routine prenatal care.  Future Appointments  Date Time Provider [place]  07/17/2022  1:35 PM Clarnce Flock, MD [facility]  07/24/2022 11:15 AM Clarnce Flock, MD [facility]  07/31/2022 11:15 AM Clarnce Flock, MD [facility]  08/07/2022 11:15 AM Clarnce Flock, MD [facility]  08/14/2022 11:15 AM Clarnce Flock, MD [facility]  08/21/2022  1:50 PM Clarnce Flock, MD [facility]  08/21/2022  3:15 PM [facility]    Caren Macadam, MD

## 2022-07-17 ENCOUNTER — Ambulatory Visit: Payer: Self-pay | Admitting: Certified Nurse Midwife

## 2022-07-17 DIAGNOSIS — Z3A35 35 weeks gestation of pregnancy: Secondary | ICD-10-CM

## 2022-07-17 DIAGNOSIS — Z3009 Encounter for other general counseling and advice on contraception: Secondary | ICD-10-CM

## 2022-07-17 DIAGNOSIS — F1190 Opioid use, unspecified, uncomplicated: Secondary | ICD-10-CM

## 2022-07-17 DIAGNOSIS — O0993 Supervision of high risk pregnancy, unspecified, third trimester: Secondary | ICD-10-CM

## 2022-07-20 LAB — TOXASSURE SELECT 13 (MW), URINE

## 2022-07-22 ENCOUNTER — Encounter: Payer: Self-pay | Admitting: Family Medicine

## 2022-07-22 DIAGNOSIS — O26899 Other specified pregnancy related conditions, unspecified trimester: Secondary | ICD-10-CM | POA: Insufficient documentation

## 2022-07-24 ENCOUNTER — Other Ambulatory Visit: Payer: Self-pay | Admitting: Family Medicine

## 2022-07-24 ENCOUNTER — Encounter: Payer: Self-pay | Admitting: Family Medicine

## 2022-07-24 DIAGNOSIS — F1190 Opioid use, unspecified, uncomplicated: Secondary | ICD-10-CM

## 2022-07-24 MED ORDER — BUPRENORPHINE HCL-NALOXONE HCL 8-2 MG SL SUBL: 1.0000 | SUBLINGUAL_TABLET | Freq: Two times a day (BID) | SUBLINGUAL | 0 refills | Status: DC

## 2022-07-24 NOTE — Progress Notes (Signed)
Spoke with patient on the phone, she has been taking suboxone 8 mg BID and doing well. Refill sent. Discussed importance of coming to clinic, missed appointment today, needs swabs.

## 2022-07-25 NOTE — Progress Notes (Signed)
Pt did not come to visit - needs rhogam and visit with prenatal navigator

## 2022-07-31 ENCOUNTER — Encounter: Payer: Self-pay | Admitting: Family Medicine

## 2022-07-31 ENCOUNTER — Other Ambulatory Visit: Payer: Self-pay

## 2022-07-31 ENCOUNTER — Other Ambulatory Visit (HOSPITAL_COMMUNITY)
Admission: RE | Admit: 2022-07-31 | Discharge: 2022-07-31 | Disposition: A | Discharge status: Home / Self Care | Payer: Medicaid Other | Source: Ambulatory Visit | Attending: Family Medicine | Admitting: Family Medicine

## 2022-07-31 ENCOUNTER — Ambulatory Visit (INDEPENDENT_AMBULATORY_CARE_PROVIDER_SITE_OTHER): Payer: Medicaid Other | Admitting: Family Medicine

## 2022-07-31 VITALS — BP 141/88 | HR 103 | Wt 226.2 lb

## 2022-07-31 DIAGNOSIS — F1190 Opioid use, unspecified, uncomplicated: Secondary | ICD-10-CM

## 2022-07-31 DIAGNOSIS — O0990 Supervision of high risk pregnancy, unspecified, unspecified trimester: Secondary | ICD-10-CM | POA: Diagnosis present

## 2022-07-31 DIAGNOSIS — B009 Herpesviral infection, unspecified: Secondary | ICD-10-CM

## 2022-07-31 DIAGNOSIS — O0993 Supervision of high risk pregnancy, unspecified, third trimester: Secondary | ICD-10-CM | POA: Diagnosis not present

## 2022-07-31 DIAGNOSIS — Z6791 Unspecified blood type, Rh negative: Secondary | ICD-10-CM

## 2022-07-31 DIAGNOSIS — Z3009 Encounter for other general counseling and advice on contraception: Secondary | ICD-10-CM

## 2022-07-31 DIAGNOSIS — A599 Trichomoniasis, unspecified: Secondary | ICD-10-CM

## 2022-07-31 DIAGNOSIS — O26899 Other specified pregnancy related conditions, unspecified trimester: Secondary | ICD-10-CM

## 2022-07-31 DIAGNOSIS — A6000 Herpesviral infection of urogenital system, unspecified: Secondary | ICD-10-CM

## 2022-07-31 MED ORDER — RHO D IMMUNE GLOBULIN 1500 UNIT/2ML IJ SOSY
300.0000 ug | PREFILLED_SYRINGE | Freq: Once | INTRAMUSCULAR | Status: AC
  Administered 2022-07-31: 300 ug via INTRAMUSCULAR

## 2022-07-31 MED ORDER — BUPRENORPHINE HCL-NALOXONE HCL 8-2 MG SL SUBL: 1.0000 | SUBLINGUAL_TABLET | Freq: Two times a day (BID) | SUBLINGUAL | 0 refills | Status: DC

## 2022-07-31 NOTE — Patient Instructions (Signed)

## 2022-07-31 NOTE — Progress Notes (Signed)
   Subjective:  Tracey Ho is a 26 y.o. [id number] at [redacted]w[redacted]d being seen today for ongoing prenatal care.  She is currently monitored for the following issues for this high-risk pregnancy and has MDD (major depressive disorder), single episode, moderate (HCC); ADHD (attention deficit hyperactivity disorder), combined type; ODD (oppositional defiant disorder); Cannabis abuse; Supervision of high risk pregnancy, antepartum; Opioid use disorder; Unwanted fertility; and Rh negative, antepartum on their problem list.  Patient reports no complaints.  Contractions: Irritability. Vag. Bleeding: None.  Movement: Present. Denies leaking of fluid.   The following portions of the patient's history were reviewed and updated as appropriate: allergies, current medications, past family history, past medical history, past social history, past surgical history and problem list. Problem list updated.  Objective:   Vitals:   07/31/22 1121  BP: (!) 141/88  Pulse: (!) 103  Weight: 226 lb 3.2 oz (102.6 kg)    Fetal Status: Fetal Heart Rate (bpm): 145   Movement: Present     General:  Alert, oriented and cooperative. Patient is in no acute distress.  Skin: Skin is warm and dry. No rash noted.   Cardiovascular: Normal heart rate noted  Respiratory: Normal respiratory effort, no problems with respiration noted  Abdomen: Soft, gravid, appropriate for gestational age. Pain/Pressure: Present     Pelvic: Vag. Bleeding: None     Cervical exam performed        Extremities: Normal range of motion.     Mental Status: Normal mood and affect. Normal behavior. Normal judgment and thought content.   Urinalysis:      Assessment and Plan:  Pregnancy: [id number] at [redacted]w[redacted]d  1. Supervision of high risk pregnancy, antepartum BP elevated, reports she has a headache and LE swelling Swabs collected Cervix checked, incredibly posterior so unable to get good cervical exam but baby is vertex and she's at least 1-2 cm dilated Sent to  MAU for further evaluation for possible PIH Report given by secure chat to MAU provider Given rhogam which she has not previously been given  - Culture, beta strep (group b only) - rho (d) immune globulin (RHIG/RHOPHYLAC) injection 300 mcg - Cervicovaginal ancillary only( [place])  2. Opioid use disorder Had relapse recently but doing well since Has been taking 8 mg BID, does not want to change dose currently UDS collected today - ToxASSURE Select 13 (MW), Urine  3. Rh negative, antepartum Rhogam given today  4. Unwanted fertility Wants BTL but no papers signed yet Signed today, understands she will need interval tubal Declines interval contraception  5. Recurrent genital herpes Just prior to performing cervical exam noted to have two suspicious lesions around clitoris, one appeared to be an HSV lesion that has not yet opened and the other had a classic punched out appearance and had opened. Swab collected Discussed with patient that given presence of herpes lesions C section would be indicated if she needs delivery today. If not urgent that she delivery today (ie if PreE workup is normal) then we could wait on swab to be sure but if not would still recommend cesarean  Term labor symptoms and general obstetric precautions including but not limited to vaginal bleeding, contractions, leaking of fluid and fetal movement were reviewed in detail with the patient. Please refer to After Visit Summary for other counseling recommendations.  Return in 1 week (on 08/07/2022) for [facility], ob visit, OUD f/u.   Venora Maples, MD

## 2022-08-01 LAB — CERVICOVAGINAL ANCILLARY ONLY
Bacterial Vaginitis (gardnerella): POSITIVE — AB
Candida Glabrata: NEGATIVE
Candida Vaginitis: NEGATIVE
Chlamydia: NEGATIVE
Comment: NEGATIVE
Comment: NEGATIVE
Comment: NEGATIVE
Comment: NEGATIVE
Comment: NEGATIVE
Comment: NORMAL
Neisseria Gonorrhea: NEGATIVE
Trichomonas: POSITIVE — AB

## 2022-08-01 MED ORDER — METRONIDAZOLE 500 MG PO TABS: 500.0000 mg | ORAL_TABLET | Freq: Two times a day (BID) | ORAL | 0 refills | Status: AC

## 2022-08-01 NOTE — Addendum Note (Signed)
Addended by: Merian Capron on: 08/01/2022 04:47 PM   Modules accepted: Orders

## 2022-08-02 ENCOUNTER — Encounter: Payer: Self-pay | Admitting: *Deleted

## 2022-08-02 LAB — HERPES SIMPLEX VIRUS CULTURE

## 2022-08-03 LAB — CULTURE, BETA STREP (GROUP B ONLY): Strep Gp B Culture: POSITIVE — AB

## 2022-08-05 ENCOUNTER — Telehealth: Payer: Self-pay | Admitting: Family Medicine

## 2022-08-05 ENCOUNTER — Encounter: Payer: Self-pay | Admitting: Family Medicine

## 2022-08-05 DIAGNOSIS — O99820 Streptococcus B carrier state complicating pregnancy: Secondary | ICD-10-CM | POA: Insufficient documentation

## 2022-08-05 LAB — TOXASSURE SELECT 13 (MW), URINE

## 2022-08-05 NOTE — Progress Notes (Signed)
GBS+, problem list updated  Unfortunately HSV swab from suspicious lesions on genitalia was also positive. I called patient by telephone and discussed the results as well as recommendation for primary cesarean to avoid neonatal transmission. She is disappointed but understanding.    Message sent to have her scheduled for this Saturday (08/10/22) when she is [redacted]w[redacted]d as there is no OR time available the day prior. FYI to Dr. Shawnie Pons who is on that day.

## 2022-08-05 NOTE — Telephone Encounter (Signed)
Spoke to patient again as she called back the phone in the first attending office, see prior documentation attached to encounter from 07/31/2022 where we discussed positive genital HSV cultures and need for primary cesarean which was scheduled for this weekend.   She asked if it was possible to deliver at [facility]. We discussed that our group does not have admitting privileges there and this is mostly dependent on the medical group at which a patient receives prenatal care. We also discussed it is unlikely that a group that does deliver at [facility] would accept a transfer this late in care. All questions answered. [facility]22.

## 2022-08-06 ENCOUNTER — Encounter (HOSPITAL_COMMUNITY): Payer: Self-pay

## 2022-08-06 ENCOUNTER — Telehealth (HOSPITAL_COMMUNITY): Payer: Self-pay | Admitting: *Deleted

## 2022-08-06 ENCOUNTER — Encounter (HOSPITAL_COMMUNITY): Payer: Self-pay | Admitting: Obstetrics & Gynecology

## 2022-08-06 DIAGNOSIS — O98313 Other infections with a predominantly sexual mode of transmission complicating pregnancy, third trimester: Secondary | ICD-10-CM | POA: Diagnosis present

## 2022-08-06 DIAGNOSIS — F1120 Opioid dependence, uncomplicated: Secondary | ICD-10-CM

## 2022-08-06 DIAGNOSIS — A6009 Herpesviral infection of other urogenital tract: Secondary | ICD-10-CM | POA: Diagnosis present

## 2022-08-06 NOTE — Patient Instructions (Signed)
Greidy Sherard  08/06/2022   Your procedure is scheduled on:  08/10/2022  Arrive at 0730 at [employer] [facility] on [address] at [facility]  and [employer]. You are invited to use the FREE valet parking or use the Visitor's parking deck.  Pick up the phone at the desk and dial [phone].  Call this number if you have problems the morning of surgery: [phone]  Remember:   Do not eat food:(After Midnight) Desps de medianoche.  Do not drink clear liquids: (After Midnight) Desps de medianoche.  Take these medicines the morning of surgery with A SIP OF WATER:  Suboxone and valtrex   Do not wear jewelry, make-up or nail polish.  Do not wear lotions, powders, or perfumes. Do not wear deodorant.  Do not shave 48 hours prior to surgery.  Do not bring valuables to the hospital.  [facility] is not   responsible for any belongings or valuables brought to the hospital.  Contacts, dentures or bridgework may not be worn into surgery.  Leave suitcase in the car. After surgery it may be brought to your room.  For patients admitted to the hospital, checkout time is 11:00 AM the day of              discharge.      Please read over the following fact sheets that you were given:     Preparing for Surgery

## 2022-08-06 NOTE — Telephone Encounter (Signed)
Preadmission screen  

## 2022-08-07 ENCOUNTER — Ambulatory Visit (INDEPENDENT_AMBULATORY_CARE_PROVIDER_SITE_OTHER): Payer: Medicaid Other | Admitting: Family Medicine

## 2022-08-07 ENCOUNTER — Encounter (HOSPITAL_COMMUNITY): Payer: Self-pay

## 2022-08-07 ENCOUNTER — Encounter: Payer: Self-pay | Admitting: Family Medicine

## 2022-08-07 ENCOUNTER — Other Ambulatory Visit: Payer: Self-pay

## 2022-08-07 VITALS — BP 129/82 | HR 75 | Wt 227.9 lb

## 2022-08-07 DIAGNOSIS — O26899 Other specified pregnancy related conditions, unspecified trimester: Secondary | ICD-10-CM

## 2022-08-07 DIAGNOSIS — A6009 Herpesviral infection of other urogenital tract: Secondary | ICD-10-CM

## 2022-08-07 DIAGNOSIS — Z3009 Encounter for other general counseling and advice on contraception: Secondary | ICD-10-CM | POA: Diagnosis not present

## 2022-08-07 DIAGNOSIS — O0990 Supervision of high risk pregnancy, unspecified, unspecified trimester: Secondary | ICD-10-CM

## 2022-08-07 DIAGNOSIS — O99323 Drug use complicating pregnancy, third trimester: Secondary | ICD-10-CM

## 2022-08-07 DIAGNOSIS — O98313 Other infections with a predominantly sexual mode of transmission complicating pregnancy, third trimester: Secondary | ICD-10-CM | POA: Diagnosis not present

## 2022-08-07 DIAGNOSIS — O99820 Streptococcus B carrier state complicating pregnancy: Secondary | ICD-10-CM

## 2022-08-07 DIAGNOSIS — F1120 Opioid dependence, uncomplicated: Secondary | ICD-10-CM

## 2022-08-07 DIAGNOSIS — Z6791 Unspecified blood type, Rh negative: Secondary | ICD-10-CM

## 2022-08-07 DIAGNOSIS — Z3A38 38 weeks gestation of pregnancy: Secondary | ICD-10-CM

## 2022-08-07 NOTE — Progress Notes (Signed)
   Subjective:  Tracey Ho is a 26 y.o. [id number] at [redacted]w[redacted]d being seen today for ongoing prenatal care.  She is currently monitored for the following issues for this high-risk pregnancy and has MDD (major depressive disorder), single episode, moderate (HCC); ADHD (attention deficit hyperactivity disorder), combined type; ODD (oppositional defiant disorder); Cannabis abuse; Supervision of high risk pregnancy, antepartum; Opioid use disorder; Unwanted fertility; Rh negative, antepartum; Recurrent genital herpes; History of suicide attempt; GBS (group B Streptococcus carrier), +RV culture, currently pregnant; Genital herpes affecting pregnancy in third trimester; and Suboxone maintenance treatment complicating pregnancy, antepartum, third trimester (HCC) on their problem list.  Patient reports  contractions .  Contractions: Irritability. Vag. Bleeding: None.  Movement: Present. Denies leaking of fluid.   The following portions of the patient's history were reviewed and updated as appropriate: allergies, current medications, past family history, past medical history, past social history, past surgical history and problem list. Problem list updated.  Objective:   Vitals:   08/07/22 1135  BP: 129/82  Pulse: 75  Weight: 227 lb 14.4 oz (103.4 kg)    Fetal Status: Fetal Heart Rate (bpm): 145   Movement: Present  Presentation: Vertex  General:  Alert, oriented and cooperative. Patient is in no acute distress.  Skin: Skin is warm and dry. No rash noted.   Cardiovascular: Normal heart rate noted  Respiratory: Normal respiratory effort, no problems with respiration noted  Abdomen: Soft, gravid, appropriate for gestational age. Pain/Pressure: Present     Pelvic: Vag. Bleeding: None     Cervical exam performed Dilation: 3 Effacement (%): Thick Station: 0  Extremities: Normal range of motion.     Mental Status: Normal mood and affect. Normal behavior. Normal judgment and thought content.   Urinalysis:       Assessment and Plan:  Pregnancy: [id number] at [redacted]w[redacted]d  1. Supervision of high risk pregnancy, antepartum BP and FHR normal Cervix checked per request as she is having lots of contractions, 3/Thick/0, extremely posterior cervix Likely no change from last time when it was also very posterior Does not look laborous, likely just multiparous cervix Discussed labor precautions in detail, to go to MAU immediately if contractions become more persistent/closer together  2. Genital herpes affecting pregnancy in third trimester Outbreak diagnosed last visit Scheduled for primary CS in three days  3. Unwanted fertility Only signed BTL form on 07/31/2022 She is hesitant about birth control because of migraines, heavy periods, weight gain Discussed that hormonal IUD actually sounds perfect as it would address many of her concerns, she will consider  4. Suboxone maintenance treatment complicating pregnancy, antepartum, third trimester (HCC) Had relapse recently but doing well since Stable on 8 mg BID  5. Rh negative, antepartum Rhogam given on 07/31/2022  6. GBS (group B Streptococcus carrier), +RV culture, currently pregnant ppx  Term labor symptoms and general obstetric precautions including but not limited to vaginal bleeding, contractions, leaking of fluid and fetal movement were reviewed in detail with the patient. Please refer to After Visit Summary for other counseling recommendations.  Return in 6 weeks (on 09/18/2022) for PP check.   Venora Maples, MD

## 2022-08-07 NOTE — Patient Instructions (Signed)

## 2022-08-08 ENCOUNTER — Other Ambulatory Visit (HOSPITAL_COMMUNITY)
Admission: RE | Admit: 2022-08-08 | Discharge: 2022-08-08 | Disposition: A | Discharge status: Home / Self Care | Payer: Medicaid Other | Source: Ambulatory Visit | Attending: Obstetrics & Gynecology | Admitting: Obstetrics & Gynecology

## 2022-08-08 ENCOUNTER — Telehealth (HOSPITAL_COMMUNITY): Payer: Self-pay | Admitting: *Deleted

## 2022-08-08 NOTE — Telephone Encounter (Signed)
Pt did not show for 0900 PAT appt.  Called pt and she stated she was in labor at [facility] and would not be coming for the appt.

## 2022-08-10 ENCOUNTER — Inpatient Hospital Stay (HOSPITAL_COMMUNITY)
Admission: RE | Admit: 2022-08-10 | Payer: Medicaid Other | Source: Home / Self Care | Admitting: Obstetrics & Gynecology

## 2022-08-10 ENCOUNTER — Encounter (HOSPITAL_COMMUNITY): Admission: RE | Payer: Self-pay | Source: Home / Self Care

## 2022-08-10 DIAGNOSIS — F1190 Opioid use, unspecified, uncomplicated: Secondary | ICD-10-CM | POA: Diagnosis present

## 2022-08-10 DIAGNOSIS — Z3009 Encounter for other general counseling and advice on contraception: Secondary | ICD-10-CM | POA: Diagnosis present

## 2022-08-10 DIAGNOSIS — O26899 Other specified pregnancy related conditions, unspecified trimester: Secondary | ICD-10-CM

## 2022-08-10 DIAGNOSIS — O98313 Other infections with a predominantly sexual mode of transmission complicating pregnancy, third trimester: Secondary | ICD-10-CM

## 2022-08-10 DIAGNOSIS — O0990 Supervision of high risk pregnancy, unspecified, unspecified trimester: Secondary | ICD-10-CM

## 2022-08-10 DIAGNOSIS — F1120 Opioid dependence, uncomplicated: Secondary | ICD-10-CM

## 2022-08-10 SURGERY — Surgical Case
Anesthesia: Regional

## 2022-08-14 ENCOUNTER — Encounter: Payer: Self-pay | Admitting: Family Medicine

## 2022-08-21 ENCOUNTER — Other Ambulatory Visit: Payer: Self-pay

## 2022-08-21 ENCOUNTER — Encounter: Payer: Self-pay | Admitting: Family Medicine

## 2022-08-27 ENCOUNTER — Other Ambulatory Visit: Payer: Self-pay | Admitting: Family Medicine

## 2022-08-28 ENCOUNTER — Telehealth: Payer: Self-pay

## 2022-08-28 NOTE — Telephone Encounter (Signed)
Received a call from [employer] stating that she was the pt's home and pt scored a 13 on Edinburgh.  Pt reports hx of depression and was prescribed Abilify.  Pt reports that she did not like it because it made her feel funny.  RN stated that the pt delivered at [facility] and that pt's BP is 136/92 with c/o headaches and observed BLE swelling.    Called pt and pt stated that she would be able to come in tomorrow 08/29/22 @ 0930 for a BP check.    Leonette Nutting  08/28/22

## 2022-08-29 ENCOUNTER — Ambulatory Visit: Payer: Medicaid Other

## 2022-09-18 ENCOUNTER — Encounter: Payer: Self-pay | Admitting: Certified Nurse Midwife

## 2022-09-18 ENCOUNTER — Ambulatory Visit: Payer: Medicaid Other | Admitting: Certified Nurse Midwife

## 2022-09-18 DIAGNOSIS — Z3009 Encounter for other general counseling and advice on contraception: Secondary | ICD-10-CM

## 2022-09-18 DIAGNOSIS — F1190 Opioid use, unspecified, uncomplicated: Secondary | ICD-10-CM

## 2022-09-18 DIAGNOSIS — F321 Major depressive disorder, single episode, moderate: Secondary | ICD-10-CM

## 2022-09-18 NOTE — Progress Notes (Signed)
Pt did not come to PP appt

## 2022-09-24 ENCOUNTER — Other Ambulatory Visit: Payer: Self-pay | Admitting: Family Medicine

## 2022-10-17 ENCOUNTER — Encounter: Payer: Self-pay | Admitting: Family Medicine

## 2022-10-17 ENCOUNTER — Other Ambulatory Visit: Payer: Self-pay

## 2022-10-17 ENCOUNTER — Ambulatory Visit (INDEPENDENT_AMBULATORY_CARE_PROVIDER_SITE_OTHER): Payer: Medicaid Other | Admitting: Family Medicine

## 2022-10-17 DIAGNOSIS — F321 Major depressive disorder, single episode, moderate: Secondary | ICD-10-CM | POA: Diagnosis not present

## 2022-10-17 DIAGNOSIS — F1190 Opioid use, unspecified, uncomplicated: Secondary | ICD-10-CM

## 2022-10-17 DIAGNOSIS — Z8759 Personal history of other complications of pregnancy, childbirth and the puerperium: Secondary | ICD-10-CM | POA: Insufficient documentation

## 2022-10-17 MED ORDER — BUPRENORPHINE HCL-NALOXONE HCL 8-2 MG SL FILM: 1.0000 | ORAL_FILM | Freq: Three times a day (TID) | SUBLINGUAL | 0 refills | Status: DC

## 2022-10-17 NOTE — Progress Notes (Signed)
[place] Partum Visit Note  Tracey Ho is a 27 y.o. [phone] female who presents for a postpartum visit. She is several weeks postpartum following a normal spontaneous vaginal delivery.  I have fully reviewed the prenatal and intrapartum course. The delivery was at 38w gestational weeks.  Anesthesia: epidural. Postpartum course has been uneventful. Baby is doing well. Baby is feeding by bottle - Similac Advance. Bleeding no bleeding. Bowel function is normal. Bladder function is normal. Patient is sexually active. Contraception method is none. Postpartum depression screening: negative.   The pregnancy intention screening data noted above was reviewed. Potential methods of contraception were discussed. The patient elected to proceed with No data recorded.   Edinburgh Postnatal Depression Scale - 10/17/22 0833       Edinburgh Postnatal Depression Scale:  In the Past 7 Days   I have been able to laugh and see the funny side of things. 0    I have looked forward with enjoyment to things. 0    I have blamed myself unnecessarily when things went wrong. 2    I have been anxious or worried for no good reason. 0    I have felt scared or panicky for no good reason. 0    Things have been getting on top of me. 0    I have been so unhappy that I have had difficulty sleeping. 2    I have felt sad or miserable. 2    I have been so unhappy that I have been crying. 2    The thought of harming myself has occurred to me. 0    Edinburgh Postnatal Depression Scale Total 8             There are no preventive care reminders to display for this patient.  The following portions of the patient's history were reviewed and updated as appropriate: allergies, current medications, past family history, past medical history, past social history, past surgical history, and problem list.  Review of Systems Pertinent items noted in HPI and remainder of comprehensive ROS otherwise negative.  Objective:  BP 135/89    Pulse 83    General:  alert, cooperative, and appears stated age   Breasts:  not indicated  Lungs: Comfortalbe on room air  Wound N.a  GU exam:  not indicated        Assessment:    There are no diagnoses linked to this encounter.  Normal postpartum exam.   Plan:   Essential components of care per ACOG recommendations:  1.  Mood and well being: Patient with negative depression screening today. Reviewed local resources for support.  - Patient tobacco use? Yes. Patient desires to quit? No.   - hx of drug use? Yes. See below.    2. Infant care and feeding:  -Patient currently breastmilk feeding? No.  -Social determinants of health (SDOH) reviewed in EPIC. The following needs were identified: following w [place] case worker  3. Sexuality, contraception and birth spacing - Patient does not want a pregnancy in the next year.  Desired family size is unsure # children.  - Reviewed reproductive life planning. Reviewed contraceptive methods based on pt preferences and effectiveness.  Patient declined contraception today. Has tried hormonal birth control and had numerous issues with her cycle and mood. Counseled at length about IUD's which have no hormones or minimal to no systemic hormonal absorption. She will consider.    - Discussed birth spacing of 18 months  4. Sleep and fatigue -  Encouraged family/partner/community support of 4 hrs of uninterrupted sleep to help with mood and fatigue  5. Physical Recovery  - Discussed patients delivery and complications. She describes her labor as good. - Patient had a Vaginal, no problems at delivery. Patient had no laceration. Perineal healing reviewed. Patient expressed understanding - Patient has urinary incontinence? No. - Patient is safe to resume physical and sexual activity  6.  Health Maintenance - HM due items addressed Yes - Last pap smear  Diagnosis  Date Value Ref Range Status  03/27/2022   Final   - Negative for  intraepithelial lesion or malignancy (NILM)   Pap smear not done at today's visit.  -Breast Cancer screening indicated? No.   7. Chronic Disease/Pregnancy Condition follow up: Hypertension and OUD HTN: elevated during hospital stay, normotensive after recheck here. Advised of lifetime risk of HTN and need for regular surveillance.  OUD: patient reports she has resumed taking suboxone. On review of her PDMP, last Rx was from me on 07/31/2022 but this was not filled until 09/24/2022. In addition her discharge summary from her hospitalization reports that she self weaned off of suboxone and declined resuming it.   Patient reports this is incorrect, she was trying to wean but never wanted to stop. Has been using pills to control withdrawal, last use a few days ago. Was previously using 8 mg BID but felt her symptoms weren't well controlled, would like to trial higher dose.  Had frank conversation with patient that visits have been spotty and we've had trouble building trust in our relationship. She reports significant difficulties with transportation. We discussed medicaid will provide this and it is important to try as hard as possible to come to appts or we will need to find her another doctor.   Will trial 8 mg TID, though encouraged her to do only 4 mg in the evening and see how it goes. Since its been a few days she should be OK to start today, has only ever had oxycodone in her samples.   UDS collected, rx sent.   - follow up in 4 weeks for OUD check in  Clarnce Flock, [place] for [facility], [place]

## 2022-10-22 LAB — TOXASSURE SELECT 13 (MW), URINE

## 2022-11-12 ENCOUNTER — Encounter: Payer: Self-pay | Admitting: Family Medicine

## 2022-11-12 ENCOUNTER — Other Ambulatory Visit (HOSPITAL_COMMUNITY): Payer: Self-pay

## 2022-11-12 ENCOUNTER — Other Ambulatory Visit (HOSPITAL_COMMUNITY)
Admission: RE | Admit: 2022-11-12 | Discharge: 2022-11-12 | Disposition: A | Discharge status: Home / Self Care | Payer: Medicaid Other | Source: Ambulatory Visit | Attending: Family Medicine | Admitting: Family Medicine

## 2022-11-12 ENCOUNTER — Ambulatory Visit (INDEPENDENT_AMBULATORY_CARE_PROVIDER_SITE_OTHER): Payer: Medicaid Other | Admitting: Family Medicine

## 2022-11-12 VITALS — BP 134/89 | HR 92 | Ht 66.0 in | Wt 212.2 lb

## 2022-11-12 DIAGNOSIS — Z9189 Other specified personal risk factors, not elsewhere classified: Secondary | ICD-10-CM | POA: Diagnosis not present

## 2022-11-12 DIAGNOSIS — N898 Other specified noninflammatory disorders of vagina: Secondary | ICD-10-CM | POA: Diagnosis not present

## 2022-11-12 DIAGNOSIS — F1190 Opioid use, unspecified, uncomplicated: Secondary | ICD-10-CM | POA: Diagnosis not present

## 2022-11-12 MED ORDER — NALOXONE HCL 4 MG/0.1ML NA LIQD
NASAL | 11 refills | Status: DC
  Filled 2022-11-12: qty 2, 1d supply, fill #0

## 2022-11-12 MED ORDER — BUPRENORPHINE HCL-NALOXONE HCL 8-2 MG SL FILM
ORAL_FILM | SUBLINGUAL | 0 refills | Status: DC
  Filled 2022-11-12: qty 105, 30d supply, fill #0
  Filled 2022-11-12: qty 105, fill #0
  Filled 2022-11-12: qty 105, 30d supply, fill #0
  Filled 2022-11-14: qty 90, 30d supply, fill #0
  Filled 2022-12-12: qty 15, 5d supply, fill #1

## 2022-11-12 NOTE — Progress Notes (Signed)
GYNECOLOGY OFFICE VISIT NOTE  History:   Tracey Ho is a 27 y.o. [phone] here today for OUD follow up.  Reports she has felt intense cravings since last visit Not feeling any other withdrawal symptoms but did relapse for a day on pain pills but went back to taking her suboxone 8 mg TID Taking one film around 1100, another around 1700, and another around 2300 Denies any excess stress or other issues  Also had unprotected sex and would like STI testing, both vaginal swabs and serologies  There are no preventive care reminders to display for this patient.  Past Medical History:  Diagnosis Date   ADHD (attention deficit hyperactivity disorder)    Anemia    Migraine headache    PID (acute pelvic inflammatory disease) 10/22/2020    Past Surgical History:  Procedure Laterality Date   NERVE SURGERY     NO PAST SURGERIES      The following portions of the patient's history were reviewed and updated as appropriate: allergies, current medications, past family history, past medical history, past social history, past surgical history and problem list.   Health Maintenance:   Last pap: Lab Results  Component Value Date   DIAGPAP  03/27/2022    - Negative for intraepithelial lesion or malignancy (NILM)     Last mammogram:  N/a    Review of Systems:  Pertinent items noted in HPI and remainder of comprehensive ROS otherwise negative.  Physical Exam:  BP 134/89   Pulse 92   Ht 5' 6"$  (1.676 m)   Wt 212 lb 3.2 oz (96.3 kg)   LMP 11/11/2022 (Exact Date)   Breastfeeding No   BMI 34.25 kg/m  CONSTITUTIONAL: Well-developed, well-nourished female in no acute distress.  HEENT:  Normocephalic, atraumatic. External right and left ear normal. No scleral icterus.  NECK: Normal range of motion, supple, no masses noted on observation SKIN: No rash noted. Not diaphoretic. No erythema. No pallor. MUSCULOSKELETAL: Normal range of motion. No edema noted. NEUROLOGIC: Alert and oriented  to person, place, and time. Normal muscle tone coordination.  PSYCHIATRIC: Normal mood and affect. Normal behavior. Normal judgment and thought content. RESPIRATORY: Effort normal, no problems with respiration noted   Labs and Imaging No results found for this or any previous visit (from the past 168 hour(s)). No results found.    Assessment and Plan:   Problem List Items Addressed This Visit       Other   Opioid use disorder - Primary    Discussed that at current dose and based on description of symptoms I am not sure she is having withdrawal and we may need to look at other psychological/social factors. Strongly recommended reconnecting with Roselyn Reef to check in and she was amenable.   That being said I am not opposed to a dose increase, will increase to 1.5 films in the evenings, might help to smooth out the longer period between then and her morning dose. UDS collected, Narcan rx also sent.   STI screening per patient request.       Relevant Medications   Buprenorphine HCl-Naloxone HCl (SUBOXONE) 8-2 MG FILM   naloxone (NARCAN) nasal spray 4 mg/0.1 mL   Other Relevant Orders   ToxASSURE Select 13 (MW), Urine   Ambulatory referral to [facility]   Other Visit Diagnoses     Vaginal discharge       Relevant Orders   Cervicovaginal ancillary only( [place])   At risk for sexually transmitted disease  due to unprotected sex       Relevant Orders   Cervicovaginal ancillary only( [place])   RPR+HBsAg+HCVAb+...       Routine preventative health maintenance measures emphasized. Please refer to After Visit Summary for other counseling recommendations.   Return in about 4 weeks (around 12/10/2022) for OUD f/u.    Total face-to-face time with patient: 20 minutes.  Over 50% of encounter was spent on counseling and coordination of care.   Clarnce Flock, MD/MPH Attending Family Medicine Physician, [facility] for [facility], [place]

## 2022-11-12 NOTE — Assessment & Plan Note (Addendum)
Discussed that at current dose and based on description of symptoms I am not sure she is having withdrawal and we may need to look at other psychological/social factors. Strongly recommended reconnecting with Roselyn Reef to check in and she was amenable.   That being said I am not opposed to a dose increase, will increase to 1.5 films in the evenings, might help to smooth out the longer period between then and her morning dose. UDS collected, Narcan rx also sent.   STI screening per patient request.

## 2022-11-13 ENCOUNTER — Other Ambulatory Visit (HOSPITAL_COMMUNITY): Payer: Self-pay

## 2022-11-13 LAB — CERVICOVAGINAL ANCILLARY ONLY
Bacterial Vaginitis (gardnerella): POSITIVE — AB
Candida Glabrata: NEGATIVE
Candida Vaginitis: NEGATIVE
Chlamydia: NEGATIVE
Comment: NEGATIVE
Comment: NEGATIVE
Comment: NEGATIVE
Comment: NEGATIVE
Comment: NEGATIVE
Comment: NORMAL
Neisseria Gonorrhea: NEGATIVE
Trichomonas: NEGATIVE

## 2022-11-13 LAB — RPR+HBSAG+HCVAB+...
HIV Screen 4th Generation wRfx: NONREACTIVE
Hep C Virus Ab: NONREACTIVE
Hepatitis B Surface Ag: NEGATIVE
RPR Ser Ql: NONREACTIVE

## 2022-11-13 MED ORDER — METRONIDAZOLE 0.75 % VA GEL
1.0000 | Freq: Every day | VAGINAL | 0 refills | Status: DC
  Filled 2022-11-13: qty 70, 5d supply, fill #0

## 2022-11-13 NOTE — Addendum Note (Signed)
Addended by: Clayton Lefort on: 11/13/2022 02:55 PM   Modules accepted: Orders

## 2022-11-14 ENCOUNTER — Other Ambulatory Visit (HOSPITAL_COMMUNITY): Payer: Self-pay

## 2022-11-15 ENCOUNTER — Other Ambulatory Visit (HOSPITAL_COMMUNITY): Payer: Self-pay

## 2022-11-15 LAB — TOXASSURE SELECT 13 (MW), URINE

## 2022-11-18 NOTE — BH Specialist Note (Signed)
Pt did not arrive to video visit and did not answer the phone; I have been unable to reach this patient by phone.  A letter is being sent. To leave voice message as voicemail not set up; left MyChart message for patient.

## 2022-12-02 ENCOUNTER — Ambulatory Visit: Payer: Self-pay | Admitting: Clinical

## 2022-12-02 DIAGNOSIS — Z91199 Patient's noncompliance with other medical treatment and regimen due to unspecified reason: Secondary | ICD-10-CM

## 2022-12-10 ENCOUNTER — Ambulatory Visit: Payer: Medicaid Other | Admitting: Family Medicine

## 2022-12-12 ENCOUNTER — Encounter: Payer: Self-pay | Admitting: Family Medicine

## 2022-12-12 DIAGNOSIS — F1190 Opioid use, unspecified, uncomplicated: Secondary | ICD-10-CM

## 2022-12-13 ENCOUNTER — Other Ambulatory Visit: Payer: Self-pay

## 2022-12-13 ENCOUNTER — Other Ambulatory Visit: Payer: Medicaid Other

## 2022-12-13 ENCOUNTER — Other Ambulatory Visit (HOSPITAL_COMMUNITY): Payer: Self-pay

## 2022-12-13 DIAGNOSIS — F1190 Opioid use, unspecified, uncomplicated: Secondary | ICD-10-CM

## 2022-12-13 MED ORDER — BUPRENORPHINE HCL-NALOXONE HCL 8-2 MG SL FILM: ORAL_FILM | SUBLINGUAL | 0 refills | Status: DC

## 2022-12-16 ENCOUNTER — Telehealth: Payer: Self-pay | Admitting: Family Medicine

## 2022-12-16 DIAGNOSIS — F1190 Opioid use, unspecified, uncomplicated: Secondary | ICD-10-CM

## 2022-12-16 NOTE — Telephone Encounter (Signed)
Patient request her RX be sent to [facility] outpatient pharmacy.Dyke Maes)

## 2022-12-23 ENCOUNTER — Ambulatory Visit: Payer: Medicaid Other | Admitting: *Deleted

## 2022-12-23 DIAGNOSIS — F1190 Opioid use, unspecified, uncomplicated: Secondary | ICD-10-CM

## 2022-12-23 NOTE — Progress Notes (Unsigned)
Patient dropped off a urine for drug testing. Dr. Ernestina Patches notified and order placed.  Staci Acosta

## 2022-12-25 ENCOUNTER — Other Ambulatory Visit (HOSPITAL_COMMUNITY): Payer: Self-pay

## 2022-12-25 MED ORDER — BUPRENORPHINE HCL-NALOXONE HCL 8-2 MG SL FILM
ORAL_FILM | SUBLINGUAL | 0 refills | Status: DC
  Filled 2022-12-25: qty 49, 17d supply, fill #0

## 2022-12-25 NOTE — Addendum Note (Signed)
Addended by: Clayton Lefort on: 12/25/2022 02:12 PM   Modules accepted: Orders

## 2022-12-26 LAB — TOXASSURE SELECT 13 (MW), URINE

## 2022-12-27 ENCOUNTER — Other Ambulatory Visit (HOSPITAL_COMMUNITY): Payer: Self-pay

## 2023-01-03 ENCOUNTER — Other Ambulatory Visit (HOSPITAL_COMMUNITY): Payer: Self-pay

## 2023-01-07 ENCOUNTER — Other Ambulatory Visit (HOSPITAL_COMMUNITY): Payer: Self-pay

## 2023-01-29 ENCOUNTER — Encounter: Payer: Self-pay | Admitting: Family Medicine

## 2023-01-29 DIAGNOSIS — F1190 Opioid use, unspecified, uncomplicated: Secondary | ICD-10-CM

## 2023-01-29 MED ORDER — BUPRENORPHINE HCL-NALOXONE HCL 8-2 MG SL FILM: ORAL_FILM | SUBLINGUAL | 0 refills | Status: DC

## 2023-01-30 ENCOUNTER — Other Ambulatory Visit: Payer: Self-pay | Admitting: Family Medicine

## 2023-01-30 DIAGNOSIS — F1190 Opioid use, unspecified, uncomplicated: Secondary | ICD-10-CM

## 2023-02-05 ENCOUNTER — Other Ambulatory Visit (HOSPITAL_COMMUNITY): Payer: Self-pay

## 2023-02-05 MED ORDER — BUPRENORPHINE HCL-NALOXONE HCL 8-2 MG SL FILM
ORAL_FILM | SUBLINGUAL | 0 refills | Status: DC
  Filled 2023-02-05: qty 90, 30d supply, fill #0

## 2023-02-05 NOTE — Addendum Note (Signed)
Addended by: Merian Capron on: 02/05/2023 03:58 PM   Modules accepted: Orders

## 2023-02-18 ENCOUNTER — Other Ambulatory Visit (HOSPITAL_COMMUNITY): Payer: Self-pay

## 2023-02-18 ENCOUNTER — Other Ambulatory Visit (HOSPITAL_COMMUNITY)
Admission: RE | Admit: 2023-02-18 | Discharge: 2023-02-18 | Disposition: A | Discharge status: Home / Self Care | Payer: Medicaid Other | Source: Ambulatory Visit | Attending: Family Medicine | Admitting: Family Medicine

## 2023-02-18 ENCOUNTER — Ambulatory Visit (INDEPENDENT_AMBULATORY_CARE_PROVIDER_SITE_OTHER): Payer: Medicaid Other | Admitting: Family Medicine

## 2023-02-18 ENCOUNTER — Other Ambulatory Visit: Payer: Self-pay

## 2023-02-18 ENCOUNTER — Encounter: Payer: Self-pay | Admitting: Family Medicine

## 2023-02-18 VITALS — BP 132/87 | HR 98 | Ht 66.0 in | Wt 201.0 lb

## 2023-02-18 DIAGNOSIS — Z113 Encounter for screening for infections with a predominantly sexual mode of transmission: Secondary | ICD-10-CM

## 2023-02-18 DIAGNOSIS — F419 Anxiety disorder, unspecified: Secondary | ICD-10-CM | POA: Insufficient documentation

## 2023-02-18 DIAGNOSIS — R300 Dysuria: Secondary | ICD-10-CM

## 2023-02-18 DIAGNOSIS — L731 Pseudofolliculitis barbae: Secondary | ICD-10-CM | POA: Insufficient documentation

## 2023-02-18 DIAGNOSIS — F1190 Opioid use, unspecified, uncomplicated: Secondary | ICD-10-CM | POA: Diagnosis not present

## 2023-02-18 LAB — POCT URINALYSIS DIP (DEVICE)
Bilirubin Urine: NEGATIVE
Glucose, UA: NEGATIVE mg/dL
Hgb urine dipstick: NEGATIVE
Ketones, ur: NEGATIVE mg/dL
Nitrite: NEGATIVE
Protein, ur: NEGATIVE mg/dL
Specific Gravity, Urine: 1.02 (ref 1.005–1.030)
Urobilinogen, UA: 0.2 mg/dL (ref 0.0–1.0)
pH: 6 (ref 5.0–8.0)

## 2023-02-18 MED ORDER — METRONIDAZOLE 500 MG PO TABS
500.0000 mg | ORAL_TABLET | Freq: Two times a day (BID) | ORAL | 0 refills | Status: AC
  Filled 2023-02-18: qty 14, 7d supply, fill #0

## 2023-02-18 MED ORDER — HYDROXYZINE HCL 10 MG PO TABS
10.0000 mg | ORAL_TABLET | Freq: Three times a day (TID) | ORAL | 0 refills | Status: DC | PRN
  Filled 2023-02-18: qty 30, 10d supply, fill #0
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
## 2023-02-18 NOTE — Assessment & Plan Note (Signed)
Significant life stressors. She declines BH referral or SSRI. Accepts trial of vistaril.

## 2023-02-18 NOTE — Assessment & Plan Note (Addendum)
Reports she is stable. Having some issues with getting full rx filled at pharmacy, hopefully will resolve with switching to [facility] pharmacy but will also message pharmacy. Message set to send refill next week. UDS today.

## 2023-02-18 NOTE — Assessment & Plan Note (Signed)
Discussed etiology, recommended to avoid using tweezers, shaving, etc. Discussed that surgical removal of inclusion cyst is possible but seems like overkill at present. Follow up PRN.

## 2023-02-19 ENCOUNTER — Other Ambulatory Visit (HOSPITAL_COMMUNITY): Payer: Self-pay

## 2023-02-19 LAB — CERVICOVAGINAL ANCILLARY ONLY
Bacterial Vaginitis (gardnerella): NEGATIVE
Candida Glabrata: POSITIVE — AB
Candida Vaginitis: POSITIVE — AB
Chlamydia: NEGATIVE
Comment: NEGATIVE
Comment: NEGATIVE
Comment: NEGATIVE
Comment: NEGATIVE
Comment: NEGATIVE
Comment: NORMAL
Neisseria Gonorrhea: NEGATIVE
Trichomonas: NEGATIVE

## 2023-02-19 MED ORDER — FLUCONAZOLE 150 MG PO TABS
150.0000 mg | ORAL_TABLET | Freq: Once | ORAL | 0 refills | Status: AC
  Filled 2023-02-19: qty 1, 1d supply, fill #0

## 2023-02-19 NOTE — Addendum Note (Signed)
Addended by: Merian Capron on: 02/19/2023 02:10 PM   Modules accepted: Orders

## 2023-02-20 LAB — URINE CULTURE

## 2023-02-23 LAB — OPIATE CLASS, MS, UR RFX
Codeine: NOT DETECTED ng/mg creat
Dihydrocodeine: NOT DETECTED ng/mg creat
Hydrocodone: NOT DETECTED ng/mg creat
Hydromorphone: NOT DETECTED ng/mg creat
Morphine: NOT DETECTED ng/mg creat
Norcodeine: NOT DETECTED ng/mg creat
Norhydrocodone: NOT DETECTED ng/mg creat
Normorphine: NOT DETECTED ng/mg creat
Opiate Class Confirmation: NEGATIVE

## 2023-02-23 LAB — OXYCODONE CLASS, MS, UR RFX
Noroxycodone: 1313 ng/mg creat
Noroxymorphone: 272 ng/mg creat
Oxycodone Class Confirmation: POSITIVE
Oxycodone: 380 ng/mg creat
Oxymorphone: 309 ng/mg creat

## 2023-02-23 LAB — TOXASSURE FLEX 15, UR
6-ACETYLMORPHINE IA: NEGATIVE ng/mL
7-aminoclonazepam: NOT DETECTED ng/mg creat
AMPHETAMINES IA: NEGATIVE ng/mL
Alpha-hydroxyalprazolam: NOT DETECTED ng/mg creat
Alpha-hydroxymidazolam: NOT DETECTED ng/mg creat
Alpha-hydroxytriazolam: NOT DETECTED ng/mg creat
Alprazolam: NOT DETECTED ng/mg creat
BARBITURATES IA: NEGATIVE ng/mL
BUPRENORPHINE: POSITIVE
Benzodiazepines: NEGATIVE
Buprenorphine: 45 ng/mg creat
CANNABINOIDS IA: NEGATIVE ng/mL
COCAINE METABOLITE IA: NEGATIVE ng/mL
Clonazepam: NOT DETECTED ng/mg creat
Creatinine: 138 mg/dL
Desalkylflurazepam: NOT DETECTED ng/mg creat
Desmethyldiazepam: NOT DETECTED ng/mg creat
Desmethylflunitrazepam: NOT DETECTED ng/mg creat
Diazepam: NOT DETECTED ng/mg creat
ETHYL ALCOHOL Enzymatic: NEGATIVE g/dL
FENTANYL: NEGATIVE
Fentanyl: NOT DETECTED ng/mg creat
Flunitrazepam: NOT DETECTED ng/mg creat
Lorazepam: NOT DETECTED ng/mg creat
METHADONE IA: NEGATIVE ng/mL
METHADONE MTB IA: NEGATIVE ng/mL
Midazolam: NOT DETECTED ng/mg creat
Norbuprenorphine: 178 ng/mg creat
Norfentanyl: NOT DETECTED ng/mg creat
Oxazepam: NOT DETECTED ng/mg creat
PHENCYCLIDINE IA: NEGATIVE ng/mL
TAPENTADOL, IA: NEGATIVE ng/mL
TRAMADOL IA: NEGATIVE ng/mL
Temazepam: NOT DETECTED ng/mg creat

## 2023-02-28 ENCOUNTER — Other Ambulatory Visit: Payer: Self-pay | Admitting: Family Medicine

## 2023-02-28 ENCOUNTER — Other Ambulatory Visit (HOSPITAL_COMMUNITY): Payer: Self-pay

## 2023-02-28 ENCOUNTER — Encounter: Payer: Self-pay | Admitting: Family Medicine

## 2023-02-28 DIAGNOSIS — F1190 Opioid use, unspecified, uncomplicated: Secondary | ICD-10-CM

## 2023-02-28 MED ORDER — BUPRENORPHINE HCL-NALOXONE HCL 8-2 MG SL FILM
ORAL_FILM | SUBLINGUAL | 0 refills | Status: DC
Start: 2023-02-28 — End: 2023-03-20
  Filled 2023-02-28: qty 90, 30d supply, fill #0

## 2023-03-03 ENCOUNTER — Other Ambulatory Visit (HOSPITAL_COMMUNITY): Payer: Self-pay

## 2023-03-04 ENCOUNTER — Encounter: Payer: Self-pay | Admitting: Family Medicine

## 2023-03-04 ENCOUNTER — Other Ambulatory Visit (HOSPITAL_COMMUNITY): Payer: Self-pay

## 2023-03-06 ENCOUNTER — Other Ambulatory Visit (HOSPITAL_COMMUNITY): Payer: Self-pay

## 2023-03-20 ENCOUNTER — Other Ambulatory Visit: Payer: Self-pay

## 2023-03-20 ENCOUNTER — Other Ambulatory Visit (HOSPITAL_COMMUNITY)
Admission: RE | Admit: 2023-03-20 | Discharge: 2023-03-20 | Disposition: A | Discharge status: Home / Self Care | Payer: Medicaid Other | Source: Ambulatory Visit | Attending: Family Medicine | Admitting: Family Medicine

## 2023-03-20 ENCOUNTER — Ambulatory Visit (INDEPENDENT_AMBULATORY_CARE_PROVIDER_SITE_OTHER): Payer: Medicaid Other | Admitting: Family Medicine

## 2023-03-20 ENCOUNTER — Encounter: Payer: Self-pay | Admitting: Family Medicine

## 2023-03-20 ENCOUNTER — Other Ambulatory Visit (HOSPITAL_COMMUNITY): Payer: Self-pay

## 2023-03-20 VITALS — BP 131/85 | HR 91 | Wt 206.4 lb

## 2023-03-20 DIAGNOSIS — F1190 Opioid use, unspecified, uncomplicated: Secondary | ICD-10-CM

## 2023-03-20 DIAGNOSIS — B3731 Acute candidiasis of vulva and vagina: Secondary | ICD-10-CM

## 2023-03-20 DIAGNOSIS — A5901 Trichomonal vulvovaginitis: Secondary | ICD-10-CM

## 2023-03-20 DIAGNOSIS — N898 Other specified noninflammatory disorders of vagina: Secondary | ICD-10-CM | POA: Diagnosis present

## 2023-03-20 DIAGNOSIS — L732 Hidradenitis suppurativa: Secondary | ICD-10-CM | POA: Diagnosis not present

## 2023-03-20 MED ORDER — BUPRENORPHINE HCL-NALOXONE HCL 8-2 MG SL FILM
ORAL_FILM | SUBLINGUAL | 0 refills | Status: DC
Start: 2023-03-20 — End: 2023-04-17
  Filled 2023-03-20 – 2023-03-24 (×2): qty 105, 30d supply, fill #0

## 2023-03-20 MED ORDER — DOXYCYCLINE HYCLATE 100 MG PO CAPS
100.0000 mg | ORAL_CAPSULE | Freq: Every day | ORAL | 2 refills | Status: DC
Start: 2023-03-20 — End: 2023-04-17
  Filled 2023-03-20: qty 30, 30d supply, fill #0

## 2023-03-20 NOTE — Progress Notes (Signed)
GYNECOLOGY OFFICE VISIT NOTE  History:   Tracey Ho is a 27 y.o. [phone] here today for multiple issues.  Having some discharge and would like to get swab  Doing well with suboxone overall but feels like she needs extra film in the evening, which she takes intermittently but this contributes to her running out sooner This has not been filled at the pharmacy, has been told medicaid will not cover additional past 24 mg without PA  Has boil developing in L axilla Has had them before intermittently and needed to have them drained  There are no preventive care reminders to display for this patient.  Past Medical History:  Diagnosis Date   ADHD (attention deficit hyperactivity disorder)    Anemia    Migraine headache    PID (acute pelvic inflammatory disease) 10/22/2020    Past Surgical History:  Procedure Laterality Date   NERVE SURGERY     NO PAST SURGERIES      The following portions of the patient's history were reviewed and updated as appropriate: allergies, current medications, past family history, past medical history, past social history, past surgical history and problem list.   Health Maintenance:   Last pap: Lab Results  Component Value Date   DIAGPAP  03/27/2022    - Negative for intraepithelial lesion or malignancy (NILM)    Last mammogram:  N/a    Review of Systems:  Pertinent items noted in HPI and remainder of comprehensive ROS otherwise negative.  Physical Exam:  BP 131/85   Pulse 91   Wt 206 lb 6.4 oz (93.6 kg)   LMP 02/05/2023 (Exact Date)   BMI 33.31 kg/m  CONSTITUTIONAL: Well-developed, well-nourished female in no acute distress.  HEENT:  Normocephalic, atraumatic. External right and left ear normal. No scleral icterus.  NECK: Normal range of motion, supple, no masses noted on observation SKIN: No rash noted. Not diaphoretic. No erythema. No pallor. MUSCULOSKELETAL: Normal range of motion. No edema noted. NEUROLOGIC: Alert and oriented to  person, place, and time. Normal muscle tone coordination.  PSYCHIATRIC: Normal mood and affect. Normal behavior. Normal judgment and thought content. RESPIRATORY: Effort normal, no problems with respiration noted ABDOMEN: No masses noted. No other overt distention noted.   PELVIC:  external genitalia normal  Labs and Imaging Results for orders placed or performed in visit on 03/20/23 (from the past 168 hour(s))  Cervicovaginal ancillary only   Collection Time: 03/20/23  2:32 PM  Result Value Ref Range   Neisseria Gonorrhea Negative    Chlamydia Negative    Trichomonas Positive (A)    Bacterial Vaginitis (gardnerella) Positive (A)    Candida Vaginitis Positive (A)    Candida Glabrata Positive (A)    Comment      Normal Reference Range Bacterial Vaginosis - Negative   Comment Normal Reference Range Candida Species - Negative    Comment Normal Reference Range Candida Galbrata - Negative    Comment Normal Reference Range Trichomonas - Negative    Comment Normal Reference Ranger Chlamydia - Negative    Comment      Normal Reference Range Neisseria Gonorrhea - Negative   No results found.    Assessment and Plan:   Problem List Items Addressed This Visit       Musculoskeletal and Integument   Hidradenitis suppurativa    Hx consistent w HS, discussed options for treatment, will trial doxycycline suppression for a few months.       Relevant Medications   doxycycline (VIBRAMYCIN) 100  MG capsule   metroNIDAZOLE (FLAGYL) 500 MG tablet   fluconazole (DIFLUCAN) 150 MG tablet     Genitourinary   Trichomonal vaginitis    After visit swabs returned positive for Trich, yeast, and BV. Rx sent for all of the above.       Relevant Medications   metroNIDAZOLE (FLAGYL) 500 MG tablet   fluconazole (DIFLUCAN) 150 MG tablet     Other   Opioid use disorder - Primary    UDS today Contacted pharmacy, will try to get PA completed to get her higher dose prescription, refill sent in the  meantime      Relevant Medications   Buprenorphine HCl-Naloxone HCl (SUBOXONE) 8-2 MG FILM   Other Relevant Orders   ToxAssure Flex 15, Ur   Other Visit Diagnoses     Vaginal discharge       Relevant Orders   Cervicovaginal ancillary only (Completed)   Yeast vaginitis       Relevant Medications   metroNIDAZOLE (FLAGYL) 500 MG tablet   fluconazole (DIFLUCAN) 150 MG tablet      Return in about 4 weeks (around 04/17/2023) for OUD f/u.    Total face-to-face time with patient: 25 minutes.  Over 50% of encounter was spent on counseling and coordination of care.   Venora Maples, MD/MPH Attending Family Medicine Physician, [facility] for [facility], [facility]

## 2023-03-24 ENCOUNTER — Other Ambulatory Visit (HOSPITAL_COMMUNITY): Payer: Self-pay

## 2023-03-24 ENCOUNTER — Encounter (HOSPITAL_COMMUNITY): Payer: Self-pay

## 2023-03-24 ENCOUNTER — Encounter: Payer: Self-pay | Admitting: Pharmacist

## 2023-03-24 LAB — CERVICOVAGINAL ANCILLARY ONLY
Bacterial Vaginitis (gardnerella): POSITIVE — AB
Candida Glabrata: POSITIVE — AB
Candida Vaginitis: POSITIVE — AB
Chlamydia: NEGATIVE
Comment: NEGATIVE
Comment: NEGATIVE
Comment: NEGATIVE
Comment: NEGATIVE
Comment: NEGATIVE
Comment: NORMAL
Neisseria Gonorrhea: NEGATIVE
Trichomonas: POSITIVE — AB

## 2023-03-24 MED ORDER — METRONIDAZOLE 500 MG PO TABS
500.0000 mg | ORAL_TABLET | Freq: Two times a day (BID) | ORAL | 0 refills | Status: AC
Start: 2023-03-24 — End: 2023-03-31
  Filled 2023-03-24: qty 14, 7d supply, fill #0

## 2023-03-24 MED ORDER — FLUCONAZOLE 150 MG PO TABS
150.0000 mg | ORAL_TABLET | Freq: Once | ORAL | 0 refills | Status: AC
Start: 2023-03-24 — End: 2023-03-25
  Filled 2023-03-24: qty 1, 1d supply, fill #0

## 2023-03-25 DIAGNOSIS — A5901 Trichomonal vulvovaginitis: Secondary | ICD-10-CM | POA: Insufficient documentation

## 2023-03-25 DIAGNOSIS — L732 Hidradenitis suppurativa: Secondary | ICD-10-CM | POA: Insufficient documentation

## 2023-03-25 LAB — TOXASSURE FLEX 15, UR
6-ACETYLMORPHINE IA: NEGATIVE ng/mL
7-aminoclonazepam: NOT DETECTED ng/mg creat
AMPHETAMINES IA: NEGATIVE ng/mL
Alpha-hydroxyalprazolam: NOT DETECTED ng/mg creat
Alpha-hydroxymidazolam: NOT DETECTED ng/mg creat
Alpha-hydroxytriazolam: NOT DETECTED ng/mg creat
Alprazolam: NOT DETECTED ng/mg creat
BARBITURATES IA: NEGATIVE ng/mL
BUPRENORPHINE: NEGATIVE
Benzodiazepines: NEGATIVE
Buprenorphine: NOT DETECTED ng/mg creat
CANNABINOIDS IA: NEGATIVE ng/mL
COCAINE METABOLITE IA: NEGATIVE ng/mL
Clonazepam: NOT DETECTED ng/mg creat
Creatinine: 188 mg/dL
Desalkylflurazepam: NOT DETECTED ng/mg creat
Desmethyldiazepam: NOT DETECTED ng/mg creat
Desmethylflunitrazepam: NOT DETECTED ng/mg creat
Diazepam: NOT DETECTED ng/mg creat
ETHYL ALCOHOL Enzymatic: NEGATIVE g/dL
FENTANYL: NEGATIVE
Fentanyl: NOT DETECTED ng/mg creat
Flunitrazepam: NOT DETECTED ng/mg creat
Lorazepam: NOT DETECTED ng/mg creat
METHADONE IA: NEGATIVE ng/mL
METHADONE MTB IA: NEGATIVE ng/mL
Midazolam: NOT DETECTED ng/mg creat
Norbuprenorphine: NOT DETECTED ng/mg creat
Norfentanyl: NOT DETECTED ng/mg creat
OPIATE CLASS IA: NEGATIVE ng/mL
OXYCODONE CLASS IA: NEGATIVE ng/mL
Oxazepam: NOT DETECTED ng/mg creat
PHENCYCLIDINE IA: NEGATIVE ng/mL
TAPENTADOL, IA: NEGATIVE ng/mL
TRAMADOL IA: NEGATIVE ng/mL
Temazepam: NOT DETECTED ng/mg creat

## 2023-03-25 NOTE — Assessment & Plan Note (Signed)
After visit swabs returned positive for Trich, yeast, and BV. Rx sent for all of the above.

## 2023-03-25 NOTE — Assessment & Plan Note (Signed)
UDS today Contacted pharmacy, will try to get PA completed to get her higher dose prescription, refill sent in the meantime

## 2023-03-25 NOTE — Assessment & Plan Note (Signed)
Hx consistent w HS, discussed options for treatment, will trial doxycycline suppression for a few months.

## 2023-03-28 ENCOUNTER — Encounter: Payer: Self-pay | Admitting: Family Medicine

## 2023-03-31 ENCOUNTER — Other Ambulatory Visit (HOSPITAL_COMMUNITY): Payer: Self-pay

## 2023-04-03 ENCOUNTER — Other Ambulatory Visit (HOSPITAL_COMMUNITY): Payer: Self-pay

## 2023-04-17 ENCOUNTER — Ambulatory Visit (INDEPENDENT_AMBULATORY_CARE_PROVIDER_SITE_OTHER): Payer: MEDICAID | Admitting: Family Medicine

## 2023-04-17 ENCOUNTER — Encounter: Payer: Self-pay | Admitting: Family Medicine

## 2023-04-17 ENCOUNTER — Other Ambulatory Visit: Payer: Self-pay

## 2023-04-17 ENCOUNTER — Other Ambulatory Visit (HOSPITAL_COMMUNITY): Payer: Self-pay

## 2023-04-17 VITALS — BP 139/88 | HR 92 | Ht 66.0 in | Wt 196.0 lb

## 2023-04-17 DIAGNOSIS — A5901 Trichomonal vulvovaginitis: Secondary | ICD-10-CM

## 2023-04-17 DIAGNOSIS — F1190 Opioid use, unspecified, uncomplicated: Secondary | ICD-10-CM

## 2023-04-17 MED ORDER — METRONIDAZOLE 500 MG PO TABS
500.0000 mg | ORAL_TABLET | Freq: Two times a day (BID) | ORAL | 0 refills | Status: AC
  Filled 2023-04-17: qty 14, 7d supply, fill #0

## 2023-04-17 MED ORDER — FLUCONAZOLE 150 MG PO TABS
150.0000 mg | ORAL_TABLET | Freq: Once | ORAL | 0 refills | Status: AC
  Filled 2023-04-17: qty 1, 1d supply, fill #0

## 2023-04-17 MED ORDER — BUPRENORPHINE HCL-NALOXONE HCL 8-2 MG SL FILM
1.0000 | ORAL_FILM | Freq: Two times a day (BID) | SUBLINGUAL | 0 refills | Status: DC
Start: 2023-04-17 — End: 2023-04-28
  Filled 2023-04-17: qty 28, 14d supply, fill #0

## 2023-04-17 NOTE — Patient Instructions (Signed)
Primary Visit Coverage  Payer Plan Sponsor Code Group Number Group Name  [employer] PLAN [facility] PLAN      Primary Visit Coverage Subscriber  ID Name SSN Address  [id number] [name],Tracey [id number]     [place], [place] [address]

## 2023-04-17 NOTE — Assessment & Plan Note (Signed)
Discussed with Tracey Ho that regardless of why UDS was how it was, that is in the past. Going forward, I can not ethically continue to prescribe suboxone without objective evidence that the patient is taking it, and if this occurs again I will not be able to continue prescribing for her. Given she has been out for some time will resume at 8 mg BID, written for only a two week supply. Collect UDS today. Patient will return in one week for repeat UDS, if appropriate with suboxone present then another rx can be sent to get her to her next appointment with me in 4 weeks.

## 2023-04-17 NOTE — Assessment & Plan Note (Signed)
Unable to pick up medicine, rx resent.

## 2023-04-17 NOTE — Progress Notes (Signed)
GYNECOLOGY OFFICE VISIT NOTE  History:   Tracey Ho is a 27 y.o. [phone] here today for OUD follow up.  Last fill of suboxone per PDMP was on 02/28/2023 for quantity 90 I saw patient on 03/20/23, she reported taking TID strips and sometimes more and asked for increase in rx UDS from that visit showed no substances what so ever  Discussed with patient, she has no explanation and does not know why nothing was in the UDS Denies any use of illicits Frustrated she has not been contacted about this  There are no preventive care reminders to display for this patient.  Past Medical History:  Diagnosis Date   ADHD (attention deficit hyperactivity disorder)    Anemia    Migraine headache    PID (acute pelvic inflammatory disease) 10/22/2020    Past Surgical History:  Procedure Laterality Date   NERVE SURGERY     NO PAST SURGERIES      The following portions of the patient's history were reviewed and updated as appropriate: allergies, current medications, past family history, past medical history, past social history, past surgical history and problem list.   Health Maintenance:   Last pap: Lab Results  Component Value Date   DIAGPAP  03/27/2022    - Negative for intraepithelial lesion or malignancy (NILM)    Last mammogram:  N/a    Review of Systems:  Pertinent items noted in HPI and remainder of comprehensive ROS otherwise negative.  Physical Exam:  BP 139/88   Pulse 92   Ht 5\' 6"  (1.676 m)   Wt 196 lb (88.9 kg)   LMP 03/27/2023 (Exact Date)   BMI 31.64 kg/m  CONSTITUTIONAL: Well-developed, well-nourished female in no acute distress.  HEENT:  Normocephalic, atraumatic. External right and left ear normal. No scleral icterus.  NECK: Normal range of motion, supple, no masses noted on observation SKIN: No rash noted. Not diaphoretic. No erythema. No pallor. MUSCULOSKELETAL: Normal range of motion. No edema noted. NEUROLOGIC: Alert and oriented to person, place, and  time. Normal muscle tone coordination.  PSYCHIATRIC: Normal mood and affect. Normal behavior. Normal judgment and thought content. RESPIRATORY: Effort normal, no problems with respiration noted   Labs and Imaging No results found for this or any previous visit (from the past 168 hour(s)). No results found.    Assessment and Plan:   Problem List Items Addressed This Visit       Other   Opioid use disorder - Primary    Discussed with Sanam that regardless of why UDS was how it was, that is in the past. Going forward, I can not ethically continue to prescribe suboxone without objective evidence that the patient is taking it, and if this occurs again I will not be able to continue prescribing for her. Given she has been out for some time will resume at 8 mg BID, written for only a two week supply. Collect UDS today. Patient will return in one week for repeat UDS, if appropriate with suboxone present then another rx can be sent to get her to her next appointment with me in 4 weeks.       Relevant Medications   Buprenorphine HCl-Naloxone HCl (SUBOXONE) 8-2 MG FILM   Other Relevant Orders   ToxAssure Flex 15, Ur   ToxASSURE Select 13 (MW), Urine    Routine preventative health maintenance measures emphasized. Please refer to After Visit Summary for other counseling recommendations.   Return in about 4 weeks (around 05/15/2023) for OUD  f/u.    Total face-to-face time with patient: 20 minutes.  Over 50% of encounter was spent on counseling and coordination of care.   Venora Maples, MD/MPH Attending Family Medicine Physician, [facility] for [facility], [facility], [facility]

## 2023-04-21 IMAGING — US US OB < 14 WEEKS - US OB TV
1 series · 15 of 28 positions shown · non-contrast
Comparison: None.

CLINICAL DATA: Abdominal pain. Estimated gestational age by last
menstrual period equals 9 weeks 4 days

EXAM:
OBSTETRIC <14 WK US AND TRANSVAGINAL OB US
TECHNIQUE: Both transabdominal and transvaginal ultrasound examinations were
performed for complete evaluation of the gestation as well as the
maternal uterus, adnexal regions, and pelvic cul-de-sac.
Transvaginal technique was performed to assess early pregnancy.

[Series 1: us ob < 14 weeks - us ob tv · 76 acquisitions, 15 frames shown]
[im 1/76]
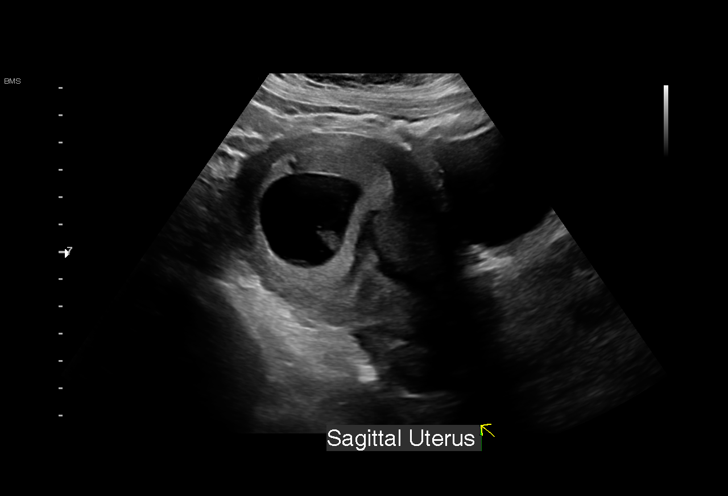
[im 6/76]
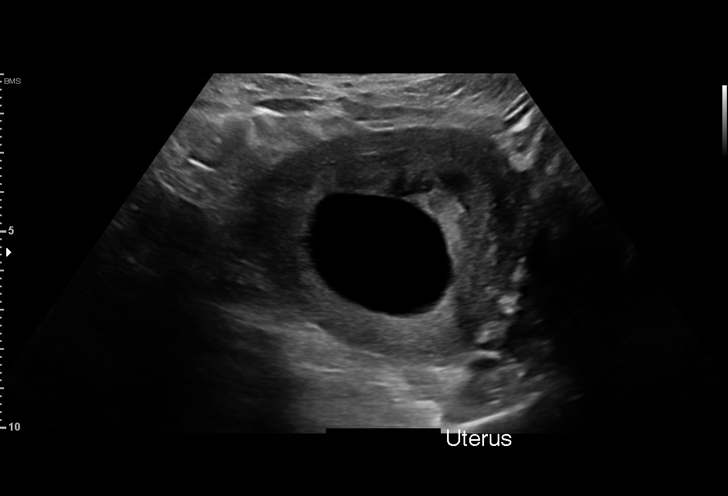
[im 12/76]
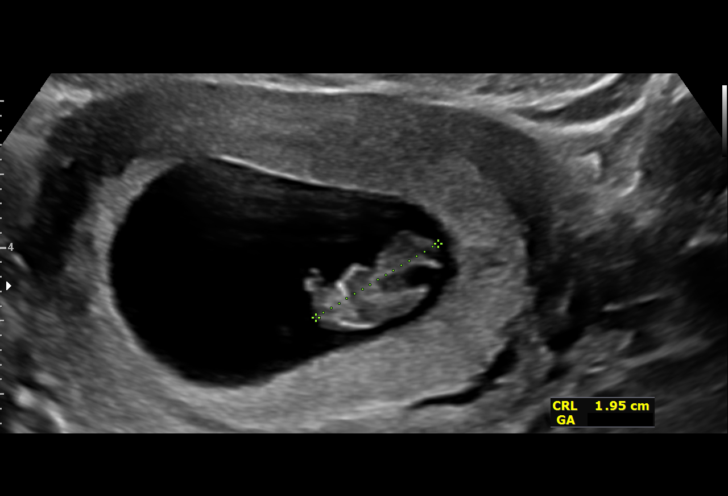
[im 17/76]
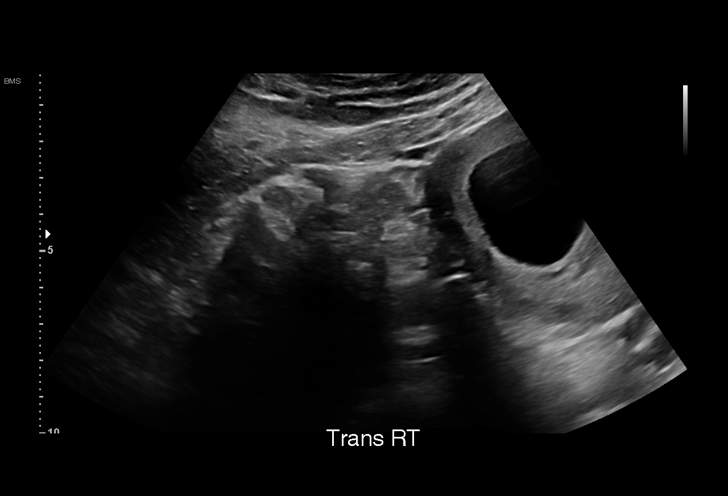
[im 23/76]
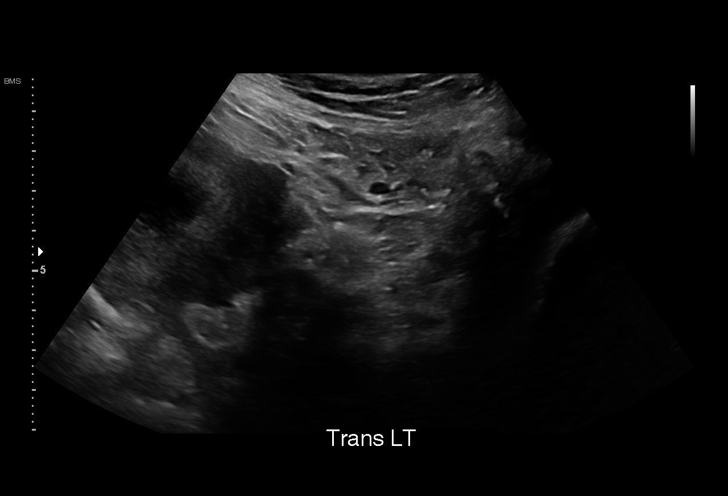
[im 28/76]
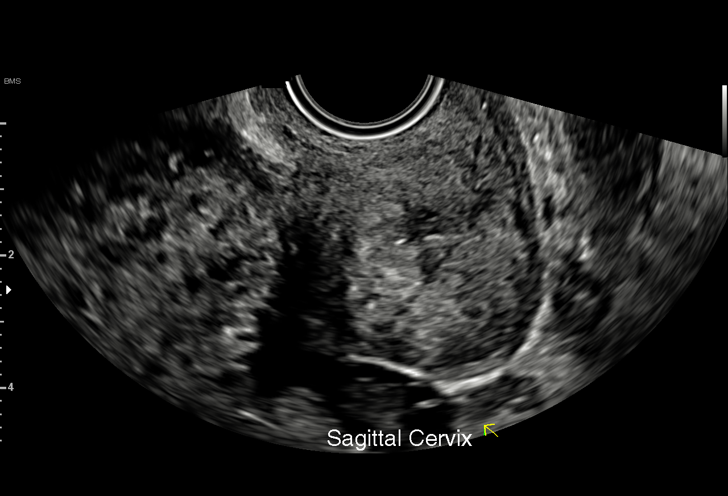
[im 34/76]
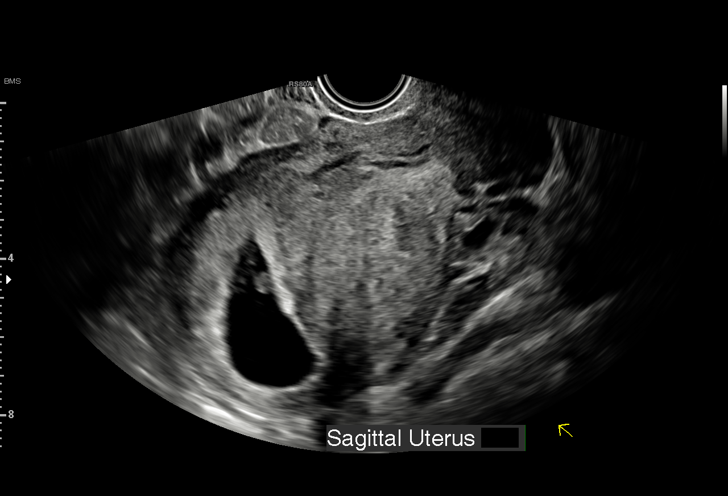
[im 39/76]
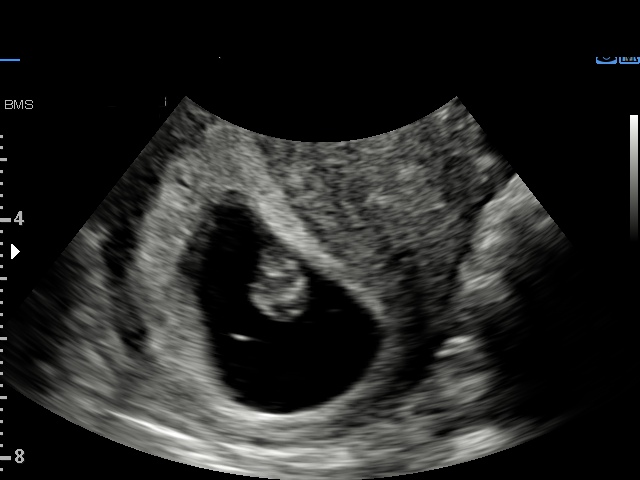
[im 42/76]
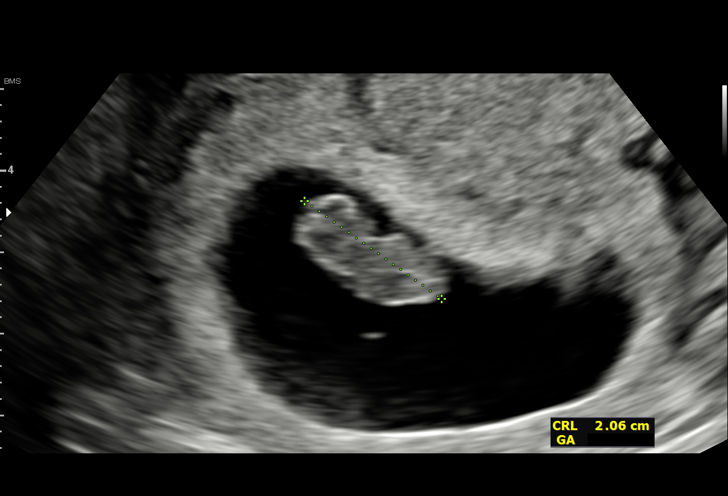
[im 48/76]
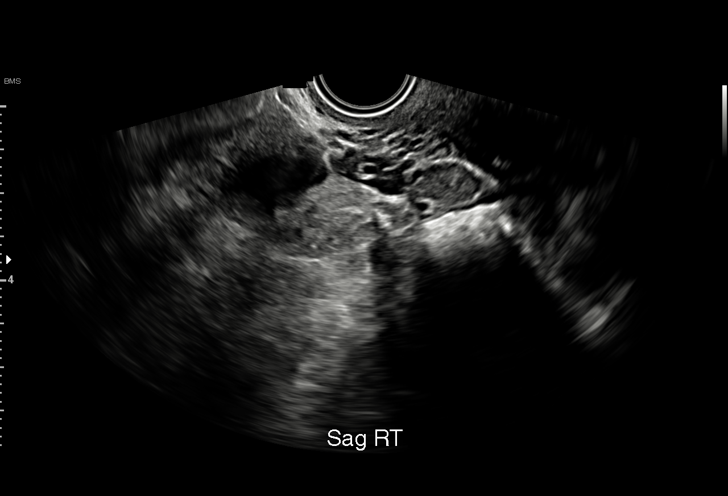
[im 53/76]
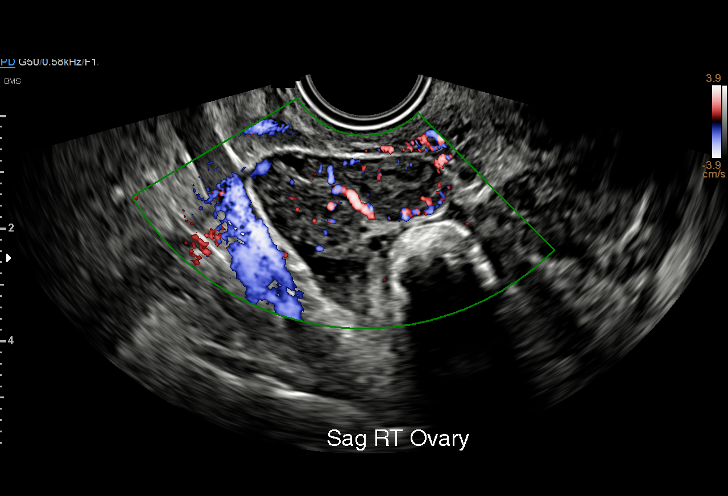
[im 59/76]
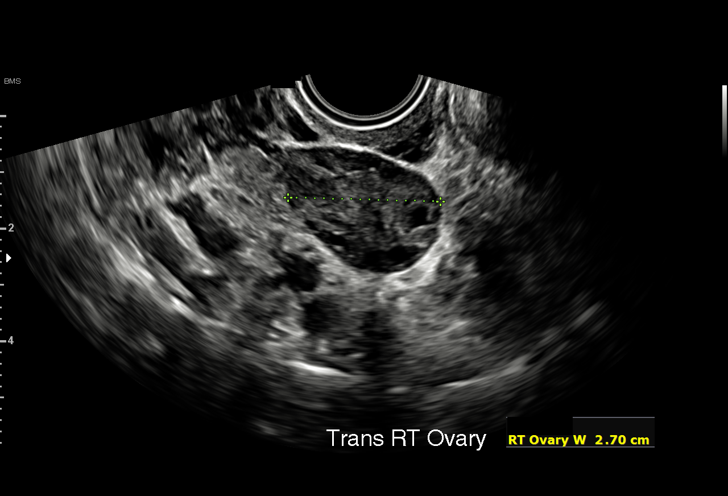
[im 64/76]
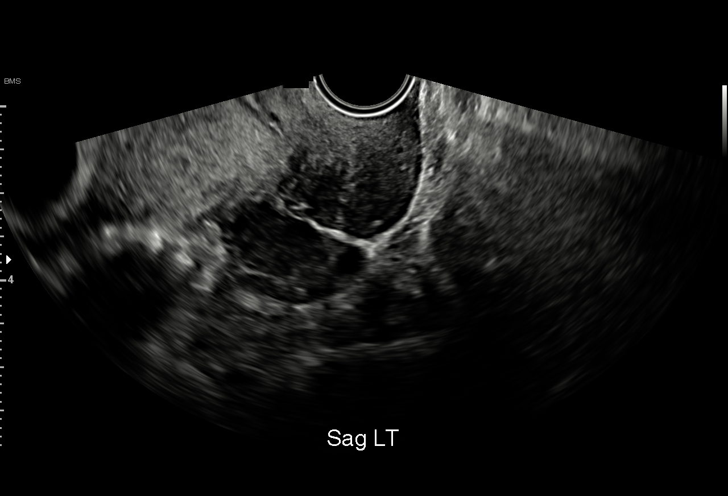
[im 70/76]
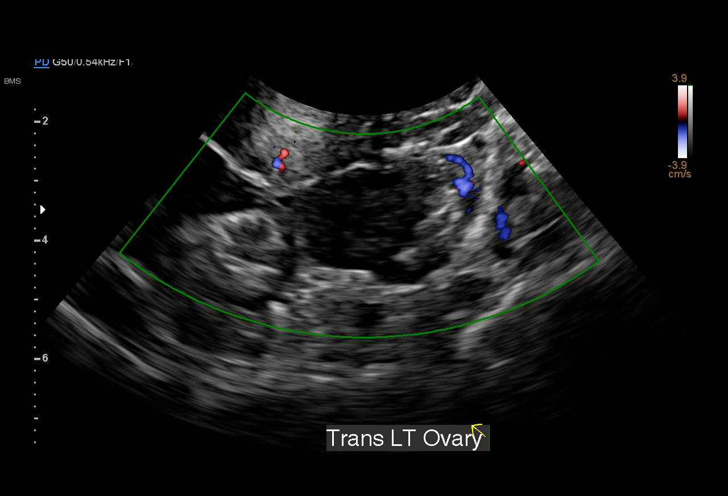
[im 76/76]
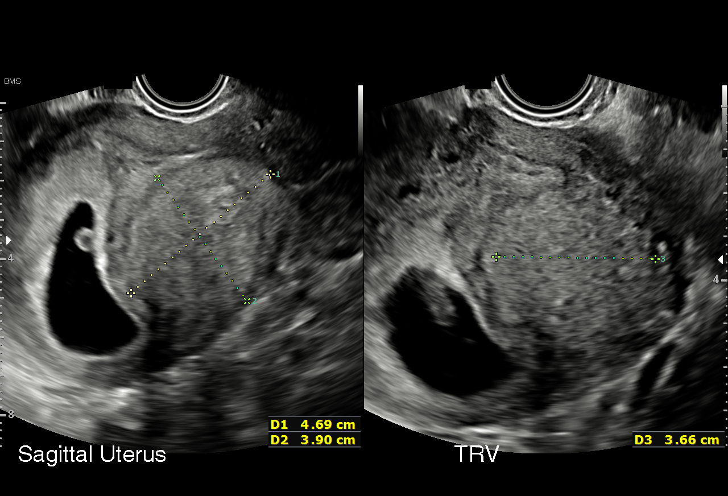

[15 of 28 positions shown; findings below may reference images not displayed]

FINDINGS: Intrauterine gestational sac: Single

Yolk sac:  Present

Embryo:  Present

Cardiac Activity: Present

Heart Rate: 161 bpm

MSD:   mm    w     d

CRL:  20 mm   8 w   4 d                  US EDC: 08/16/2022

Subchorionic hemorrhage: Rounded mass in the myometrium measuring
4.6 x 3.9 cm. Masses adjacent to the endometrium and gestational
sac. Favor a uterine leiomyoma rather than the subchorionic
hemorrhage.

Maternal uterus/adnexae: Normal ovaries. Corpus luteal cyst on the
RIGHT
IMPRESSION: 1. Single intrauterine gestation with embryo and normal cardiac
activity.

2. Estimated gestational age by crown rump length equals 8 weeks 4
days.

3.  Probable intramural leiomyoma as above

## 2023-04-24 ENCOUNTER — Encounter: Payer: Self-pay | Admitting: Family Medicine

## 2023-04-24 ENCOUNTER — Telehealth: Payer: Self-pay | Admitting: Family Medicine

## 2023-04-24 ENCOUNTER — Other Ambulatory Visit: Payer: MEDICAID

## 2023-04-24 DIAGNOSIS — F1190 Opioid use, unspecified, uncomplicated: Secondary | ICD-10-CM

## 2023-04-24 NOTE — Telephone Encounter (Signed)
Patient called in regarding her appointment for 1:30 pm. As per patient she only needs to leave urine sample. She was unable to make that 1:30 pm appointment but she wanted to know if she can come in before we close to leave her urine sample. Spoke with a nurse and was advise she can some in before 430 to leave her urine. Called patient multiple times will get an error message after a few rings. My chart message sent as well

## 2023-04-28 MED ORDER — BUPRENORPHINE HCL-NALOXONE HCL 8-2 MG SL FILM: 1.0000 | ORAL_FILM | Freq: Two times a day (BID) | SUBLINGUAL | 0 refills | Status: DC

## 2023-04-29 ENCOUNTER — Other Ambulatory Visit (HOSPITAL_COMMUNITY): Payer: Self-pay

## 2023-04-30 ENCOUNTER — Other Ambulatory Visit: Payer: Self-pay | Admitting: Family Medicine

## 2023-04-30 ENCOUNTER — Other Ambulatory Visit: Payer: Self-pay

## 2023-04-30 ENCOUNTER — Other Ambulatory Visit (HOSPITAL_COMMUNITY): Payer: Self-pay

## 2023-04-30 ENCOUNTER — Encounter: Payer: Self-pay | Admitting: Family Medicine

## 2023-04-30 DIAGNOSIS — F1190 Opioid use, unspecified, uncomplicated: Secondary | ICD-10-CM

## 2023-04-30 MED ORDER — BUPRENORPHINE HCL-NALOXONE HCL 8-2 MG SL FILM
1.0000 | ORAL_FILM | Freq: Two times a day (BID) | SUBLINGUAL | 0 refills | Status: DC
Start: 2023-05-05 — End: 2023-04-30
  Filled 2023-04-30: qty 60, 30d supply, fill #0

## 2023-04-30 MED ORDER — BUPRENORPHINE HCL-NALOXONE HCL 8-2 MG SL FILM
1.0000 | ORAL_FILM | Freq: Two times a day (BID) | SUBLINGUAL | 0 refills | Status: DC
Start: 2023-05-05 — End: 2023-04-30

## 2023-04-30 MED ORDER — BUPRENORPHINE HCL-NALOXONE HCL 8-2 MG SL FILM
8.0000 mg | ORAL_FILM | Freq: Two times a day (BID) | SUBLINGUAL | 0 refills | Status: AC
Start: 2023-04-30 — End: 2023-05-31
  Filled 2023-04-30: qty 60, 30d supply, fill #0

## 2023-04-30 NOTE — Progress Notes (Signed)
Sent in refill for 8/12 through 8/29 (#36 films)  She was previously sent in 1 week of suboxone on Monday 04/27/22 (#14 films).   Future Appointments  Date Time Provider Department Center  05/22/2023 11:15 AM Venora Maples, MD [facility]

## 2023-05-01 ENCOUNTER — Other Ambulatory Visit (HOSPITAL_COMMUNITY): Payer: Self-pay

## 2023-05-21 ENCOUNTER — Other Ambulatory Visit: Payer: Self-pay

## 2023-05-21 ENCOUNTER — Encounter: Payer: Self-pay | Admitting: Family Medicine

## 2023-05-21 DIAGNOSIS — F1190 Opioid use, unspecified, uncomplicated: Secondary | ICD-10-CM

## 2023-05-21 NOTE — Progress Notes (Signed)
Pt here in office to provide urine for Tox screen. Tox screen ordered.  Judeth Cornfield, RNC

## 2023-05-22 ENCOUNTER — Other Ambulatory Visit: Payer: Self-pay

## 2023-05-22 ENCOUNTER — Encounter: Payer: Self-pay | Admitting: Family Medicine

## 2023-05-22 ENCOUNTER — Other Ambulatory Visit (HOSPITAL_COMMUNITY)
Admission: RE | Admit: 2023-05-22 | Discharge: 2023-05-22 | Disposition: A | Discharge status: Home / Self Care | Payer: MEDICAID | Source: Ambulatory Visit | Attending: Family Medicine | Admitting: Family Medicine

## 2023-05-22 ENCOUNTER — Ambulatory Visit: Payer: MEDICAID | Admitting: Family Medicine

## 2023-05-22 ENCOUNTER — Other Ambulatory Visit (HOSPITAL_COMMUNITY): Payer: Self-pay

## 2023-05-22 VITALS — BP 128/87 | HR 81 | Ht 66.0 in | Wt 204.4 lb

## 2023-05-22 DIAGNOSIS — A6000 Herpesviral infection of urogenital system, unspecified: Secondary | ICD-10-CM | POA: Diagnosis not present

## 2023-05-22 DIAGNOSIS — F1190 Opioid use, unspecified, uncomplicated: Secondary | ICD-10-CM

## 2023-05-22 DIAGNOSIS — B9689 Other specified bacterial agents as the cause of diseases classified elsewhere: Secondary | ICD-10-CM | POA: Insufficient documentation

## 2023-05-22 DIAGNOSIS — A6004 Herpesviral vulvovaginitis: Secondary | ICD-10-CM | POA: Diagnosis not present

## 2023-05-22 DIAGNOSIS — N898 Other specified noninflammatory disorders of vagina: Secondary | ICD-10-CM

## 2023-05-22 DIAGNOSIS — N760 Acute vaginitis: Secondary | ICD-10-CM | POA: Diagnosis not present

## 2023-05-22 MED ORDER — METRONIDAZOLE 500 MG PO TABS
500.0000 mg | ORAL_TABLET | Freq: Two times a day (BID) | ORAL | 1 refills | Status: AC
Start: 2023-05-22 — End: 2024-02-13
  Filled 2023-05-22 – 2024-02-04 (×2): qty 14, 7d supply, fill #0

## 2023-05-22 MED ORDER — VALACYCLOVIR HCL 1 G PO TABS
1000.0000 mg | ORAL_TABLET | Freq: Every day | ORAL | 3 refills | Status: DC
Start: 2023-05-22 — End: 2023-12-30
  Filled 2023-05-22: qty 30, 30d supply, fill #0

## 2023-05-22 MED ORDER — METRONIDAZOLE 500 MG PO TABS
500.0000 mg | ORAL_TABLET | Freq: Two times a day (BID) | ORAL | 0 refills | Status: DC
Start: 2023-05-22 — End: 2023-05-22
  Filled 2023-05-22: qty 14, 7d supply, fill #0

## 2023-05-22 NOTE — Assessment & Plan Note (Signed)
Reports recurrent infections, check self swab, rx sent for flagyl, may need to consider suppression therapy.

## 2023-05-22 NOTE — Progress Notes (Signed)
GYNECOLOGY OFFICE VISIT NOTE  History:   Tracey Ho is a 27 y.o. [phone] here today for OUD follow up.  I saw patient on 03/20/23, she reported taking TID strips and sometimes more and asked for increase in rx UDS from that visit showed no substances what so ever Seen again on 04/17/23 and had frank discussion that if she not taking the suboxone then I can not prescribe it. At that time reported being out so resumed on 8 mg BID UDS from that visit showed oxycodone and metabolites along with very small amount of buprenorphine in appropriate concetrations She again left a urine on 04/24/23 that had buprenorphine only but in abnormal ratio concentrations Sent refill for 8 BID by Dr. Alvester Morin for 30 days  States she has been taking suboxone 8 mg anywhere from BID to TID Reports lapse and took two pills of oxycodone recently  Also reports outbreak of HSV, would like valtrex refill  Also thinks she has BV, has noticed odor, has recurrent infections  There are no preventive care reminders to display for this patient.  Past Medical History:  Diagnosis Date   ADHD (attention deficit hyperactivity disorder)    Anemia    Migraine headache    PID (acute pelvic inflammatory disease) 10/22/2020    Past Surgical History:  Procedure Laterality Date   NERVE SURGERY     NO PAST SURGERIES      The following portions of the patient's history were reviewed and updated as appropriate: allergies, current medications, past family history, past medical history, past social history, past surgical history and problem list.   Health Maintenance:   Last pap: Lab Results  Component Value Date   DIAGPAP  03/27/2022    - Negative for intraepithelial lesion or malignancy (NILM)    Last mammogram:  N/a    Review of Systems:  Pertinent items noted in HPI and remainder of comprehensive ROS otherwise negative.  Physical Exam:  BP 128/87   Pulse 81   Ht 5\' 6"  (1.676 m)   Wt 204 lb 6.4 oz (92.7 kg)    LMP 05/19/2023 (Exact Date)   Breastfeeding No   BMI 32.99 kg/m  CONSTITUTIONAL: Well-developed, well-nourished female in no acute distress.  HEENT:  Normocephalic, atraumatic. External right and left ear normal. No scleral icterus.  NECK: Normal range of motion, supple, no masses noted on observation SKIN: No rash noted. Not diaphoretic. No erythema. No pallor. MUSCULOSKELETAL: Normal range of motion. No edema noted. NEUROLOGIC: Alert and oriented to person, place, and time. Normal muscle tone coordination.  PSYCHIATRIC: Normal mood and affect. Normal behavior. Normal judgment and thought content. RESPIRATORY: Effort normal, no problems with respiration noted   Labs and Imaging No results found for this or any previous visit (from the past 168 hour(s)). No results found.    Assessment and Plan:   Problem List Items Addressed This Visit       Genitourinary   BV (bacterial vaginosis)    Reports recurrent infections, check self swab, rx sent for flagyl, may need to consider suppression therapy.       Relevant Medications   valACYclovir (VALTREX) 1000 MG tablet   metroNIDAZOLE (FLAGYL) 500 MG tablet   Recurrent genital herpes    Refill sent      Relevant Medications   valACYclovir (VALTREX) 1000 MG tablet   metroNIDAZOLE (FLAGYL) 500 MG tablet     Other   Opioid use disorder - Primary    Reviewed abnormal metabolite ratios  indicative of having only taken a dose just before visit. Discussed that if this continues I can not continue to prescribe. Follow up UDS. Next due for refill in one week, UDS will result prior to then.       Relevant Orders   ToxAssure Flex 15, Ur   Other Visit Diagnoses     Herpes simplex vulvovaginitis       Relevant Medications   valACYclovir (VALTREX) 1000 MG tablet   metroNIDAZOLE (FLAGYL) 500 MG tablet   Vaginal odor       Relevant Orders   Cervicovaginal ancillary only( [place])       Routine preventative health maintenance  measures emphasized. Please refer to After Visit Summary for other counseling recommendations.   Return in about 4 weeks (around 06/19/2023) for OUD f/u.    Total face-to-face time with patient: 20 minutes.  Over 50% of encounter was spent on counseling and coordination of care.   Venora Maples, MD/MPH Attending Family Medicine Physician, [facility] for [facility], [facility]

## 2023-05-22 NOTE — Assessment & Plan Note (Signed)
Refill sent.

## 2023-05-22 NOTE — Assessment & Plan Note (Signed)
Reviewed abnormal metabolite ratios indicative of having only taken a dose just before visit. Discussed that if this continues I can not continue to prescribe. Follow up UDS. Next due for refill in one week, UDS will result prior to then.

## 2023-05-25 ENCOUNTER — Encounter: Payer: Self-pay | Admitting: Family Medicine

## 2023-05-25 LAB — TOXASSURE FLEX 15, UR
6-ACETYLMORPHINE IA: NEGATIVE ng/mL
7-aminoclonazepam: NOT DETECTED ng/mg{creat}
AMPHETAMINES IA: NEGATIVE ng/mL
Alpha-hydroxyalprazolam: NOT DETECTED ng/mg{creat}
Alpha-hydroxymidazolam: NOT DETECTED ng/mg{creat}
Alpha-hydroxytriazolam: NOT DETECTED ng/mg{creat}
Alprazolam: NOT DETECTED ng/mg{creat}
BARBITURATES IA: NEGATIVE ng/mL
BUPRENORPHINE: POSITIVE
Benzodiazepines: NEGATIVE
Buprenorphine: >1064 ng/mg{creat}
CANNABINOIDS IA: NEGATIVE ng/mL
COCAINE METABOLITE IA: NEGATIVE ng/mL
Clonazepam: NOT DETECTED ng/mg{creat}
Creatinine: 94 mg/dL
Desalkylflurazepam: NOT DETECTED ng/mg{creat}
Desmethyldiazepam: NOT DETECTED ng/mg{creat}
Desmethylflunitrazepam: NOT DETECTED ng/mg{creat}
Diazepam: NOT DETECTED ng/mg creat
ETHYL ALCOHOL Enzymatic: NEGATIVE g/dL
FENTANYL: NEGATIVE
Fentanyl: NOT DETECTED ng/mg creat
Flunitrazepam: NOT DETECTED ng/mg{creat}
Lorazepam: NOT DETECTED ng/mg{creat}
METHADONE IA: NEGATIVE ng/mL
METHADONE MTB IA: NEGATIVE ng/mL
Midazolam: NOT DETECTED ng/mg{creat}
Norbuprenorphine: 15 ng/mg{creat}
Norfentanyl: NOT DETECTED ng/mg{creat}
OPIATE CLASS IA: NEGATIVE ng/mL
OXYCODONE CLASS IA: NEGATIVE ng/mL
Oxazepam: NOT DETECTED ng/mg{creat}
PHENCYCLIDINE IA: NEGATIVE ng/mL
TAPENTADOL, IA: NEGATIVE ng/mL
TRAMADOL IA: NEGATIVE ng/mL
Temazepam: NOT DETECTED ng/mg{creat}

## 2023-05-26 LAB — OPIATE CLASS, MS, UR RFX
Codeine: NOT DETECTED ng/mg{creat}
Dihydrocodeine: NOT DETECTED ng/mg{creat}
Hydrocodone: NOT DETECTED ng/mg{creat}
Hydromorphone: NOT DETECTED ng/mg creat
Morphine: NOT DETECTED ng/mg{creat}
Norcodeine: NOT DETECTED ng/mg{creat}
Norhydrocodone: NOT DETECTED ng/mg{creat}
Normorphine: NOT DETECTED ng/mg{creat}
Opiate Class Confirmation: NEGATIVE

## 2023-05-26 LAB — TOXASSURE FLEX 15, UR
6-ACETYLMORPHINE IA: NEGATIVE ng/mL
7-aminoclonazepam: NOT DETECTED ng/mg{creat}
AMPHETAMINES IA: NEGATIVE ng/mL
Alpha-hydroxyalprazolam: NOT DETECTED ng/mg{creat}
Alpha-hydroxymidazolam: NOT DETECTED ng/mg{creat}
Alpha-hydroxytriazolam: NOT DETECTED ng/mg{creat}
Alprazolam: NOT DETECTED ng/mg{creat}
BARBITURATES IA: NEGATIVE ng/mL
BUPRENORPHINE: NEGATIVE
Benzodiazepines: NEGATIVE
Buprenorphine: NOT DETECTED ng/mg{creat}
CANNABINOIDS IA: NEGATIVE ng/mL
COCAINE METABOLITE IA: NEGATIVE ng/mL
Clonazepam: NOT DETECTED ng/mg{creat}
Creatinine: 29 mg/dL
Desalkylflurazepam: NOT DETECTED ng/mg{creat}
Desmethyldiazepam: NOT DETECTED ng/mg{creat}
Desmethylflunitrazepam: NOT DETECTED ng/mg{creat}
Diazepam: NOT DETECTED ng/mg creat
ETHYL ALCOHOL Enzymatic: NEGATIVE g/dL
FENTANYL: NEGATIVE
Fentanyl: NOT DETECTED ng/mg creat
Flunitrazepam: NOT DETECTED ng/mg{creat}
Lorazepam: NOT DETECTED ng/mg{creat}
METHADONE IA: NEGATIVE ng/mL
METHADONE MTB IA: NEGATIVE ng/mL
Midazolam: NOT DETECTED ng/mg{creat}
Norbuprenorphine: NOT DETECTED ng/mg{creat}
Norfentanyl: NOT DETECTED ng/mg{creat}
Oxazepam: NOT DETECTED ng/mg{creat}
PHENCYCLIDINE IA: NEGATIVE ng/mL
TAPENTADOL, IA: NEGATIVE ng/mL
TRAMADOL IA: NEGATIVE ng/mL
Temazepam: NOT DETECTED ng/mg{creat}

## 2023-05-26 LAB — OXYCODONE CLASS, MS, UR RFX
Noroxycodone: 17700 ng/mg{creat}
Noroxymorphone: 2148 ng/mg{creat}
Oxycodone Class Confirmation: POSITIVE
Oxycodone: 3948 ng/mg{creat}
Oxymorphone: 4648 ng/mg{creat}

## 2023-05-29 ENCOUNTER — Encounter: Payer: Self-pay | Admitting: Family Medicine

## 2023-05-29 ENCOUNTER — Other Ambulatory Visit (HOSPITAL_COMMUNITY): Payer: Self-pay

## 2023-05-29 DIAGNOSIS — A549 Gonococcal infection, unspecified: Secondary | ICD-10-CM | POA: Insufficient documentation

## 2023-05-29 LAB — CERVICOVAGINAL ANCILLARY ONLY
Bacterial Vaginitis (gardnerella): POSITIVE — AB
Candida Glabrata: NEGATIVE
Candida Vaginitis: POSITIVE — AB
Chlamydia: NEGATIVE
Comment: NEGATIVE
Comment: NEGATIVE
Comment: NEGATIVE
Comment: NEGATIVE
Comment: NEGATIVE
Comment: NORMAL
Neisseria Gonorrhea: POSITIVE — AB
Trichomonas: NEGATIVE

## 2023-05-29 MED ORDER — FLUCONAZOLE 150 MG PO TABS
150.0000 mg | ORAL_TABLET | Freq: Once | ORAL | 0 refills | Status: AC
Start: 2023-05-29 — End: 2023-05-29
  Filled 2023-05-29: qty 1, 1d supply, fill #0

## 2023-05-29 NOTE — Progress Notes (Signed)
No unexpected substances but unfortunately buprenorphine concentrations show patient is not taking her medicine consistently. Prior results again reviewed in detail and during her pregnancy showed consistently normal ratios. Over the past few months since she gave a UDS with no suboxone in it I have been very clear with Tracey Ho that I suspect she is not taking her suboxone as prescribed and that abnormal UDS (which we discussed would be either absence of suboxone or abnormal ratios) would lead to me no longer being able to prescribe this medication for her. Unfortunately we have reached that situation, I will notify by MyChart and offer appointment to discuss further.

## 2023-05-29 NOTE — Addendum Note (Signed)
Addended by: Merian Capron on: 05/29/2023 10:16 AM   Modules accepted: Orders

## 2023-06-03 ENCOUNTER — Other Ambulatory Visit (HOSPITAL_COMMUNITY): Payer: Self-pay

## 2023-06-05 ENCOUNTER — Other Ambulatory Visit: Payer: Self-pay

## 2023-06-05 ENCOUNTER — Ambulatory Visit (INDEPENDENT_AMBULATORY_CARE_PROVIDER_SITE_OTHER): Payer: Self-pay | Admitting: *Deleted

## 2023-06-05 VITALS — BP 128/84 | HR 86 | Ht 66.0 in | Wt 202.4 lb

## 2023-06-05 DIAGNOSIS — A549 Gonococcal infection, unspecified: Secondary | ICD-10-CM

## 2023-06-05 MED ORDER — CEFTRIAXONE SODIUM 500 MG IJ SOLR
500.0000 mg | Freq: Once | INTRAMUSCULAR | Status: AC
Start: 2023-06-05 — End: 2023-06-05
  Administered 2023-06-05: 500 mg via INTRAMUSCULAR

## 2023-06-05 NOTE — Progress Notes (Signed)
Here for treatment of gonorrhea. She has not picked up meds yet for BV and yeast but will pick them up and take asap. Rocephin IM given per protocol. She tolerated injection without complaint. Advised to have partner treated as well. Advised to get test of cure in 3 months. She voices understanding. Nancy Fetter

## 2023-06-10 ENCOUNTER — Other Ambulatory Visit (HOSPITAL_COMMUNITY): Payer: Self-pay

## 2023-07-09 ENCOUNTER — Other Ambulatory Visit (HOSPITAL_COMMUNITY): Payer: Self-pay

## 2023-09-24 NOTE — L&D Delivery Note (Signed)
 LABOR COURSE 28 y.o. female [phone] with IUP at [redacted]w[redacted]d admitted for IOL 2/2 IUGR  Delivery Note Called to room and patient was complete and pushing. Head delivered OA. One nuchal cord present and reduced prior to delivery. Shoulder and body delivered in usual fashion. At  2051 a viable female was delivered via Vaginal, Spontaneous (Presentation: Vertex).  Infant with spontaneous cry, placed on mother's abdomen, dried and stimulated. Cord clamped x 2 after >1-minute delay, and cut by patient's sister. Cord blood drawn. Placenta delivered spontaneously with gentle cord traction. Trailing membranes present. Appears intact. Fundus firm with massage and Pitocin . Labia, perineum, vagina, and cervix inspected with no lacerations noted.   Post placental Mirena  IUD was placed by Suzen Gentry, CNM and tolerated well.    APGAR: 8, 8; 2590g .   Cord: 3VC with the following complications:none.    Anesthesia: none Episiotomy: None Lacerations: None Suture Repair: NA Est. Blood Loss (mL): 350cc  Mom to postpartum.  Baby to Couplet care / Skin to Skin.  Lona Merritts, MD 05/19/24 9:21 PM

## 2023-10-01 ENCOUNTER — Ambulatory Visit: Payer: Self-pay

## 2023-10-01 DIAGNOSIS — N912 Amenorrhea, unspecified: Secondary | ICD-10-CM

## 2023-10-01 DIAGNOSIS — Z3200 Encounter for pregnancy test, result unknown: Secondary | ICD-10-CM

## 2023-10-01 DIAGNOSIS — Z3201 Encounter for pregnancy test, result positive: Secondary | ICD-10-CM

## 2023-10-01 LAB — POCT PREGNANCY, URINE: Preg Test, Ur: POSITIVE — AB

## 2023-10-01 NOTE — Progress Notes (Addendum)
 Possible Pregnancy  Patient dropped of urine today for pregnancy confirmation. UPT in office today is positive. Pt reports first positive home UPT 09/24/2023 . Reviewed dating with patient:   LMP: Approximate 08/19/23  EDD: 05/25/2024 6w 1d today  OB history reviewed. Reviewed medications and allergies with patient.  Recommended pt begin prenatal vitamin and schedule prenatal care. Patient voiced that she plans to get over the counter prenatal vitamins. Patient denies any vaginal bleeding and/or abdominal pain. Advised patient to go to the MAU if she experiences any heavy vaginal bleeding and/or abdominal pain. Patient verbalized understanding.   Dating and viability US  scheduled for 1/23 at 1330 at [facility]. Patient aware of schedule appointment. Also informed patient that no imaging will be printed for patient. Patient verbalized understanding.  Rosaline Pendleton, RN 10/01/2023  3:16 PM

## 2023-10-08 ENCOUNTER — Telehealth: Payer: Self-pay | Admitting: Family Medicine

## 2023-10-08 NOTE — Telephone Encounter (Signed)
-----   Message from Nurse Durwin Gills sent at 10/01/2023  5:23 PM EST ----- Regarding: New OB intake Hi,  Can you please schedule patient for new OB intake. She is [redacted]w[redacted]d and EDD is 05/25/2024 thank you!   Moira Andrews, RN

## 2023-10-16 ENCOUNTER — Ambulatory Visit (HOSPITAL_COMMUNITY)
Admission: RE | Admit: 2023-10-16 | Discharge: 2023-10-16 | Disposition: A | Discharge status: Home / Self Care | Payer: Self-pay | Source: Ambulatory Visit | Attending: Family Medicine | Admitting: Family Medicine

## 2023-10-16 DIAGNOSIS — Z3200 Encounter for pregnancy test, result unknown: Secondary | ICD-10-CM | POA: Insufficient documentation

## 2023-10-30 ENCOUNTER — Other Ambulatory Visit (HOSPITAL_COMMUNITY): Payer: Self-pay

## 2023-10-30 ENCOUNTER — Telehealth: Payer: Self-pay | Admitting: General Practice

## 2023-10-30 DIAGNOSIS — O219 Vomiting of pregnancy, unspecified: Secondary | ICD-10-CM

## 2023-10-30 MED ORDER — PROMETHAZINE HCL 25 MG PO TABS
25.0000 mg | ORAL_TABLET | Freq: Four times a day (QID) | ORAL | 0 refills | Status: DC | PRN
  Filled 2023-10-30: qty 30, 8d supply, fill #0

## 2023-10-30 NOTE — Telephone Encounter (Signed)
 Patient called into front office reporting nausea/vomiting. Has tried a friends nausea patch behind the ear which didn't really help and took a friend's zofran  which helped some. Phenergan  sent to pharmacy per protocol. Also discussed dietary recommendations for nausea as well. Recommended she call back if not improved. Patient verbalized understanding.

## 2023-11-12 ENCOUNTER — Other Ambulatory Visit (HOSPITAL_COMMUNITY): Payer: Self-pay

## 2023-11-12 ENCOUNTER — Encounter (HOSPITAL_COMMUNITY): Payer: Self-pay | Admitting: *Deleted

## 2023-11-12 ENCOUNTER — Inpatient Hospital Stay (HOSPITAL_COMMUNITY)
Admission: AD | Admit: 2023-11-12 | Discharge: 2023-11-12 | Disposition: A | Discharge status: Home / Self Care | Payer: Self-pay | Attending: Obstetrics and Gynecology | Admitting: Obstetrics and Gynecology

## 2023-11-12 DIAGNOSIS — K047 Periapical abscess without sinus: Secondary | ICD-10-CM | POA: Insufficient documentation

## 2023-11-12 DIAGNOSIS — K5900 Constipation, unspecified: Secondary | ICD-10-CM | POA: Insufficient documentation

## 2023-11-12 DIAGNOSIS — Z3A11 11 weeks gestation of pregnancy: Secondary | ICD-10-CM | POA: Insufficient documentation

## 2023-11-12 DIAGNOSIS — O26891 Other specified pregnancy related conditions, first trimester: Secondary | ICD-10-CM

## 2023-11-12 DIAGNOSIS — O99611 Diseases of the digestive system complicating pregnancy, first trimester: Secondary | ICD-10-CM | POA: Insufficient documentation

## 2023-11-12 DIAGNOSIS — O98811 Other maternal infectious and parasitic diseases complicating pregnancy, first trimester: Secondary | ICD-10-CM | POA: Insufficient documentation

## 2023-11-12 HISTORY — DX: Depression, unspecified: F32.A

## 2023-11-12 LAB — COMPREHENSIVE METABOLIC PANEL
ALT: 9 U/L (ref 0–44)
AST: 12 U/L — ABNORMAL LOW (ref 15–41)
Albumin: 3.6 g/dL (ref 3.5–5.0)
Alkaline Phosphatase: 50 U/L (ref 38–126)
Anion gap: 8 (ref 5–15)
BUN: 5 mg/dL — ABNORMAL LOW (ref 6–20)
CO2: 23 mmol/L (ref 22–32)
Calcium: 9.1 mg/dL (ref 8.9–10.3)
Chloride: 100 mmol/L (ref 98–111)
Creatinine, Ser: 0.53 mg/dL (ref 0.44–1.00)
GFR, Estimated: >60 mL/min (ref >60)
Glucose, Bld: 87 mg/dL (ref 70–99)
Potassium: 3.6 mmol/L (ref 3.5–5.1)
Sodium: 131 mmol/L — ABNORMAL LOW (ref 135–145)
Total Bilirubin: 1.1 mg/dL (ref 0.0–1.2)
Total Protein: 7.3 g/dL (ref 6.5–8.1)

## 2023-11-12 LAB — URINALYSIS, ROUTINE W REFLEX MICROSCOPIC
Bilirubin Urine: NEGATIVE
Glucose, UA: NEGATIVE mg/dL
Hgb urine dipstick: NEGATIVE
Ketones, ur: 5 mg/dL — AB
Nitrite: NEGATIVE
Protein, ur: NEGATIVE mg/dL
Specific Gravity, Urine: 1.017 (ref 1.005–1.030)
pH: 5 (ref 5.0–8.0)

## 2023-11-12 LAB — CBC WITH DIFFERENTIAL/PLATELET
Abs Immature Granulocytes: 0.01 10*3/uL (ref 0.00–0.07)
Basophils Absolute: 0 10*3/uL (ref 0.0–0.1)
Basophils Relative: 0 %
Eosinophils Absolute: 0.1 10*3/uL (ref 0.0–0.5)
Eosinophils Relative: 2 %
HCT: 29.8 % — ABNORMAL LOW (ref 36.0–46.0)
Hemoglobin: 9.1 g/dL — ABNORMAL LOW (ref 12.0–15.0)
Immature Granulocytes: 0 %
Lymphocytes Relative: 25 %
Lymphs Abs: 1.2 10*3/uL (ref 0.7–4.0)
MCH: 23 pg — ABNORMAL LOW (ref 26.0–34.0)
MCHC: 30.5 g/dL (ref 30.0–36.0)
MCV: 75.3 fL — ABNORMAL LOW (ref 80.0–100.0)
Monocytes Absolute: 0.5 10*3/uL (ref 0.1–1.0)
Monocytes Relative: 11 %
Neutro Abs: 2.8 10*3/uL (ref 1.7–7.7)
Neutrophils Relative %: 62 %
Platelets: 226 10*3/uL (ref 150–400)
RBC: 3.96 MIL/uL (ref 3.87–5.11)
RDW: 16.1 % — ABNORMAL HIGH (ref 11.5–15.5)
WBC: 4.6 10*3/uL (ref 4.0–10.5)
nRBC: 0 % (ref 0.0–0.2)

## 2023-11-12 LAB — RH IG WORKUP (INCLUDES ABO/RH)
ABO/RH(D): O NEG
Antibody Screen: NEGATIVE
Gestational Age(Wks): 11
Unit division: 0

## 2023-11-12 LAB — KLEIHAUER-BETKE STAIN
# Vials RhIg: 1
Fetal Cells %: 0 %
Quantitation Fetal Hemoglobin: 0 mL

## 2023-11-12 LAB — LIPASE, BLOOD: Lipase: 25 U/L (ref 11–51)

## 2023-11-12 MED ORDER — ONDANSETRON 4 MG PO TBDP
8.0000 mg | ORAL_TABLET | Freq: Once | ORAL | Status: AC
  Administered 2023-11-12: 8 mg via ORAL
  Filled 2023-11-12: qty 2

## 2023-11-12 MED ORDER — ONDANSETRON HCL 4 MG PO TABS
8.0000 mg | ORAL_TABLET | Freq: Two times a day (BID) | ORAL | 0 refills | Status: DC
  Filled 2023-11-12: qty 20, 5d supply, fill #0

## 2023-11-12 MED ORDER — ACETAMINOPHEN-CODEINE 300-30 MG PO TABS
1.0000 | ORAL_TABLET | Freq: Four times a day (QID) | ORAL | 0 refills | Status: AC | PRN
  Filled 2023-11-12: qty 12, 3d supply, fill #0

## 2023-11-12 MED ORDER — CEPHALEXIN 500 MG PO CAPS
500.0000 mg | ORAL_CAPSULE | Freq: Four times a day (QID) | ORAL | 0 refills | Status: DC
  Filled 2023-11-12: qty 20, 5d supply, fill #0

## 2023-11-12 MED ORDER — BISACODYL 5 MG PO TBEC
5.0000 mg | DELAYED_RELEASE_TABLET | Freq: Every day | ORAL | 0 refills | Status: DC | PRN
  Filled 2023-11-12: qty 14, 14d supply, fill #0

## 2023-11-12 NOTE — Discharge Instructions (Signed)
                                                   Dentists Accepting Pregnant Patients  [facility] Department [facility]) [facility] [address] Sherian Maroon [phone] Up to 26yo; 2nd trimester only Language line available Fax referral to [phone] Medicaid, sliding scale  [facility] [address]/ [phone] OR  [address] F/ [phone] Up to 26yo only Language line available  Medicaid, [place] Health Choice, dental insurance  [facility] [place] [address] [place] [phone] Up to 26yo only Language line available  Medicaid, [place] Health Choice, sliding scale, dental insurance  [facility] [address] [phone] Up to 26yo only Language line available  Medicaid, [place] Health Choice, sliding scale  [facility] [place] [address] [phone] Up to 26yo only Language line available  Medicaid, [place] Health Choice, dental insurance  [place] [facility] [address] [phone] Up to 26yo only Language line available  Medicaid, [place] Health Choice, dental insurance, sliding scale  [facility] [place] [address] [phone] Up to 26yo only Language line available  Medicaid, [place] Health Choice, dental insurance          [email or web address]  [email or web address]

## 2023-11-12 NOTE — MAU Provider Note (Cosign Needed Addendum)
 S [name] is a 28 y.o. [phone] patient who presents to MAU today with complaint of severe dental pain in upper and lower left side of her mouth. She is currently with an ice pack on her mouth. She does not have a dentist and is concerned d/t no Medicaid currently.  Patient also reported some vaginal bleeding 2 days ago and she is known RH Negative. She also has a known subchorionic hemorrhage. She reported taking Aleve and Ibuprofen OTC although she states she knows she shouldn't take it in pregnancy but was in a lot of pain and had nothing else at home to take. Also c/o constipation and reported last BM 1.5 weeks ago and denies taking any medication for constipation. NOB scheduled on 11/18/23   O BP 122/79 (BP Location: Right Arm)   Pulse 91   Temp 98.7 F (37.1 C) (Oral)   Resp 18   Ht 5\' 6"  (1.676 m)   Wt 91 kg   LMP 08/19/2023 (Within Days)   SpO2 99%   BMI 32.38 kg/m  Physical Exam Vitals and nursing note reviewed.  Constitutional:      General: She is in acute distress.     Appearance: She is well-developed.  HENT:     Head: Normocephalic.     Mouth/Throat:     Mouth: Mucous membranes are dry.     Dentition: Abnormal dentition. Dental tenderness, gingival swelling and dental caries present.     Comments: Left side upper and lower poor dentition  Cardiovascular:     Rate and Rhythm: Normal rate and regular rhythm.  Pulmonary:     Effort: Pulmonary effort is normal.     Breath sounds: Normal breath sounds.  Abdominal:     Palpations: Abdomen is soft.  Skin:    General: Skin is warm and dry.  Neurological:     Mental Status: She is alert and oriented to person, place, and time.  Psychiatric:        Mood and Affect: Mood is anxious.   Declined Pelvic exam   FHRT 165 by Doppler   Orders Placed This Encounter  Procedures   Urinalysis, Routine w reflex microscopic -Urine, Clean Catch    Standing Status:   Standing    Number of Occurrences:   1    Specimen  Source:   Urine, Clean Catch [76]   Kleihauer-Betke stain    Standing Status:   Standing    Number of Occurrences:   1   Comprehensive metabolic panel    Standing Status:   Standing    Number of Occurrences:   1   Lipase, blood    Standing Status:   Standing    Number of Occurrences:   1   CBC with Differential/Platelet    Standing Status:   Standing    Number of Occurrences:   1   Rh IG workup (includes ABO/Rh)    Standing Status:   Standing    Number of Occurrences:   1    Weeks of Gestation:   11    RhIG indication::   Routine Prenatal or 1st/2nd Trimester Bleeding   Discharge patient Discharge disposition: 01-Home or Self Care; Discharge patient date: 11/12/2023    Standing Status:   Standing    Number of Occurrences:   1    Discharge disposition:   01-Home or Self Care [1]    Discharge patient date:   11/12/2023     Results for orders placed or  performed during the hospital encounter of 11/12/23 (from the past 24 hours)  Rh IG workup (includes ABO/Rh)     Status: None   Collection Time: 11/12/23  9:49 AM  Result Value Ref Range   Gestational Age(Wks) 11    ABO/RH(D) O NEG    Antibody Screen      NEG Performed at [facility], [address], [place], [place] [address]    Unit Number [id number]    Blood Component Type RHIG    Unit division 00    Status of Unit REL FROM [facility]    Transfusion Status OK TO TRANSFUSE   Kleihauer-Betke stain     Status: None   Collection Time: 11/12/23  9:53 AM  Result Value Ref Range   Fetal Cells % 0 %   Quantitation Fetal Hemoglobin 0.0000 mL   # Vials RhIg 1   Comprehensive metabolic panel     Status: Abnormal   Collection Time: 11/12/23  9:53 AM  Result Value Ref Range   Sodium 131 (L) 135 - 145 mmol/L   Potassium 3.6 3.5 - 5.1 mmol/L   Chloride 100 98 - 111 mmol/L   CO2 23 22 - 32 mmol/L   Glucose, Bld 87 70 - 99 mg/dL   BUN 5 (L) 6 - 20 mg/dL   Creatinine, Ser 4.78 0.44 - 1.00 mg/dL   Calcium 9.1 8.9 - 29.5 mg/dL    Total Protein 7.3 6.5 - 8.1 g/dL   Albumin 3.6 3.5 - 5.0 g/dL   AST 12 (L) 15 - 41 U/L   ALT 9 0 - 44 U/L   Alkaline Phosphatase 50 38 - 126 U/L   Total Bilirubin 1.1 0.0 - 1.2 mg/dL   GFR, Estimated >62 >13 mL/min   Anion gap 8 5 - 15  Lipase, blood     Status: None   Collection Time: 11/12/23  9:53 AM  Result Value Ref Range   Lipase 25 11 - 51 U/L  CBC with Differential/Platelet     Status: Abnormal   Collection Time: 11/12/23  9:53 AM  Result Value Ref Range   WBC 4.6 4.0 - 10.5 K/uL   RBC 3.96 3.87 - 5.11 MIL/uL   Hemoglobin 9.1 (L) 12.0 - 15.0 g/dL   HCT 08.6 (L) 57.8 - 46.9 %   MCV 75.3 (L) 80.0 - 100.0 fL   MCH 23.0 (L) 26.0 - 34.0 pg   MCHC 30.5 30.0 - 36.0 g/dL   RDW 62.9 (H) 52.8 - 41.3 %   Platelets 226 150 - 400 K/uL   nRBC 0.0 0.0 - 0.2 %   Neutrophils Relative % 62 %   Neutro Abs 2.8 1.7 - 7.7 K/uL   Lymphocytes Relative 25 %   Lymphs Abs 1.2 0.7 - 4.0 K/uL   Monocytes Relative 11 %   Monocytes Absolute 0.5 0.1 - 1.0 K/uL   Eosinophils Relative 2 %   Eosinophils Absolute 0.1 0.0 - 0.5 K/uL   Basophils Relative 0 %   Basophils Absolute 0.0 0.0 - 0.1 K/uL   Immature Granulocytes 0 %   Abs Immature Granulocytes 0.01 0.00 - 0.07 K/uL  Urinalysis, Routine w reflex microscopic -Urine, Clean Catch     Status: Abnormal   Collection Time: 11/12/23 10:05 AM  Result Value Ref Range   Color, Urine YELLOW YELLOW   APPearance HAZY (A) CLEAR   Specific Gravity, Urine 1.017 1.005 - 1.030   pH 5.0 5.0 - 8.0   Glucose, UA  NEGATIVE NEGATIVE mg/dL   Hgb urine dipstick NEGATIVE NEGATIVE   Bilirubin Urine NEGATIVE NEGATIVE   Ketones, ur 5 (A) NEGATIVE mg/dL   Protein, ur NEGATIVE NEGATIVE mg/dL   Nitrite NEGATIVE NEGATIVE   Leukocytes,Ua MODERATE (A) NEGATIVE   RBC / HPF 0-5 0 - 5 RBC/hpf   WBC, UA 11-20 0 - 5 WBC/hpf   Bacteria, UA MANY (A) NONE SEEN   Squamous Epithelial / HPF 6-10 0 - 5 /HPF   Mucus PRESENT    Hyaline Casts, UA PRESENT     MDM: MODERATE  Labs/  U/A and Culture sent RH Rhogam w/up ordered Exam Counseling and educational information provided   I have reviewed the patient chart and performed the physical exam . I have ordered & interpreted the lab results  Medications ordered as stated below.  A/P as described below.  Counseling and education provided and patient agreeable  with plan as described below. Verbalized understanding.     ASSESSMENT  Medical screening exam complete   1. Tooth infection (Primary)  2. Constipation, unspecified constipation type  3. [redacted] weeks gestation of pregnancy   PLAN  Meds ordered this encounter  Medications   ondansetron (ZOFRAN-ODT) disintegrating tablet 8 mg   cephALEXin (KEFLEX) 500 MG capsule    Sig: Take 1 capsule (500 mg total) by mouth 4 (four) times daily.    Dispense:  20 capsule    Refill:  0    Supervising Provider:   Reva Bores [[id number]]   bisacodyl (DULCOLAX) 5 MG EC tablet    Sig: Take 1 tablet (5 mg total) by mouth daily as needed for moderate constipation.    Dispense:  14 tablet    Refill:  0    Supervising Provider:   Reva Bores [[id number]]   acetaminophen-codeine (TYLENOL #3) 300-30 MG tablet    Sig: Take 1 tablet by mouth every 6 (six) hours as needed for up to 3 days for moderate pain (pain score 4-6).    Dispense:  12 tablet    Refill:  0    Supervising Provider:   Reva Bores [[id number]]   ondansetron (ZOFRAN) 4 MG tablet    Sig: Take 2 tablets (8 mg total) by mouth 2 (two) times daily.    Dispense:  20 tablet    Refill:  0    Supervising Provider:   Reva Bores [[id number]]       Discharge from MAU in stable condition with information provided for Dentists in the area for dental appointment to further evaluate and mange her dental pain  Information for [facility] Medicaid given ( Please see AVS for all information provided including counseling and educational material)  Patient given the option of transfer to [facility] for further evaluation or seek  care in outpatient facility of choice - NOB 2/25 scheduled   Warning signs for worsening condition that would warrant emergency follow-up discussed Patient may return to MAU as needed   - Patient counseled in detail regarding need for Rhogam ( Blood type is O NEGATIVE with POS antibody) This result was copied from [facility] 07/2022  Labs previously ordered and Rhogam for spotting  in pregnancy. Patient was counseled verbally and with written information on RH Negative Blood type and Need for Rhogam.  - Patient REFUSED Rhogam ( Witnessed by RN SpurlockCarolin Sicks)   Patient discharged in Stable condition and verbalized understanding and return precautions  Colman Cater, NP 11/12/2023 5:27 PM

## 2023-11-12 NOTE — MAU Note (Addendum)
 Tracey Ho is a 28 y.o. at [redacted]w[redacted]d here in MAU reporting: left side of mouth hurting all night, not sure which tooth is causing the problem.  Tried some orajel, but it's wearing off.  Has been sick, can't keep anything down.  Has promethazine, can't take it because it makes her too sleepy. Stomach cramps, been going on for a few wks. Had some bleeding 2 days ago. (Known subchorionic hemorrhage). Has tried aleve and Ibuprofen. Has been constipated, states last BM was 2 wks ago.   Onset of complaint: ongoing cramping and nausea, mouth pain last night Pain score: 10, cramping 5 Vitals:   11/12/23 0925  BP: 123/77  Pulse: (!) 102  Resp: 18  Temp: 98.7 F (37.1 C)  SpO2: 99%     FHT:165 Lab orders placed from triage:  urine  Does not have a dentist.    Abd soft on palpation, no guarding or apparent tenderness.

## 2023-11-18 ENCOUNTER — Telehealth: Payer: Self-pay

## 2023-11-18 DIAGNOSIS — Z3A12 12 weeks gestation of pregnancy: Secondary | ICD-10-CM

## 2023-11-18 DIAGNOSIS — Z3490 Encounter for supervision of normal pregnancy, unspecified, unspecified trimester: Secondary | ICD-10-CM | POA: Insufficient documentation

## 2023-11-18 DIAGNOSIS — O0991 Supervision of high risk pregnancy, unspecified, first trimester: Secondary | ICD-10-CM

## 2023-11-18 DIAGNOSIS — O0990 Supervision of high risk pregnancy, unspecified, unspecified trimester: Secondary | ICD-10-CM | POA: Insufficient documentation

## 2023-11-18 NOTE — Progress Notes (Signed)
  New OB Intake  I connected with Tracey Ho  on 11/18/23 at  1:15 PM EST by MyChart Video Visit and verified that I am speaking with the correct person using two identifiers. Nurse is located at [facility] and pt is located at home.  I discussed the limitations, risks, security and privacy concerns of performing an evaluation and management service by telephone and the availability of in person appointments. I also discussed with the patient that there may be a patient responsible charge related to this service. The patient expressed understanding and agreed to proceed.  I explained I am completing New OB Intake today. We discussed EDD of 06/01/2024, Date entered prior to episode creation. Pt is [name]. I reviewed her allergies, medications and Medical/Surgical/OB history.    Patient Active Problem List   Diagnosis Date Noted   Supervision of high risk pregnancy, antepartum 11/18/2023   Gonorrhea 05/29/2023   BV (bacterial vaginosis) 05/22/2023   Hidradenitis suppurativa 03/25/2023   Trichomonal vaginitis 03/25/2023   Anxiety 02/18/2023   Ingrown hair 02/18/2023   History of gestational hypertension 10/17/2022   Unwanted fertility 05/02/2022   Opioid use disorder 03/27/2022   Recurrent genital herpes 10/22/2020   History of suicide attempt 10/09/2012   MDD (major depressive disorder), single episode, moderate (HCC) 08/04/2012   ADHD (attention deficit hyperactivity disorder), combined type 08/04/2012   Oppositional defiant disorder 08/04/2012   Cannabis abuse 08/04/2012    Concerns addressed today  Delivery Plans Plans to deliver at [facility]. Discussed the nature of our practice with multiple providers including residents and students. Due to the size of the practice, the delivering provider may not be the same as those providing prenatal care.   Patient is not interested in water birth. Offered upcoming OB visit with CNM to discuss further.  MyChart/Babyscripts MyChart access  verified. I explained pt will have some visits in office and some virtually. Babyscripts instructions given and order placed. Patient verifies receipt of registration text/e-mail. Account successfully created and app downloaded. If patient is a candidate for Optimized scheduling, add to sticky note.   Blood Pressure Cuff/Weight Scale Patient is self-pay; explained patient will be given BP cuff at first prenatal appt. Explained after first prenatal appt pt will check weekly and document in Babyscripts. Patient does have weight scale.  Anatomy US Explained first scheduled [place] will be around 19 weeks. Anatomy US scheduled for 01/08/24 at 100p.  Is patient a CenteringPregnancy candidate?    Not a candidate due to Complex coordination of care needed Opioid user If accepted,    Is patient a Mom+Baby Combined Care candidate?  Not a candidate   If accepted, confirm patient does not intend to move from the area for at least 12 months, then notify Mom+Baby staff  Interested in [place]? If yes, send referral and doula dot phrase.   Is patient a candidate for Babyscripts Optimization?    First visit review I reviewed new OB appt with patient. Explained pt will be seen by Dr.Eckstat at first visit. Discussed Avelina Laine genetic screening with patient. Need Panorama and Horizon.. Routine prenatal labs  needed at [facility] OB visit.    Last Pap Diagnosis  Date Value Ref Range Status  03/27/2022   Final   - Negative for intraepithelial lesion or malignancy (NILM)    Tracey Ho, CMA 11/18/2023  1:43 PM

## 2023-11-25 ENCOUNTER — Ambulatory Visit (INDEPENDENT_AMBULATORY_CARE_PROVIDER_SITE_OTHER): Payer: Self-pay | Admitting: Family Medicine

## 2023-11-25 ENCOUNTER — Encounter: Payer: Self-pay | Admitting: Family Medicine

## 2023-11-25 ENCOUNTER — Other Ambulatory Visit (HOSPITAL_COMMUNITY)
Admission: RE | Admit: 2023-11-25 | Discharge: 2023-11-25 | Disposition: A | Discharge status: Home / Self Care | Payer: MEDICAID | Source: Ambulatory Visit | Attending: Family Medicine | Admitting: Family Medicine

## 2023-11-25 ENCOUNTER — Other Ambulatory Visit (HOSPITAL_COMMUNITY): Payer: Self-pay

## 2023-11-25 VITALS — BP 113/75 | HR 91 | Wt 201.4 lb

## 2023-11-25 DIAGNOSIS — O0990 Supervision of high risk pregnancy, unspecified, unspecified trimester: Secondary | ICD-10-CM | POA: Insufficient documentation

## 2023-11-25 DIAGNOSIS — Z3A13 13 weeks gestation of pregnancy: Secondary | ICD-10-CM

## 2023-11-25 DIAGNOSIS — Z1332 Encounter for screening for maternal depression: Secondary | ICD-10-CM

## 2023-11-25 DIAGNOSIS — O0991 Supervision of high risk pregnancy, unspecified, first trimester: Secondary | ICD-10-CM

## 2023-11-25 DIAGNOSIS — N898 Other specified noninflammatory disorders of vagina: Secondary | ICD-10-CM | POA: Insufficient documentation

## 2023-11-25 DIAGNOSIS — O98311 Other infections with a predominantly sexual mode of transmission complicating pregnancy, first trimester: Secondary | ICD-10-CM

## 2023-11-25 DIAGNOSIS — Z8759 Personal history of other complications of pregnancy, childbirth and the puerperium: Secondary | ICD-10-CM

## 2023-11-25 DIAGNOSIS — A6000 Herpesviral infection of urogenital system, unspecified: Secondary | ICD-10-CM

## 2023-11-25 DIAGNOSIS — Z8632 Personal history of gestational diabetes: Secondary | ICD-10-CM

## 2023-11-25 DIAGNOSIS — F1190 Opioid use, unspecified, uncomplicated: Secondary | ICD-10-CM

## 2023-11-25 DIAGNOSIS — A568 Sexually transmitted chlamydial infection of other sites: Secondary | ICD-10-CM

## 2023-11-25 DIAGNOSIS — O99012 Anemia complicating pregnancy, second trimester: Secondary | ICD-10-CM

## 2023-11-25 DIAGNOSIS — N760 Acute vaginitis: Secondary | ICD-10-CM

## 2023-11-25 DIAGNOSIS — Z3009 Encounter for other general counseling and advice on contraception: Secondary | ICD-10-CM

## 2023-11-25 DIAGNOSIS — O99011 Anemia complicating pregnancy, first trimester: Secondary | ICD-10-CM

## 2023-11-25 DIAGNOSIS — B9689 Other specified bacterial agents as the cause of diseases classified elsewhere: Secondary | ICD-10-CM

## 2023-11-25 MED ORDER — POLYETHYLENE GLYCOL 3350 17 GM/SCOOP PO POWD
17.0000 g | Freq: Every day | ORAL | 2 refills | Status: AC
  Filled 2023-11-25: qty 238, 14d supply, fill #0

## 2023-11-25 MED ORDER — BUPRENORPHINE HCL-NALOXONE HCL 8-2 MG SL FILM
1.0000 | ORAL_FILM | Freq: Three times a day (TID) | SUBLINGUAL | 0 refills | Status: DC
  Filled 2023-11-25: qty 30, 10d supply, fill #0

## 2023-11-25 MED ORDER — ASPIRIN 81 MG PO TBEC
162.0000 mg | DELAYED_RELEASE_TABLET | Freq: Every day | ORAL | 12 refills | Status: DC
  Filled 2023-11-25: qty 60, 30d supply, fill #0

## 2023-11-25 MED ORDER — ONDANSETRON 4 MG PO TBDP
4.0000 mg | ORAL_TABLET | Freq: Four times a day (QID) | ORAL | 5 refills | Status: DC | PRN
  Filled 2023-11-25: qty 20, 5d supply, fill #0

## 2023-11-25 MED ORDER — VALACYCLOVIR HCL 1 G PO TABS
1000.0000 mg | ORAL_TABLET | Freq: Every day | ORAL | 2 refills | Status: AC
Start: 2023-11-25 — End: 2023-12-01
  Filled 2023-11-25: qty 5, 5d supply, fill #0

## 2023-11-25 NOTE — Progress Notes (Signed)
 Subjective:   Tracey Ho is a 28 y.o. [phone] at [redacted]w[redacted]d by 7 wk [place] being seen today for her first obstetrical visit.  Her obstetrical history is significant for  short interval pregnancy, anxiety/depression, substance use disorder, HSV . Patient does not intend to breast feed. Pregnancy history fully reviewed.  Patient reports she is angry at herself for being pregnant again so soon and also for going back to using pills. Acknowledges when we last saw each other she was not taking suboxone. Reports she has gone back to using oxycodone pills, sometimes up to 200 mg a day. Reports significant increase in tolerance and withdrawal symptoms.   HISTORY: OB History  Gravida Para Term Preterm AB Living  6 3 3  0 2 3  SAB IAB Ectopic Multiple Live Births  2 0 0 0 3    # Outcome Date GA Lbr Len/2nd Weight Sex Type Anes PTL Lv  6 Current           5 Term 08/09/22 [redacted]w[redacted]d   M Vag-Spont   LIV  4 Term 2017     Vag-Spont   LIV  3 Term 2016     Vag-Spont   LIV     Birth Comments: System Generated. Please review and update pregnancy details.  2 SAB 2014          1 SAB 2014             Last pap smear: Result Date Procedure Results Follow-ups  03/27/2022 Cytology - PAP( [facility]) Neisseria Gonorrhea: Negative Chlamydia: Negative Trichomonas: Negative Adequacy: Satisfactory for evaluation; transformation zone component PRESENT. Diagnosis: - Negative for intraepithelial lesion or malignancy (NILM) Comment: Normal Reference Ranger Chlamydia - Negative Comment: Normal Reference Range Neisseria Gonorrhea - Negative Comment: Normal Reference Range Trichomonas - Negative      Past Medical History:  Diagnosis Date   ADHD (attention deficit hyperactivity disorder)    Anemia    Depression    Migraine headache    PID (acute pelvic inflammatory disease) 10/22/2020   Past Surgical History:  Procedure Laterality Date   NERVE SURGERY     NO PAST SURGERIES     Family History  Problem Relation Age  of Onset   Hypertension Mother    Thyroid disease Mother    Healthy Father    Diabetes Maternal Aunt    Stroke Maternal Aunt    Hypertension Maternal Aunt    Stroke Maternal Grandmother    Hypertension Maternal Grandmother    Asthma Neg Hx    Cancer Neg Hx    Birth defects Neg Hx    Heart disease Neg Hx    Social History   Tobacco Use   Smoking status: Every Day    Current packs/day: 0.50    Types: Cigarettes   Smokeless tobacco: Never  Vaping Use   Vaping status: Former   Substances: Nicotine, Flavoring  Substance Use Topics   Alcohol use: Not Currently   Drug use: Yes    Types: Marijuana, Oxycodone    Comment: current oxycodone use; last marijuana use 5 + years ago   No Known Allergies Current Outpatient Medications on File Prior to Visit  Medication Sig Dispense Refill   ondansetron (ZOFRAN) 4 MG tablet Take 2 tablets (8 mg total) by mouth 2 (two) times daily. 20 tablet 0   valACYclovir (VALTREX) 1000 MG tablet Take 1 tablet (1,000 mg total) by mouth daily. 30 tablet 3   naloxone (NARCAN) nasal spray 4 mg/0.1  mL Use as needed to reverse opioid overdose (Patient not taking: Reported on 11/25/2023) 2 each 11   Prenatal Vit-Fe Fumarate-FA (MULTIVITAMIN-PRENATAL) 27-0.8 MG TABS tablet Take 1 tablet by mouth daily at 12 noon. (Patient not taking: Reported on 11/25/2023)     No current facility-administered medications on file prior to visit.     Exam   Vitals:   11/25/23 1433  BP: 113/75  Pulse: 91  Weight: 201 lb 6.4 oz (91.4 kg)      System: General: well-developed, well-nourished female in no acute distress   Skin: normal coloration and turgor, no rashes   Neurologic: oriented, normal, negative, normal mood   Extremities: normal strength, tone, and muscle mass, ROM of all joints is normal   HEENT PERRLA, extraocular movement intact and sclera clear, anicteric   Neck supple and no masses   Respiratory:  no respiratory distress      Assessment:   Pregnancy:  [id number] Patient Active Problem List   Diagnosis Date Noted   Supervision of high risk pregnancy, antepartum 11/18/2023   Hidradenitis suppurativa 03/25/2023   Anxiety 02/18/2023   History of gestational hypertension 10/17/2022   Unwanted fertility 05/02/2022   Opioid use disorder 03/27/2022   History of gestational diabetes 01/11/2022   Recurrent genital herpes 10/22/2020   History of suicide attempt 10/09/2012   MDD (major depressive disorder), single episode, moderate (HCC) 08/04/2012   ADHD (attention deficit hyperactivity disorder), combined type 08/04/2012   Cannabis abuse 08/04/2012     Plan:  1. Supervision of high risk pregnancy, antepartum (Primary) BP normal, FHR obtained and was normal but was inadvertently not documented Initial labs drawn. Continue prenatal vitamins. Genetic Screening discussed, NIPS: ordered. Ultrasound discussed; fetal anatomic survey: ordered. Problem list reviewed and updated. Having ongoing issues with constipation, rx sent for miralax Ongoing issues with teeth, given list of dental resources - polyethylene glycol powder (GLYCOLAX/MIRALAX) 17 GM/SCOOP powder; Mix 17 grams in beverage and take by mouth daily.  Dispense: 476 g; Refill: 2 - CBC/D/Plt+RPR+Rh+ABO+RubIgG... - HgB A1c - Comp Met (CMET) - Protein / creatinine ratio, urine - PANORAMA PRENATAL TEST - ondansetron (ZOFRAN-ODT) 4 MG disintegrating tablet; Dissolve 1 tablet (4 mg total) in mouth every 6 (six) hours as needed.  Dispense: 20 tablet; Refill: 5 - Cervicovaginal ancillary only  2. History of gestational hypertension Normotensive today Baseline labs Start ASA 162 mg daily, rx sent - Comp Met (CMET) - Protein / creatinine ratio, urine - aspirin EC 81 MG tablet; Take 2 tablets (162 mg total) by mouth daily. Swallow whole.  Dispense: 60 tablet; Refill: 12  3. Recurrent genital herpes Recent outbreak, treatment dose sent - valACYclovir (VALTREX) 1000 MG tablet; Take 1 tablet  (1,000 mg total) by mouth daily for 5 days.  Dispense: 5 tablet; Refill: 2  4. History of gestational diabetes A1c today  5. Opioid use disorder Thanked patient for honesty and bravery in discussing this issue Given lack of adherence at end of last pregnancy discussed we could restart suboxone but we should also consider methadone as a viable and more easily initiated option After discussion patient prefers suboxone which she was on in last pregnancy Discussed admission for suboxone induction vs home induction, patient preferred the latter We discussed in detail how to do this and I also gave written instructions UDS today with verbal consent Will see back in one week to see how she is doing with initiation - ToxAssure Flex 15, Ur - Buprenorphine HCl-Naloxone HCl (SUBOXONE) 8-2 MG FILM;  Place 1 Film under the tongue 3 (three) times daily for 10 days.  Dispense: 30 Film; Refill: 0  6. Vaginal odor Self swab - Cervicovaginal ancillary only  7. Unwanted fertility Desires BTL, sign papers at 28 weeks   Routine obstetric precautions reviewed. Return in about 1 week (around 12/02/2023) for REACH clinic.    Venora Maples, MD/MPH Attending Family Medicine Physician, [facility] for [facility], [facility]

## 2023-11-25 NOTE — Patient Instructions (Addendum)
 [email or web address] [address] [place], [place] [address] [phone]   Wait at least 12 hours, but ideally 24 hours before taking your first dose of suboxone. You should feel as sick as you can possibly tolerate when you take your first dose.  For your first dose take 16 mg (two 8 mg films) at once. You should feel better within an hour. If you do not feel better than take one more 8 mg film. Do not take more than 3 films in the first day.  The next day take the total amount of films you had the first day but divided up. So if you only needed two films, take one in the morning and one in the evening. If you needed three films then take one in the morning, one in the afternoon, and one in the evening.  Call or send me a mychart message with any questions or problems.

## 2023-11-26 LAB — COMPREHENSIVE METABOLIC PANEL
ALT: 7 IU/L (ref 0–32)
AST: 11 IU/L (ref 0–40)
Albumin: 4.2 g/dL (ref 4.0–5.0)
Alkaline Phosphatase: 68 IU/L (ref 44–121)
BUN/Creatinine Ratio: 11 (ref 9–23)
BUN: 6 mg/dL (ref 6–20)
Bilirubin Total: 0.5 mg/dL (ref 0.0–1.2)
CO2: 20 mmol/L (ref 20–29)
Calcium: 9.1 mg/dL (ref 8.7–10.2)
Chloride: 102 mmol/L (ref 96–106)
Creatinine, Ser: 0.56 mg/dL — ABNORMAL LOW (ref 0.57–1.00)
Globulin, Total: 3.4 g/dL (ref 1.5–4.5)
Glucose: 82 mg/dL (ref 70–99)
Potassium: 3.9 mmol/L (ref 3.5–5.2)
Sodium: 135 mmol/L (ref 134–144)
Total Protein: 7.6 g/dL (ref 6.0–8.5)
eGFR: 130 mL/min/{1.73_m2} (ref >59)

## 2023-11-26 LAB — CBC/D/PLT+RPR+RH+ABO+RUBIGG...
Antibody Screen: NEGATIVE
Basophils Absolute: 0 10*3/uL (ref 0.0–0.2)
Basos: 0 %
EOS (ABSOLUTE): 0 10*3/uL (ref 0.0–0.4)
Eos: 1 %
HCV Ab: NONREACTIVE
HIV Screen 4th Generation wRfx: NONREACTIVE
Hematocrit: 29.4 % — ABNORMAL LOW (ref 34.0–46.6)
Hemoglobin: 8.5 g/dL — ABNORMAL LOW (ref 11.1–15.9)
Hepatitis B Surface Ag: NEGATIVE
Immature Grans (Abs): 0 10*3/uL (ref 0.0–0.1)
Immature Granulocytes: 0 %
Lymphocytes Absolute: 0.8 10*3/uL (ref 0.7–3.1)
Lymphs: 17 %
MCH: 22 pg — ABNORMAL LOW (ref 26.6–33.0)
MCHC: 28.9 g/dL — ABNORMAL LOW (ref 31.5–35.7)
MCV: 76 fL — ABNORMAL LOW (ref 79–97)
Monocytes Absolute: 0.4 10*3/uL (ref 0.1–0.9)
Monocytes: 8 %
Neutrophils Absolute: 3.4 10*3/uL (ref 1.4–7.0)
Neutrophils: 74 %
Platelets: 273 10*3/uL (ref 150–450)
RBC: 3.87 x10E6/uL (ref 3.77–5.28)
RDW: 15.9 % — ABNORMAL HIGH (ref 11.7–15.4)
RPR Ser Ql: NONREACTIVE
Rh Factor: NEGATIVE
Rubella Antibodies, IGG: 3.18 {index} (ref >0.99)
WBC: 4.6 10*3/uL (ref 3.4–10.8)

## 2023-11-26 LAB — HCV INTERPRETATION

## 2023-11-26 LAB — HEMOGLOBIN A1C
Est. average glucose Bld gHb Est-mCnc: 114 mg/dL
Hgb A1c MFr Bld: 5.6 % (ref 4.8–5.6)

## 2023-11-27 LAB — CERVICOVAGINAL ANCILLARY ONLY
Bacterial Vaginitis (gardnerella): POSITIVE — AB
Candida Glabrata: NEGATIVE
Candida Vaginitis: NEGATIVE
Chlamydia: POSITIVE — AB
Comment: NEGATIVE
Comment: NEGATIVE
Comment: NEGATIVE
Comment: NEGATIVE
Comment: NEGATIVE
Comment: NORMAL
Neisseria Gonorrhea: NEGATIVE
Trichomonas: NEGATIVE

## 2023-11-27 LAB — PROTEIN / CREATININE RATIO, URINE
Creatinine, Urine: 191.9 mg/dL
Protein, Ur: 40.9 mg/dL
Protein/Creat Ratio: 213 mg/g{creat} — ABNORMAL HIGH (ref 0–200)

## 2023-11-28 ENCOUNTER — Encounter: Payer: Self-pay | Admitting: Family Medicine

## 2023-11-29 LAB — TOXASSURE FLEX 15, UR
6-ACETYLMORPHINE IA: NEGATIVE ng/mL
7-aminoclonazepam: NOT DETECTED ng/mg{creat}
AMPHETAMINES IA: NEGATIVE ng/mL
Alpha-hydroxyalprazolam: NOT DETECTED ng/mg{creat}
Alpha-hydroxymidazolam: NOT DETECTED ng/mg{creat}
Alpha-hydroxytriazolam: NOT DETECTED ng/mg{creat}
Alprazolam: NOT DETECTED ng/mg{creat}
BARBITURATES IA: NEGATIVE ng/mL
BUPRENORPHINE: NEGATIVE
Benzodiazepines: NEGATIVE
Buprenorphine: NOT DETECTED ng/mg{creat}
CANNABINOIDS IA: NEGATIVE ng/mL
COCAINE METABOLITE IA: NEGATIVE ng/mL
Clonazepam: NOT DETECTED ng/mg{creat}
Creatinine: 183 mg/dL
Desalkylflurazepam: NOT DETECTED ng/mg{creat}
Desmethyldiazepam: NOT DETECTED ng/mg{creat}
Desmethylflunitrazepam: NOT DETECTED ng/mg{creat}
Diazepam: NOT DETECTED ng/mg{creat}
ETHYL ALCOHOL Enzymatic: NEGATIVE g/dL
FENTANYL: NEGATIVE
Fentanyl: NOT DETECTED ng/mg{creat}
Flunitrazepam: NOT DETECTED ng/mg{creat}
Lorazepam: NOT DETECTED ng/mg{creat}
METHADONE IA: NEGATIVE ng/mL
METHADONE MTB IA: NEGATIVE ng/mL
Midazolam: NOT DETECTED ng/mg{creat}
Norbuprenorphine: NOT DETECTED ng/mg{creat}
Norfentanyl: NOT DETECTED ng/mg{creat}
Oxazepam: NOT DETECTED ng/mg{creat}
PHENCYCLIDINE IA: NEGATIVE ng/mL
TAPENTADOL, IA: NEGATIVE ng/mL
TRAMADOL IA: NEGATIVE ng/mL
Temazepam: NOT DETECTED ng/mg{creat}

## 2023-11-29 LAB — OPIATE CLASS, MS, UR RFX
Codeine: NOT DETECTED ng/mg{creat}
Dihydrocodeine: NOT DETECTED ng/mg{creat}
Hydrocodone: NOT DETECTED ng/mg{creat}
Hydromorphone: NOT DETECTED ng/mg{creat}
Morphine: NOT DETECTED ng/mg{creat}
Norcodeine: NOT DETECTED ng/mg{creat}
Norhydrocodone: NOT DETECTED ng/mg{creat}
Normorphine: NOT DETECTED ng/mg{creat}
Opiate Class Confirmation: NEGATIVE

## 2023-11-29 LAB — OXYCODONE CLASS, MS, UR RFX
Noroxycodone: >5464 ng/mg{creat}
Noroxymorphone: 2123 ng/mg{creat}
Oxycodone Class Confirmation: POSITIVE
Oxycodone: >5464 ng/mg{creat}
Oxymorphone: 1170 ng/mg{creat}

## 2023-12-01 ENCOUNTER — Encounter: Payer: Self-pay | Admitting: Family Medicine

## 2023-12-01 ENCOUNTER — Other Ambulatory Visit: Payer: Self-pay | Admitting: Family Medicine

## 2023-12-01 ENCOUNTER — Other Ambulatory Visit (HOSPITAL_COMMUNITY): Payer: Self-pay

## 2023-12-01 ENCOUNTER — Telehealth: Payer: Self-pay

## 2023-12-01 DIAGNOSIS — A568 Sexually transmitted chlamydial infection of other sites: Secondary | ICD-10-CM | POA: Insufficient documentation

## 2023-12-01 DIAGNOSIS — O98312 Other infections with a predominantly sexual mode of transmission complicating pregnancy, second trimester: Secondary | ICD-10-CM | POA: Insufficient documentation

## 2023-12-01 DIAGNOSIS — O99019 Anemia complicating pregnancy, unspecified trimester: Secondary | ICD-10-CM | POA: Insufficient documentation

## 2023-12-01 DIAGNOSIS — O99012 Anemia complicating pregnancy, second trimester: Secondary | ICD-10-CM | POA: Insufficient documentation

## 2023-12-01 DIAGNOSIS — N760 Acute vaginitis: Secondary | ICD-10-CM

## 2023-12-01 MED ORDER — METRONIDAZOLE 0.75 % VA GEL
1.0000 | Freq: Every day | VAGINAL | 0 refills | Status: AC
  Filled 2023-12-01 (×2): qty 70, 7d supply, fill #0

## 2023-12-01 MED ORDER — AZITHROMYCIN 250 MG PO TABS
1000.0000 mg | ORAL_TABLET | Freq: Once | ORAL | 0 refills | Status: AC
Start: 2023-12-01 — End: 2023-12-04
  Filled 2023-12-01: qty 4, 1d supply, fill #0

## 2023-12-01 NOTE — Addendum Note (Signed)
 Addended by: Merian Capron on: 12/01/2023 08:54 AM   Modules accepted: Orders

## 2023-12-01 NOTE — Telephone Encounter (Addendum)
-----   Message from Venora Maples sent at 12/01/2023  8:49 AM EDT ----- +Chlamydia I've notified by MyChart and sent prescription to her pharmacy  Please do any necessary public health reporting paperwork and follow up with patient by phone to ensure that she received the message and has taken treatment   Called patient; VM left stating I am calling to review results. Offered for patient to call or message with questions if needed. Chart review shows patient read message regarding positive Chlamydia result and need for treatment. Communicable disease report faxed to [facility] Department.

## 2023-12-02 ENCOUNTER — Other Ambulatory Visit: Payer: Self-pay

## 2023-12-02 ENCOUNTER — Encounter: Payer: Self-pay | Admitting: Family Medicine

## 2023-12-02 ENCOUNTER — Ambulatory Visit: Payer: Self-pay | Admitting: Family Medicine

## 2023-12-02 ENCOUNTER — Other Ambulatory Visit (HOSPITAL_COMMUNITY): Payer: Self-pay

## 2023-12-02 VITALS — BP 120/79 | HR 96 | Wt 197.4 lb

## 2023-12-02 DIAGNOSIS — A6000 Herpesviral infection of urogenital system, unspecified: Secondary | ICD-10-CM

## 2023-12-02 DIAGNOSIS — Z8632 Personal history of gestational diabetes: Secondary | ICD-10-CM

## 2023-12-02 DIAGNOSIS — F1190 Opioid use, unspecified, uncomplicated: Secondary | ICD-10-CM

## 2023-12-02 DIAGNOSIS — Z8759 Personal history of other complications of pregnancy, childbirth and the puerperium: Secondary | ICD-10-CM

## 2023-12-02 DIAGNOSIS — O98312 Other infections with a predominantly sexual mode of transmission complicating pregnancy, second trimester: Secondary | ICD-10-CM

## 2023-12-02 DIAGNOSIS — O99012 Anemia complicating pregnancy, second trimester: Secondary | ICD-10-CM

## 2023-12-02 DIAGNOSIS — B370 Candidal stomatitis: Secondary | ICD-10-CM

## 2023-12-02 DIAGNOSIS — O0992 Supervision of high risk pregnancy, unspecified, second trimester: Secondary | ICD-10-CM

## 2023-12-02 DIAGNOSIS — O0990 Supervision of high risk pregnancy, unspecified, unspecified trimester: Secondary | ICD-10-CM

## 2023-12-02 DIAGNOSIS — Z3009 Encounter for other general counseling and advice on contraception: Secondary | ICD-10-CM

## 2023-12-02 DIAGNOSIS — Z3A14 14 weeks gestation of pregnancy: Secondary | ICD-10-CM

## 2023-12-02 DIAGNOSIS — A568 Sexually transmitted chlamydial infection of other sites: Secondary | ICD-10-CM

## 2023-12-02 LAB — PANORAMA PRENATAL TEST FULL PANEL:PANORAMA TEST PLUS 5 ADDITIONAL MICRODELETIONS: FETAL FRACTION: 5.7

## 2023-12-02 MED ORDER — BUPRENORPHINE HCL-NALOXONE HCL 8-2 MG SL FILM
1.0000 | ORAL_FILM | Freq: Four times a day (QID) | SUBLINGUAL | 0 refills | Status: DC
  Filled 2023-12-02 – 2023-12-03 (×2): qty 40, 10d supply, fill #0

## 2023-12-02 NOTE — Patient Instructions (Signed)
 We discussed that dosing more consistently and evenly with your suboxone will help with your symptoms. Taken one 8 mg film of suboxone every six hours on the dot. We will see you back in one week.

## 2023-12-02 NOTE — Progress Notes (Unsigned)
   Subjective:  Tracey Ho is a 28 y.o. [phone] at [redacted]w[redacted]d being seen today for ongoing prenatal care.  She is currently monitored for the following issues for this high-risk pregnancy and has MDD (major depressive disorder), single episode, moderate (HCC); ADHD (attention deficit hyperactivity disorder), combined type; Cannabis abuse; Opioid use disorder; Unwanted fertility; Recurrent genital herpes; History of suicide attempt; History of gestational hypertension; Anxiety; Hidradenitis suppurativa; Supervision of high risk pregnancy, antepartum; History of gestational diabetes; Chlamydia trachomatis infection in mother during second trimester of pregnancy; and Anemia complicating pregnancy, second trimester on their problem list.  Patient reports no complaints.  Contractions: Not present. Vag. Bleeding: None.  Movement: Absent. Denies leaking of fluid.   The following portions of the patient's history were reviewed and updated as appropriate: allergies, current medications, past family history, past medical history, past social history, past surgical history and problem list. Problem list updated.  Objective:   Vitals:   12/02/23 1545  BP: 120/79  Pulse: 96  Weight: 197 lb 6 oz (89.5 kg)    Fetal Status: Fetal Heart Rate (bpm): 143   Movement: Absent     General:  Alert, oriented and cooperative. Patient is in no acute distress.  Skin: Skin is warm and dry. No rash noted.   Cardiovascular: Normal heart rate noted  Respiratory: Normal respiratory effort, no problems with respiration noted  Abdomen: Soft, gravid, appropriate for gestational age. Pain/Pressure: Present     Pelvic: Vag. Bleeding: None     Cervical exam deferred        Extremities: Normal range of motion.  Edema: Trace  Mental Status: Normal mood and affect. Normal behavior. Normal judgment and thought content.   Urinalysis:      PDMP not reviewed this encounter.***  Last UDS: Lab Results  Component Value Date    CREATIUR 183 11/26/2023     Assessment and Plan:  Pregnancy: [id number] at [redacted]w[redacted]d  1. Supervision of high risk pregnancy, antepartum (Primary) BP and FHR normal  2. History of gestational hypertension Normotensive, taking ASA  3. History of gestational diabetes Normal A1c at last visit  4. Recurrent genital herpes Taking valtrex, has resolved  5. Opioid use disorder Reports she has felt unwell since starting suboxone Waited twelve hours prior to taking first dose, was rough at first but then continued to be rough Also feeling mentally down Only took one with first dose, then took another one later that day Then after that tried to take more, was up to 4-5/day at times Has not taken any today, took 3 yesterday (2 in the morning, 1 in the afternoon) Also reports she took some oxy's last night and this morning ***8 qid>change to brand name ***swish and swallow nystatin  6. Chlamydia trachomatis infection in mother during second trimester of pregnancy Has not yet picked up medicine for infection diagnosed at last visit TOC in 4 weeks  7. Unwanted fertility Sign papers at 28 wks  8. Anemia complicating pregnancy, second trimester Lab Results  Component Value Date   HGB 8.5 (L) 11/25/2023   Will schedule for IV iron infusion  Preterm labor symptoms and general obstetric precautions including but not limited to vaginal bleeding, contractions, leaking of fluid and fetal movement were reviewed in detail with the patient. Please refer to After Visit Summary for other counseling recommendations.  No follow-ups on file.   Venora Maples, MD

## 2023-12-02 NOTE — Progress Notes (Unsigned)
 Met with Tracey Ho today at [facility] for initial substance exposed newborn consult. We discussed her ongoing prenatal care and current stressors in Tracey Ho's life. Oriented to [facility] Clinic room and resources. Began to discuss expectations following delivery. Tracey Ho expressed her difficulty with bonding with infant currently; desired abortion, however was post allowable gestational date. Offered support and encouragement in her presence to clinic today.   Encouraged Tracey Ho to contact [facility] consult phone with any questions or concerns in between appointments. She verbally agreed.   Dennison Bulla, NNP-BC

## 2023-12-03 ENCOUNTER — Other Ambulatory Visit (HOSPITAL_COMMUNITY): Payer: Self-pay

## 2023-12-03 ENCOUNTER — Encounter: Payer: Self-pay | Admitting: Family Medicine

## 2023-12-04 ENCOUNTER — Other Ambulatory Visit (HOSPITAL_COMMUNITY): Payer: Self-pay

## 2023-12-04 ENCOUNTER — Encounter: Payer: Self-pay | Admitting: Family Medicine

## 2023-12-04 DIAGNOSIS — B370 Candidal stomatitis: Secondary | ICD-10-CM

## 2023-12-04 HISTORY — DX: Candidal stomatitis: B37.0

## 2023-12-04 MED ORDER — NYSTATIN 100000 UNIT/ML MT SUSP
5.0000 mL | Freq: Four times a day (QID) | OROMUCOSAL | 0 refills | Status: DC
  Filled 2023-12-04: qty 120, 6d supply, fill #0

## 2023-12-09 ENCOUNTER — Ambulatory Visit (INDEPENDENT_AMBULATORY_CARE_PROVIDER_SITE_OTHER): Payer: Self-pay | Admitting: Family Medicine

## 2023-12-09 ENCOUNTER — Other Ambulatory Visit (HOSPITAL_COMMUNITY): Payer: Self-pay

## 2023-12-09 ENCOUNTER — Encounter: Payer: Self-pay | Admitting: Family Medicine

## 2023-12-09 VITALS — BP 121/76 | HR 75 | Wt 199.0 lb

## 2023-12-09 DIAGNOSIS — Z3009 Encounter for other general counseling and advice on contraception: Secondary | ICD-10-CM

## 2023-12-09 DIAGNOSIS — O98312 Other infections with a predominantly sexual mode of transmission complicating pregnancy, second trimester: Secondary | ICD-10-CM

## 2023-12-09 DIAGNOSIS — Z3A15 15 weeks gestation of pregnancy: Secondary | ICD-10-CM

## 2023-12-09 DIAGNOSIS — F321 Major depressive disorder, single episode, moderate: Secondary | ICD-10-CM

## 2023-12-09 DIAGNOSIS — O99012 Anemia complicating pregnancy, second trimester: Secondary | ICD-10-CM

## 2023-12-09 DIAGNOSIS — D509 Iron deficiency anemia, unspecified: Secondary | ICD-10-CM | POA: Insufficient documentation

## 2023-12-09 DIAGNOSIS — O99320 Drug use complicating pregnancy, unspecified trimester: Secondary | ICD-10-CM | POA: Insufficient documentation

## 2023-12-09 DIAGNOSIS — F1190 Opioid use, unspecified, uncomplicated: Secondary | ICD-10-CM

## 2023-12-09 DIAGNOSIS — A568 Sexually transmitted chlamydial infection of other sites: Secondary | ICD-10-CM

## 2023-12-09 DIAGNOSIS — O0990 Supervision of high risk pregnancy, unspecified, unspecified trimester: Secondary | ICD-10-CM

## 2023-12-09 DIAGNOSIS — R5383 Other fatigue: Secondary | ICD-10-CM

## 2023-12-09 DIAGNOSIS — O0992 Supervision of high risk pregnancy, unspecified, second trimester: Secondary | ICD-10-CM

## 2023-12-09 MED ORDER — BUPRENORPHINE HCL-NALOXONE HCL 8-2 MG SL FILM
1.0000 | ORAL_FILM | Freq: Four times a day (QID) | SUBLINGUAL | 0 refills | Status: DC
  Filled 2023-12-09: qty 120, 30d supply, fill #0
  Filled 2023-12-10 – 2023-12-12 (×2): qty 40, 10d supply, fill #0
  Filled 2023-12-24: qty 40, 10d supply, fill #1

## 2023-12-09 MED ORDER — ONDANSETRON HCL 4 MG PO TABS
4.0000 mg | ORAL_TABLET | Freq: Three times a day (TID) | ORAL | 5 refills | Status: DC | PRN
  Filled 2023-12-09: qty 20, 7d supply, fill #0
  Filled 2023-12-24: qty 20, 7d supply, fill #1
  Filled 2024-02-04: qty 20, 7d supply, fill #2
  Filled 2024-02-23: qty 20, 7d supply, fill #3

## 2023-12-09 NOTE — Progress Notes (Signed)
   Subjective:   Tracey Ho is a 28 y.o. [phone] here today for ongoing OB care, MOUD and substance exposed newborn management.  She reports taking her Suboxone and brought her strips with her to today's visit.  She reports increased agitation and withdrawal symptoms leading to intermittent oxycodone use  Health Maintenance Due  Topic Date Due   Pneumococcal Vaccine 53-66 Years old (1 of 2 - PCV) 06/12/2004   HPV VACCINES (1 - 3-dose series) Never done    Past Medical History:  Diagnosis Date   ADHD (attention deficit hyperactivity disorder)    Anemia    Depression    Migraine headache    PID (acute pelvic inflammatory disease) 10/22/2020    Past Surgical History:  Procedure Laterality Date   NERVE SURGERY     NO PAST SURGERIES      The following portions of the patient's history were reviewed and updated as appropriate: allergies, current medications, past family history, past medical history, past social history, past surgical history and problem list.     Objective:    Tracey Ho is quiet but appropriate in her responses with clear thinking and communication.  She appears well in no acute distress.    Assessment and Plan:  Encouraged Tracey Ho to share her withdrawal concerns with Dr. Crissie Reese today.  Reviewed need for her optimal symptom management for ideal fetal management.  We reviewed the substance exposed newborn information and she has our contact as needs arise.  Will continue to review plans for extended newborn observation and management as her labor and delivery approaches. Problem List Items Addressed This Visit       Other   Opioid use disorder   Relevant Medications   Buprenorphine HCl-Naloxone HCl (SUBOXONE) 8-2 MG FILM   Other Relevant Orders   ToxAssure Flex 15, Ur   Supervision of high risk pregnancy, antepartum - Primary   Relevant Medications   ondansetron (ZOFRAN) 4 MG tablet   Other Relevant Orders   AFP, Serum, Open Spina Bifida   Other Visit  Diagnoses       Other fatigue       Relevant Orders   TSH + free T4       Routine preventative health maintenance measures emphasized. Please refer to After Visit Summary for other counseling recommendations.   Return in about 2 weeks (around 12/23/2023) for REACH clinic, ob visit.    Total face-to-face time with patient: 20 minutes.  Over 50% of encounter was spent on counseling and coordination of care.   Hubert Azure, NNP-BC Neonatal Nurse Practitioner Substance Exposed Newborn Consult at the [facility] [phone]

## 2023-12-09 NOTE — Progress Notes (Unsigned)
 Subjective:  Tracey Ho is a 28 y.o. [phone] at [redacted]w[redacted]d being seen today for ongoing prenatal care.  She is currently monitored for the following issues for this high-risk pregnancy and has MDD (major depressive disorder), single episode, moderate (HCC); ADHD (attention deficit hyperactivity disorder), combined type; Cannabis abuse; Opioid use disorder; Unwanted fertility; Recurrent genital herpes; History of suicide attempt; History of gestational hypertension; Anxiety; Hidradenitis suppurativa; Supervision of high risk pregnancy, antepartum; History of gestational diabetes; Chlamydia trachomatis infection in mother during second trimester of pregnancy; Anemia complicating pregnancy, second trimester; Oral thrush; Genital warts; Iron deficiency anemia; Migraine without aura and without status migrainosus, not intractable; History of self mutilation; Maternal drug use complicating pregnancy, antepartum; and BMI 30.0-30.9,adult on their problem list.  Patient reports: see below.  Contractions: Not present. Vag. Bleeding: None.  Movement: Absent. Denies leaking of fluid.   The following portions of the patient's history were reviewed and updated as appropriate: allergies, current medications, past family history, past medical history, past social history, past surgical history and problem list. Problem list updated.  Objective:   Vitals:   12/09/23 1520  BP: 121/76  Pulse: 75  Weight: 199 lb (90.3 kg)    Fetal Status: Fetal Heart Rate (bpm): 196   Movement: Absent     General:  Alert, oriented and cooperative. Patient is in no acute distress.  Skin: Skin is warm and dry. No rash noted.   Cardiovascular: Normal heart rate noted  Respiratory: Normal respiratory effort, no problems with respiration noted  Abdomen: Soft, gravid, appropriate for gestational age. Pain/Pressure: Present     Pelvic: Vag. Bleeding: None Vag D/C Character: White   Cervical exam deferred        Extremities: Normal  range of motion.  Edema: Trace  Mental Status: Normal mood and affect. Normal behavior. Normal judgment and thought content.   Urinalysis:      PDMP not reviewed this encounter.***  Last UDS: Lab Results  Component Value Date   CREATIUR 183 11/26/2023     Assessment and Plan:  Pregnancy: [id number] at [redacted]w[redacted]d  1. Supervision of high risk pregnancy, antepartum (Primary) *** - AFP, Serum, Open Spina Bifida - ondansetron (ZOFRAN) 4 MG tablet; Take 1 tablet (4 mg total) by mouth every 8 (eight) hours as needed for nausea or vomiting.  Dispense: 20 tablet; Refill: 5  2. Opioid use disorder Geralda is still having a tough time with initiation of suboxone Reports she has no energy when she takes them Thinks she's still having withdrawals Taking them QID for the most part (admits to some missed doses) Endorses still using oxycodones Feels like the suboxone makes her a zombie Feels like it definitely helps with muscle aches and restlesness Has cold, chills, extreme fatigue, acheing in her bones She is wondering still about the shot, I told her I would explore community options to see if someone else carries Brixadi We also discussed that much of what she is describing could be overlap with her significant anxiety and depression, and I don't want to under treat that and over treat her OUD She agreed that seeing Asher Muir would be beneficial We also discussed checking TSH as that could be contributing Finally discussed that her significant anemia is likely also playing a role, the clinic will follow up with the infusion center to see what the delay is Refill sent for suboxone, UDS collected - ToxAssure Flex 15, Ur - Buprenorphine HCl-Naloxone HCl (SUBOXONE) 8-2 MG FILM; Place 1 Film under the tongue  every 6 (six) hours.  Dispense: 120 Film; Refill: 0  3. Other fatigue  - TSH + free T4  ***additional PL items  Preterm labor symptoms and general obstetric precautions including but not limited  to vaginal bleeding, contractions, leaking of fluid and fetal movement were reviewed in detail with the patient. Please refer to After Visit Summary for other counseling recommendations.  Return in about 2 weeks (around 12/23/2023) for REACH clinic, ob visit.   Venora Maples, MD

## 2023-12-10 ENCOUNTER — Other Ambulatory Visit (HOSPITAL_COMMUNITY): Payer: Self-pay

## 2023-12-10 ENCOUNTER — Encounter: Payer: Self-pay | Admitting: Family Medicine

## 2023-12-10 ENCOUNTER — Encounter (HOSPITAL_COMMUNITY): Payer: Self-pay

## 2023-12-11 ENCOUNTER — Encounter: Payer: Self-pay | Admitting: Family Medicine

## 2023-12-11 LAB — AFP, SERUM, OPEN SPINA BIFIDA
AFP MoM: 0.65
AFP Value: 18.4 ng/mL
Gest. Age on Collection Date: 15.4 wk
Maternal Age At EDD: 25.9 a
OSBR Risk 1 IN: 10000
Test Results:: NEGATIVE
Weight: 199 [lb_av]

## 2023-12-11 LAB — TSH+FREE T4
Free T4: 1.05 ng/dL (ref 0.82–1.77)
TSH: 1.89 u[IU]/mL (ref 0.450–4.500)

## 2023-12-12 ENCOUNTER — Other Ambulatory Visit (HOSPITAL_COMMUNITY): Payer: Self-pay

## 2023-12-12 ENCOUNTER — Encounter: Payer: Self-pay | Admitting: Family Medicine

## 2023-12-12 LAB — TOXASSURE FLEX 15, UR
6-ACETYLMORPHINE IA: NEGATIVE ng/mL
7-aminoclonazepam: NOT DETECTED ng/mg{creat}
AMPHETAMINES IA: NEGATIVE ng/mL
Alpha-hydroxyalprazolam: NOT DETECTED ng/mg{creat}
Alpha-hydroxymidazolam: NOT DETECTED ng/mg{creat}
Alpha-hydroxytriazolam: NOT DETECTED ng/mg{creat}
Alprazolam: NOT DETECTED ng/mg{creat}
BARBITURATES IA: NEGATIVE ng/mL
BUPRENORPHINE: POSITIVE
Benzodiazepines: NEGATIVE
Buprenorphine: 124 ng/mg{creat}
CANNABINOIDS IA: NEGATIVE ng/mL
COCAINE METABOLITE IA: NEGATIVE ng/mL
Clonazepam: NOT DETECTED ng/mg{creat}
Creatinine: 189 mg/dL
Desalkylflurazepam: NOT DETECTED ng/mg{creat}
Desmethyldiazepam: NOT DETECTED ng/mg{creat}
Desmethylflunitrazepam: NOT DETECTED ng/mg{creat}
Diazepam: NOT DETECTED ng/mg{creat}
ETHYL ALCOHOL Enzymatic: NEGATIVE g/dL
FENTANYL: NEGATIVE
Fentanyl: NOT DETECTED ng/mg{creat}
Flunitrazepam: NOT DETECTED ng/mg{creat}
Lorazepam: NOT DETECTED ng/mg{creat}
METHADONE IA: NEGATIVE ng/mL
METHADONE MTB IA: NEGATIVE ng/mL
Midazolam: NOT DETECTED ng/mg{creat}
Norbuprenorphine: >529 ng/mg{creat}
Norfentanyl: NOT DETECTED ng/mg{creat}
Oxazepam: NOT DETECTED ng/mg{creat}
PHENCYCLIDINE IA: NEGATIVE ng/mL
TAPENTADOL, IA: NEGATIVE ng/mL
TRAMADOL IA: NEGATIVE ng/mL
Temazepam: NOT DETECTED ng/mg{creat}

## 2023-12-12 LAB — OPIATE CLASS, MS, UR RFX
Codeine: NOT DETECTED ng/mg{creat}
Dihydrocodeine: NOT DETECTED ng/mg{creat}
Hydrocodone: NOT DETECTED ng/mg{creat}
Hydromorphone: NOT DETECTED ng/mg{creat}
Morphine: NOT DETECTED ng/mg{creat}
Norcodeine: NOT DETECTED ng/mg{creat}
Norhydrocodone: NOT DETECTED ng/mg{creat}
Normorphine: NOT DETECTED ng/mg{creat}
Opiate Class Confirmation: NEGATIVE

## 2023-12-12 LAB — OXYCODONE CLASS, MS, UR RFX
Noroxycodone: 766 ng/mg{creat}
Noroxymorphone: 340 ng/mg{creat}
Oxycodone Class Confirmation: POSITIVE
Oxycodone: 203 ng/mg{creat}
Oxymorphone: 983 ng/mg{creat}

## 2023-12-15 ENCOUNTER — Telehealth: Payer: Self-pay

## 2023-12-15 ENCOUNTER — Other Ambulatory Visit: Payer: Self-pay | Admitting: Family Medicine

## 2023-12-15 NOTE — Telephone Encounter (Signed)
 Iron infusion orders previously placed by provider. Has not been scheduled. Called In[facility] at [facility]. Their office did not receive referral. There is an issue with orders. Request will be sent to their pharmacist for assistance and then patient will be scheduled.

## 2023-12-17 ENCOUNTER — Telehealth: Payer: Self-pay | Admitting: Pharmacy Technician

## 2023-12-17 NOTE — Telephone Encounter (Signed)
 Dr. Crissie Reese,  Patient is un-insured and will need Venofer PAP (free drug). The forms will be faxed to your office for signature at fax: [phone]. Please sign forms and return.  Thanks Selena Batten

## 2023-12-19 ENCOUNTER — Encounter: Payer: Self-pay | Admitting: *Deleted

## 2023-12-19 ENCOUNTER — Telehealth: Payer: Self-pay | Admitting: *Deleted

## 2023-12-19 NOTE — Telephone Encounter (Signed)
 Received a phone call from [employer]/ Patient assistance re: Venofer application- need Dr. Crissie Reese to sign. States was faxed 2 days ago and wanted to verify we received it. I looked and was unable to find it. I informed them they can send to lab fax to me today. Nancy Fetter

## 2023-12-24 ENCOUNTER — Encounter: Payer: Self-pay | Admitting: Family Medicine

## 2023-12-24 ENCOUNTER — Other Ambulatory Visit (HOSPITAL_COMMUNITY): Payer: Self-pay

## 2023-12-30 ENCOUNTER — Ambulatory Visit: Payer: Self-pay | Admitting: Family Medicine

## 2023-12-30 ENCOUNTER — Other Ambulatory Visit: Payer: Self-pay

## 2023-12-30 ENCOUNTER — Encounter: Payer: Self-pay | Admitting: Family Medicine

## 2023-12-30 ENCOUNTER — Other Ambulatory Visit (HOSPITAL_COMMUNITY): Payer: Self-pay

## 2023-12-30 VITALS — BP 119/77 | HR 91 | Wt 200.4 lb

## 2023-12-30 DIAGNOSIS — O0990 Supervision of high risk pregnancy, unspecified, unspecified trimester: Secondary | ICD-10-CM

## 2023-12-30 DIAGNOSIS — F1190 Opioid use, unspecified, uncomplicated: Secondary | ICD-10-CM

## 2023-12-30 DIAGNOSIS — O98312 Other infections with a predominantly sexual mode of transmission complicating pregnancy, second trimester: Secondary | ICD-10-CM

## 2023-12-30 DIAGNOSIS — A6000 Herpesviral infection of urogenital system, unspecified: Secondary | ICD-10-CM

## 2023-12-30 DIAGNOSIS — Z3A18 18 weeks gestation of pregnancy: Secondary | ICD-10-CM

## 2023-12-30 DIAGNOSIS — A6004 Herpesviral vulvovaginitis: Secondary | ICD-10-CM

## 2023-12-30 DIAGNOSIS — O99012 Anemia complicating pregnancy, second trimester: Secondary | ICD-10-CM

## 2023-12-30 DIAGNOSIS — A568 Sexually transmitted chlamydial infection of other sites: Secondary | ICD-10-CM

## 2023-12-30 MED ORDER — AMOXICILLIN 500 MG PO CAPS
500.0000 mg | ORAL_CAPSULE | Freq: Three times a day (TID) | ORAL | 0 refills | Status: DC
  Filled 2023-12-30: qty 21, 7d supply, fill #0

## 2023-12-30 MED ORDER — VALACYCLOVIR HCL 1 G PO TABS
1000.0000 mg | ORAL_TABLET | Freq: Two times a day (BID) | ORAL | 3 refills | Status: DC
  Filled 2023-12-30: qty 30, 15d supply, fill #0

## 2023-12-30 MED ORDER — BUPRENORPHINE HCL-NALOXONE HCL 8-2 MG SL FILM
1.0000 | ORAL_FILM | Freq: Three times a day (TID) | SUBLINGUAL | 0 refills | Status: DC
  Filled 2023-12-30 – 2024-01-20 (×2): qty 90, 30d supply, fill #0
  Filled 2024-02-04: qty 15, 5d supply, fill #0
  Filled 2024-02-04: qty 75, 25d supply, fill #0

## 2023-12-30 MED ORDER — IBUPROFEN 800 MG PO TABS
800.0000 mg | ORAL_TABLET | Freq: Once | ORAL | Status: AC
  Administered 2023-12-30: 800 mg via ORAL

## 2023-12-30 NOTE — Progress Notes (Signed)
   PRENATAL VISIT NOTE  Subjective:  Tracey Ho is a 28 y.o. [phone] at [redacted]w[redacted]d being seen today for ongoing prenatal care.  She is currently monitored for the following issues for this high-risk pregnancy and has MDD (major depressive disorder), single episode, moderate (HCC); ADHD (attention deficit hyperactivity disorder), combined type; Cannabis abuse; Opioid use disorder; Unwanted fertility; Recurrent genital herpes; History of suicide attempt; History of gestational hypertension; Anxiety; Hidradenitis suppurativa; Supervision of high risk pregnancy, antepartum; History of gestational diabetes; Chlamydia trachomatis infection in mother during second trimester of pregnancy; Anemia complicating pregnancy, second trimester; Oral thrush; Genital warts; Iron deficiency anemia; Migraine without aura and without status migrainosus, not intractable; History of self mutilation; Maternal drug use complicating pregnancy, antepartum; and BMI 30.0-30.9,adult on their problem list.   Patient reports headache and body aches .  Contractions: Irritability. Vag. Bleeding: None.  Movement: Present. Denies leaking of fluid.   The following portions of the patient's history were reviewed and updated as appropriate: allergies, current medications, past family history, past medical history, past social history, past surgical history and problem list.   Objective:   Vitals:   12/30/23 1554  BP: 119/77  Pulse: 91  Weight: 200 lb 6.4 oz (90.9 kg)    Fetal Status: Fetal Heart Rate (bpm): 155   Movement: Present     General:  Alert, oriented and cooperative. Patient is in no acute distress.  Skin: Skin is warm and dry. No rash noted.   Cardiovascular: Normal heart rate noted  Respiratory: Normal respiratory effort, no problems with respiration noted  Abdomen: Soft, gravid, appropriate for gestational age.  Pain/Pressure: Present     Pelvic: Cervical exam deferred        Extremities: Normal range of motion.   Edema: None  Mental Status: Normal mood and affect. Normal behavior. Normal judgment and thought content.     Assessment and Plan:  Pregnancy: [id number] at [redacted]w[redacted]d 1. Supervision of high risk pregnancy, antepartum (Primary)  2. Chlamydia trachomatis infection in mother during second trimester of pregnancy - amoxicillin (AMOXIL) 500 MG capsule; Take 1 capsule (500 mg total) by mouth 3 (three) times daily for 7 days.  Dispense: 21 capsule; Refill: 0  3. Opioid use disorder - ToxAssure Flex 15, Ur - Buprenorphine HCl-Naloxone HCl (SUBOXONE) 8-2 MG FILM; Place 1 Film under the tongue 3 (three) times daily.  Dispense: 90 Film; Refill: 0  4. [redacted] weeks gestation of pregnancy  5. Recurrent genital herpes  6. Anemia complicating pregnancy, second trimester  7. Herpes simplex vulvovaginitis - valACYclovir (VALTREX) 1000 MG tablet; Take 1000 mg (1 tablet) every 12 hours for 7-10 days for first outbreak. For recurrences, take 1000 mg (1 tablet) daily for 3 days.  Dispense: 30 tablet; Refill: 3  Preterm labor symptoms and general obstetric precautions including but not limited to vaginal bleeding, contractions, leaking of fluid and fetal movement were reviewed in detail with the patient. Please refer to After Visit Summary for other counseling recommendations.   No follow-ups on file.  Future Appointments  Date Time Provider Department Center  01/08/2024  1:00 PM [facility] NURSE [facility]  01/08/2024  1:15 PM [facility] PROVIDER 1 [facility]  01/08/2024  1:30 PM [facility] US3 [facility]    Dorathy Kinsman, CNM [facility] for [employer]

## 2023-12-30 NOTE — Progress Notes (Signed)
   Subjective:   Tracey Ho is a 28 y.o. [phone] here today for ongoing substance exposed newborn [facility] consult.   Health Maintenance Due  Topic Date Due   Pneumococcal Vaccine 48-44 Years old (1 of 2 - PCV) 06/12/2004   HPV VACCINES (1 - 3-dose series) Never done    Past Medical History:  Diagnosis Date   ADHD (attention deficit hyperactivity disorder)    Anemia    Depression    Migraine headache    PID (acute pelvic inflammatory disease) 10/22/2020    Past Surgical History:  Procedure Laterality Date   NERVE SURGERY     NO PAST SURGERIES      The following portions of the patient's history were reviewed and updated as appropriate: allergies, current medications, past family history, past medical history, past social history, past surgical history and problem list.     Objective:   Tracey Ho is well appearing in no acute distress. She has linear thinking and clear communication. Her children were present with her today.    Assessment and Plan:  Met with Tracey Ho today at [facility] for ongoing substance exposed newborn consult. We discussed her ongoing prenatal care and her desire for her family member to potentially take custody of this infant.   Encouraged Tracey Ho to contact [facility] consult phone in between appointments with any further questions or concerns.     Problem List Items Addressed This Visit       Genitourinary   Recurrent genital herpes   Relevant Medications   valACYclovir (VALTREX) 1000 MG tablet     Other   Opioid use disorder   Relevant Medications   Buprenorphine HCl-Naloxone HCl (SUBOXONE) 8-2 MG FILM   Other Relevant Orders   ToxAssure Flex 15, Ur   Supervision of high risk pregnancy, antepartum - Primary   Chlamydia trachomatis infection in mother during second trimester of pregnancy   Relevant Medications   amoxicillin (AMOXIL) 500 MG capsule   valACYclovir (VALTREX) 1000 MG tablet   Anemia complicating pregnancy, second trimester    Other Visit Diagnoses       [redacted] weeks gestation of pregnancy         Herpes simplex vulvovaginitis       Relevant Medications   valACYclovir (VALTREX) 1000 MG tablet       Routine preventative health maintenance measures emphasized. Please refer to After Visit Summary for other counseling recommendations.   Return in about 4 weeks (around 01/27/2024) for ROB.    Total face-to-face time with patient: 20 minutes.  Over 50% of encounter was spent on counseling and coordination of care.   Jason Fila, NNP-BC Neonatal Nurse Practitioner Substance Exposed Newborn Consult at the [facility] [phone]

## 2024-01-02 ENCOUNTER — Encounter (HOSPITAL_COMMUNITY): Payer: Self-pay | Admitting: Obstetrics & Gynecology

## 2024-01-02 ENCOUNTER — Inpatient Hospital Stay (HOSPITAL_COMMUNITY): Payer: Self-pay

## 2024-01-02 ENCOUNTER — Inpatient Hospital Stay (HOSPITAL_COMMUNITY)
Admission: AD | Admit: 2024-01-02 | Discharge: 2024-01-07 | DRG: 831 | Disposition: A | Discharge status: Home / Self Care | Payer: Self-pay | Attending: Internal Medicine | Admitting: Internal Medicine

## 2024-01-02 DIAGNOSIS — A568 Sexually transmitted chlamydial infection of other sites: Secondary | ICD-10-CM | POA: Diagnosis present

## 2024-01-02 DIAGNOSIS — F1190 Opioid use, unspecified, uncomplicated: Secondary | ICD-10-CM | POA: Diagnosis present

## 2024-01-02 DIAGNOSIS — Z7982 Long term (current) use of aspirin: Secondary | ICD-10-CM

## 2024-01-02 DIAGNOSIS — F1210 Cannabis abuse, uncomplicated: Secondary | ICD-10-CM | POA: Diagnosis present

## 2024-01-02 DIAGNOSIS — O99332 Smoking (tobacco) complicating pregnancy, second trimester: Secondary | ICD-10-CM | POA: Diagnosis present

## 2024-01-02 DIAGNOSIS — R0600 Dyspnea, unspecified: Secondary | ICD-10-CM | POA: Diagnosis present

## 2024-01-02 DIAGNOSIS — F1120 Opioid dependence, uncomplicated: Secondary | ICD-10-CM | POA: Diagnosis present

## 2024-01-02 DIAGNOSIS — D508 Other iron deficiency anemias: Secondary | ICD-10-CM

## 2024-01-02 DIAGNOSIS — R0602 Shortness of breath: Secondary | ICD-10-CM

## 2024-01-02 DIAGNOSIS — Z3A19 19 weeks gestation of pregnancy: Secondary | ICD-10-CM

## 2024-01-02 DIAGNOSIS — J45901 Unspecified asthma with (acute) exacerbation: Secondary | ICD-10-CM

## 2024-01-02 DIAGNOSIS — A6000 Herpesviral infection of urogenital system, unspecified: Secondary | ICD-10-CM | POA: Diagnosis present

## 2024-01-02 DIAGNOSIS — O99012 Anemia complicating pregnancy, second trimester: Secondary | ICD-10-CM | POA: Diagnosis present

## 2024-01-02 DIAGNOSIS — O99282 Endocrine, nutritional and metabolic diseases complicating pregnancy, second trimester: Secondary | ICD-10-CM | POA: Diagnosis present

## 2024-01-02 DIAGNOSIS — F1290 Cannabis use, unspecified, uncomplicated: Secondary | ICD-10-CM | POA: Diagnosis present

## 2024-01-02 DIAGNOSIS — O99011 Anemia complicating pregnancy, first trimester: Secondary | ICD-10-CM

## 2024-01-02 DIAGNOSIS — B9789 Other viral agents as the cause of diseases classified elsewhere: Secondary | ICD-10-CM | POA: Diagnosis present

## 2024-01-02 DIAGNOSIS — O99019 Anemia complicating pregnancy, unspecified trimester: Secondary | ICD-10-CM | POA: Diagnosis present

## 2024-01-02 DIAGNOSIS — Z720 Tobacco use: Secondary | ICD-10-CM | POA: Diagnosis present

## 2024-01-02 DIAGNOSIS — E876 Hypokalemia: Secondary | ICD-10-CM | POA: Diagnosis present

## 2024-01-02 DIAGNOSIS — O98312 Other infections with a predominantly sexual mode of transmission complicating pregnancy, second trimester: Secondary | ICD-10-CM | POA: Diagnosis present

## 2024-01-02 DIAGNOSIS — J209 Acute bronchitis, unspecified: Secondary | ICD-10-CM | POA: Insufficient documentation

## 2024-01-02 DIAGNOSIS — O0990 Supervision of high risk pregnancy, unspecified, unspecified trimester: Principal | ICD-10-CM

## 2024-01-02 DIAGNOSIS — J4521 Mild intermittent asthma with (acute) exacerbation: Secondary | ICD-10-CM

## 2024-01-02 DIAGNOSIS — Z833 Family history of diabetes mellitus: Secondary | ICD-10-CM

## 2024-01-02 DIAGNOSIS — Z8249 Family history of ischemic heart disease and other diseases of the circulatory system: Secondary | ICD-10-CM

## 2024-01-02 DIAGNOSIS — J9601 Acute respiratory failure with hypoxia: Secondary | ICD-10-CM | POA: Insufficient documentation

## 2024-01-02 DIAGNOSIS — D509 Iron deficiency anemia, unspecified: Secondary | ICD-10-CM | POA: Diagnosis present

## 2024-01-02 DIAGNOSIS — R051 Acute cough: Secondary | ICD-10-CM

## 2024-01-02 DIAGNOSIS — Z3490 Encounter for supervision of normal pregnancy, unspecified, unspecified trimester: Secondary | ICD-10-CM

## 2024-01-02 DIAGNOSIS — J208 Acute bronchitis due to other specified organisms: Secondary | ICD-10-CM | POA: Diagnosis present

## 2024-01-02 DIAGNOSIS — O99512 Diseases of the respiratory system complicating pregnancy, second trimester: Principal | ICD-10-CM | POA: Diagnosis present

## 2024-01-02 DIAGNOSIS — O99322 Drug use complicating pregnancy, second trimester: Secondary | ICD-10-CM | POA: Diagnosis present

## 2024-01-02 DIAGNOSIS — F17210 Nicotine dependence, cigarettes, uncomplicated: Secondary | ICD-10-CM | POA: Insufficient documentation

## 2024-01-02 HISTORY — DX: Shortness of breath: R06.02

## 2024-01-02 LAB — COMPREHENSIVE METABOLIC PANEL WITH GFR
ALT: 8 U/L (ref 0–44)
AST: 12 U/L — ABNORMAL LOW (ref 15–41)
Albumin: 2.7 g/dL — ABNORMAL LOW (ref 3.5–5.0)
Alkaline Phosphatase: 61 U/L (ref 38–126)
Anion gap: 11 (ref 5–15)
BUN: 5 mg/dL — ABNORMAL LOW (ref 6–20)
CO2: 19 mmol/L — ABNORMAL LOW (ref 22–32)
Calcium: 8.4 mg/dL — ABNORMAL LOW (ref 8.9–10.3)
Chloride: 105 mmol/L (ref 98–111)
Creatinine, Ser: 0.64 mg/dL (ref 0.44–1.00)
GFR, Estimated: >60 mL/min (ref >60)
Glucose, Bld: 102 mg/dL — ABNORMAL HIGH (ref 70–99)
Potassium: 3.2 mmol/L — ABNORMAL LOW (ref 3.5–5.1)
Sodium: 135 mmol/L (ref 135–145)
Total Bilirubin: 1.2 mg/dL (ref 0.0–1.2)
Total Protein: 6.7 g/dL (ref 6.5–8.1)

## 2024-01-02 LAB — CBC WITH DIFFERENTIAL/PLATELET
Abs Immature Granulocytes: 0.02 10*3/uL (ref 0.00–0.07)
Basophils Absolute: 0 10*3/uL (ref 0.0–0.1)
Basophils Relative: 0 %
Eosinophils Absolute: 0.1 10*3/uL (ref 0.0–0.5)
Eosinophils Relative: 2 %
HCT: 26.8 % — ABNORMAL LOW (ref 36.0–46.0)
Hemoglobin: 7.9 g/dL — ABNORMAL LOW (ref 12.0–15.0)
Immature Granulocytes: 1 %
Lymphocytes Relative: 15 %
Lymphs Abs: 0.7 10*3/uL (ref 0.7–4.0)
MCH: 22.5 pg — ABNORMAL LOW (ref 26.0–34.0)
MCHC: 29.5 g/dL — ABNORMAL LOW (ref 30.0–36.0)
MCV: 76.4 fL — ABNORMAL LOW (ref 80.0–100.0)
Monocytes Absolute: 0.4 10*3/uL (ref 0.1–1.0)
Monocytes Relative: 9 %
Neutro Abs: 3.2 10*3/uL (ref 1.7–7.7)
Neutrophils Relative %: 73 %
Platelets: 195 10*3/uL (ref 150–400)
RBC: 3.51 MIL/uL — ABNORMAL LOW (ref 3.87–5.11)
RDW: 16.4 % — ABNORMAL HIGH (ref 11.5–15.5)
WBC: 4.3 10*3/uL (ref 4.0–10.5)
nRBC: 0 % (ref 0.0–0.2)

## 2024-01-02 LAB — RESPIRATORY PANEL BY PCR

## 2024-01-02 LAB — D-DIMER, QUANTITATIVE: D-Dimer, Quant: 0.34 ug{FEU}/mL (ref 0.00–0.50)

## 2024-01-02 LAB — FERRITIN: Ferritin: 6 ng/mL — ABNORMAL LOW (ref 11–307)

## 2024-01-02 LAB — FIBRINOGEN: Fibrinogen: 525 mg/dL — ABNORMAL HIGH (ref 210–475)

## 2024-01-02 LAB — C-REACTIVE PROTEIN: CRP: 9.3 mg/dL — ABNORMAL HIGH (ref <1.0)

## 2024-01-02 LAB — TROPONIN I (HIGH SENSITIVITY): Troponin I (High Sensitivity): <2 ng/L (ref <18)

## 2024-01-02 LAB — BRAIN NATRIURETIC PEPTIDE: B Natriuretic Peptide: 7.4 pg/mL (ref 0.0–100.0)

## 2024-01-02 MED ORDER — IPRATROPIUM-ALBUTEROL 0.5-2.5 (3) MG/3ML IN SOLN
3.0000 mL | Freq: Once | RESPIRATORY_TRACT | Status: AC
Start: 2024-01-02 — End: 2024-01-02
  Administered 2024-01-02: 3 mL via RESPIRATORY_TRACT
  Filled 2024-01-02: qty 3

## 2024-01-02 MED ORDER — SODIUM CHLORIDE 0.9 % IV SOLN
500.0000 mg | INTRAVENOUS | Status: DC
  Administered 2024-01-03: 500 mg via INTRAVENOUS
  Filled 2024-01-02: qty 5

## 2024-01-02 MED ORDER — ONDANSETRON HCL 4 MG PO TABS
4.0000 mg | ORAL_TABLET | Freq: Four times a day (QID) | ORAL | Status: DC | PRN
  Administered 2024-01-03: 4 mg via ORAL
  Filled 2024-01-02: qty 1

## 2024-01-02 MED ORDER — BUPRENORPHINE HCL-NALOXONE HCL 8-2 MG SL SUBL
1.0000 | SUBLINGUAL_TABLET | Freq: Three times a day (TID) | SUBLINGUAL | Status: DC
  Administered 2024-01-04 – 2024-01-06 (×7): 1 via SUBLINGUAL
  Filled 2024-01-02 (×9): qty 1

## 2024-01-02 MED ORDER — SODIUM CHLORIDE 0.9 % IV SOLN
250.0000 mL | INTRAVENOUS | Status: AC | PRN
  Administered 2024-01-03: 250 mL via INTRAVENOUS

## 2024-01-02 MED ORDER — ONDANSETRON HCL 4 MG/2ML IJ SOLN
4.0000 mg | Freq: Four times a day (QID) | INTRAMUSCULAR | Status: DC | PRN
  Administered 2024-01-05 – 2024-01-06 (×2): 4 mg via INTRAVENOUS
  Filled 2024-01-02 (×2): qty 2

## 2024-01-02 MED ORDER — SODIUM CHLORIDE 0.9% FLUSH
3.0000 mL | Freq: Two times a day (BID) | INTRAVENOUS | Status: DC
  Administered 2024-01-03 – 2024-01-06 (×8): 3 mL via INTRAVENOUS

## 2024-01-02 MED ORDER — IPRATROPIUM-ALBUTEROL 0.5-2.5 (3) MG/3ML IN SOLN: 3.0000 mL | RESPIRATORY_TRACT | Status: DC | PRN

## 2024-01-02 MED ORDER — SODIUM CHLORIDE 0.9% FLUSH: 3.0000 mL | INTRAVENOUS | Status: DC | PRN

## 2024-01-02 MED ORDER — ACETAMINOPHEN 325 MG PO TABS
650.0000 mg | ORAL_TABLET | Freq: Four times a day (QID) | ORAL | Status: DC | PRN
  Administered 2024-01-03: 650 mg via ORAL
  Filled 2024-01-02 (×2): qty 2

## 2024-01-02 MED ORDER — IPRATROPIUM-ALBUTEROL 0.5-2.5 (3) MG/3ML IN SOLN
3.0000 mL | Freq: Four times a day (QID) | RESPIRATORY_TRACT | Status: DC
  Administered 2024-01-03 – 2024-01-06 (×14): 3 mL via RESPIRATORY_TRACT
  Filled 2024-01-02 (×22): qty 3

## 2024-01-02 MED ORDER — METHYLPREDNISOLONE SODIUM SUCC 125 MG IJ SOLR
40.0000 mg | Freq: Every day | INTRAMUSCULAR | Status: DC
  Administered 2024-01-03 – 2024-01-05 (×3): 40 mg via INTRAVENOUS
  Filled 2024-01-02 (×4): qty 2

## 2024-01-02 MED ORDER — PRENATAL MULTIVITAMIN CH
1.0000 | ORAL_TABLET | Freq: Every day | ORAL | Status: DC
  Administered 2024-01-03 – 2024-01-06 (×4): 1 via ORAL
  Filled 2024-01-02 (×4): qty 1

## 2024-01-02 MED ORDER — ENOXAPARIN SODIUM 40 MG/0.4ML IJ SOSY
40.0000 mg | PREFILLED_SYRINGE | INTRAMUSCULAR | Status: DC
  Filled 2024-01-02 (×2): qty 0.4

## 2024-01-02 MED ORDER — ACETAMINOPHEN 650 MG RE SUPP: 650.0000 mg | Freq: Four times a day (QID) | RECTAL | Status: DC | PRN

## 2024-01-02 MED ORDER — POLYETHYLENE GLYCOL 3350 17 G PO PACK
17.0000 g | PACK | Freq: Every day | ORAL | Status: DC
  Administered 2024-01-03 – 2024-01-07 (×5): 17 g via ORAL
  Filled 2024-01-02 (×5): qty 1

## 2024-01-02 MED ORDER — BUPRENORPHINE HCL-NALOXONE HCL 8-2 MG SL SUBL: 1.0000 | SUBLINGUAL_TABLET | Freq: Every day | SUBLINGUAL | Status: DC

## 2024-01-02 MED ORDER — METHYLPREDNISOLONE SODIUM SUCC 125 MG IJ SOLR
125.0000 mg | Freq: Once | INTRAMUSCULAR | Status: AC
  Administered 2024-01-03: 125 mg via INTRAVENOUS
  Filled 2024-01-02: qty 2

## 2024-01-02 NOTE — H&P (Addendum)
 History and Physical    Tracey Ho ZOX:[id number] DOB: [birth year] DOA: 01/02/2024  PCP: Pcp, No   Patient coming from: Home   Chief Complaint:  Chief Complaint  Patient presents with   Shortness of Breath   Cough     HPI:  Tracey Ho is a 28 y.o. female G6, P4 and currently 16-week pregnant history of opioid use disorder, HSV, anemia of pregnancy and cigarette smoking presented to OB/GYN and received call from OB/GYN for evaluation for shortness of breath and cough for 1 week as well as mid substernal chest pain for 1 day. Patient is complaining about productive cough with associated shortness of breath and wheezing that has been ongoing for 1 day.  Reported history of smoking half pack cigarettes currently.  Denies any fever and chills.  Also complaining about mid substernal nonradiating chest pain which aggravates with deep breathing and cough.  No previous history of heart attack and CHF.  No history of peripartum cardiomyopathy.  Patient denies any IV drug use and recreational drug use.  CBC showing hemoglobin 9.9 and hematocrit 26.  Low MCV 76.  WBC count 4.9 and normal platelet count. D-dimer within normal range. Elevated fibrinogen 525. - Pending CMP, troponin, BNP, C-reactive protein, and respiratory panel. -Chest x-ray no active disease process.  EKG showing normal sinus rhythm heart rate 90.  Patient has been treated with DuoNeb and reported improvement of shortness of breath.  Hospitalist has been consulted for admission to comanage with OB/GYN for cough and shortness of breath.  Significant labs in the ED: Lab Orders         Respiratory (~20 pathogens) panel by PCR         D-dimer, quantitative         C-reactive protein         Fibrinogen         Ferritin         Procalcitonin         CBC with Differential/Platelet         Brain natriuretic peptide         Comprehensive metabolic panel         HIV Antibody (routine testing w rflx)         CBC          Comprehensive metabolic panel       Review of Systems:  Review of Systems  Constitutional:  Negative for chills, fever, malaise/fatigue and weight loss.  HENT:  Negative for congestion and sinus pain.   Respiratory:  Positive for cough, sputum production, shortness of breath and wheezing. Negative for stridor.   Cardiovascular:  Positive for chest pain. Negative for palpitations, orthopnea, claudication, leg swelling and PND.  Gastrointestinal:  Negative for abdominal pain, heartburn, nausea and vomiting.  Genitourinary:  Negative for dysuria, frequency and urgency.  Musculoskeletal:  Negative for back pain, falls, joint pain, myalgias and neck pain.  Skin:  Negative for rash.  Neurological:  Negative for dizziness.  Endo/Heme/Allergies:  Negative for environmental allergies. Does not bruise/bleed easily.  Psychiatric/Behavioral:  The patient is not nervous/anxious.     Past Medical History:  Diagnosis Date   ADHD (attention deficit hyperactivity disorder)    Anemia    Depression    Migraine headache    PID (acute pelvic inflammatory disease) 10/22/2020    Past Surgical History:  Procedure Laterality Date   NERVE SURGERY     NO PAST SURGERIES       reports that she  has been smoking cigarettes. She has never used smokeless tobacco. She reports that she does not currently use alcohol. She reports current drug use. Drugs: Marijuana and Oxycodone.  No Known Allergies  Family History  Problem Relation Age of Onset   Hypertension Mother    Thyroid disease Mother    Healthy Father    Diabetes Maternal Aunt    Stroke Maternal Aunt    Hypertension Maternal Aunt    Stroke Maternal Grandmother    Hypertension Maternal Grandmother    Asthma Neg Hx    Cancer Neg Hx    Birth defects Neg Hx    Heart disease Neg Hx     Prior to Admission medications   Medication Sig Start Date End Date Taking? Authorizing Provider  amoxicillin (AMOXIL) 500 MG capsule Take 1 capsule (500 mg  total) by mouth 3 (three) times daily for 7 days. 12/30/23 01/06/24  Teena Feast, MD  aspirin EC 81 MG tablet Take 2 tablets (162 mg total) by mouth daily. Swallow whole. 11/25/23   Teena Feast, MD  Buprenorphine HCl-Naloxone HCl (SUBOXONE) 8-2 MG FILM Place 1 Film under the tongue 3 (three) times daily. 12/30/23 01/29/24  Smith, [place] , CNM  naloxone [facility]) nasal spray 4 mg/0.1 mL Use as needed to reverse opioid overdose Patient not taking: Reported on 02/18/2023 11/12/22   Teena Feast, MD  ondansetron (ZOFRAN) 4 MG tablet Take 1 tablet (4 mg total) by mouth every 8 (eight) hours as needed for nausea or vomiting. 12/09/23   Teena Feast, MD  ondansetron (ZOFRAN-ODT) 4 MG disintegrating tablet Dissolve 1 tablet (4 mg total) in mouth every 6 (six) hours as needed. Patient not taking: Reported on 12/30/2023 11/25/23   Teena Feast, MD  polyethylene glycol powder [facility]) 17 GM/SCOOP powder Mix 17 grams in beverage and take by mouth daily. 11/25/23   Teena Feast, MD  Prenatal Vit-Fe Fumarate-FA (MULTIVITAMIN-PRENATAL) 27-0.8 MG TABS tablet Take 1 tablet by mouth daily at 12 noon.    [provider]  valACYclovir (VALTREX) 1000 MG tablet Take 1000 mg (1 tablet) every 12 hours for 7-10 days for first outbreak. For recurrences, take 1000 mg (1 tablet) daily for 3 days. 12/30/23   Smith, [place] , CNM     Physical Exam: Vitals:   01/02/24 2230 01/02/24 2235 01/02/24 2236 01/02/24 2245  BP:      Pulse:      Resp:      Temp:      TempSrc:      SpO2: 100% 95% 95% 100%  Weight:      Height:        Physical Exam Vitals and nursing note reviewed.  Constitutional:      Appearance: She is not ill-appearing.  Cardiovascular:     Rate and Rhythm: Normal rate and regular rhythm.  Pulmonary:     Effort: Pulmonary effort is normal. No accessory muscle usage or respiratory distress.     Breath sounds: No stridor. Examination of the right-upper field reveals  wheezing. Examination of the left-upper field reveals wheezing. Examination of the right-middle field reveals wheezing. Examination of the left-middle field reveals wheezing. Wheezing present. No decreased breath sounds, rhonchi or rales.  Musculoskeletal:     Cervical back: Neck supple.     Right lower leg: Edema present.     Left lower leg: Edema present.  Skin:    General: Skin is dry.     Capillary Refill: Capillary refill takes less than  2 seconds.  Neurological:     Mental Status: She is alert and oriented to person, place, and time.  Psychiatric:        Mood and Affect: Mood normal. Mood is not anxious.        Behavior: Behavior normal.      Labs on Admission: I have personally reviewed following labs and imaging studies  CBC: Recent Labs  Lab 01/02/24 2107  WBC 4.3  NEUTROABS 3.2  HGB 7.9*  HCT 26.8*  MCV 76.4*  PLT 195   Basic Metabolic Panel: No results for input(s): "NA", "K", "CL", "CO2", "GLUCOSE", "BUN", "CREATININE", "CALCIUM", "MG", "PHOS" in the last 168 hours. GFR: CrCl cannot be calculated (Patient's most recent lab result is older than the maximum 21 days allowed.). Liver Function Tests: No results for input(s): "AST", "ALT", "ALKPHOS", "BILITOT", "PROT", "ALBUMIN" in the last 168 hours. No results for input(s): "LIPASE", "AMYLASE" in the last 168 hours. No results for input(s): "AMMONIA" in the last 168 hours. Coagulation Profile: No results for input(s): "INR", "PROTIME" in the last 168 hours. Cardiac Enzymes: No results for input(s): "CKTOTAL", "CKMB", "CKMBINDEX", "TROPONINI", "TROPONINIHS" in the last 168 hours. BNP (last 3 results) No results for input(s): "BNP" in the last 8760 hours. HbA1C: No results for input(s): "HGBA1C" in the last 72 hours. CBG: No results for input(s): "GLUCAP" in the last 168 hours. Lipid Profile: No results for input(s): "CHOL", "HDL", "LDLCALC", "TRIG", "CHOLHDL", "LDLDIRECT" in the last 72 hours. Thyroid Function  Tests: No results for input(s): "TSH", "T4TOTAL", "FREET4", "T3FREE", "THYROIDAB" in the last 72 hours. Anemia Panel: No results for input(s): "VITAMINB12", "FOLATE", "FERRITIN", "TIBC", "IRON", "RETICCTPCT" in the last 72 hours. Urine analysis:    Component Value Date/Time   COLORURINE YELLOW 11/12/2023 1005   APPEARANCEUR HAZY (A) 11/12/2023 1005   LABSPEC 1.017 11/12/2023 1005   PHURINE 5.0 11/12/2023 1005   GLUCOSEU NEGATIVE 11/12/2023 1005   HGBUR NEGATIVE 11/12/2023 1005   BILIRUBINUR NEGATIVE 11/12/2023 1005   KETONESUR 5 (A) 11/12/2023 1005   PROTEINUR NEGATIVE 11/12/2023 1005   UROBILINOGEN 0.2 02/18/2023 1337   NITRITE NEGATIVE 11/12/2023 1005   LEUKOCYTESUR MODERATE (A) 11/12/2023 1005    Radiological Exams on Admission: I have personally reviewed images DG CHEST PORT 1 VIEW Result Date: 01/02/2024 CLINICAL DATA:  Cough, wheezing EXAM: PORTABLE CHEST 1 VIEW COMPARISON:  None Available. FINDINGS: The heart size and mediastinal contours are within normal limits. Both lungs are clear. The visualized skeletal structures are unremarkable. IMPRESSION: No active disease. Electronically Signed   By: Worthy Heads M.D.   On: 01/02/2024 21:12     EKG: My personal interpretation of EKG shows: Normal sinus rhythm    Assessment/Plan: Principal Problem:   Reactive airway disease with acute exacerbation Active Problems:   Shortness of breath   Cannabis abuse   Opioid use disorder   Intrauterine pregnancy   Anemia of pregnancy   Marijuana smoker   Dyspnea   Acute hypoxic respiratory failure (HCC)   SOB (shortness of breath)   Continuous dependence on cigarette smoking    Assessment and Plan: Reactive airway disease exacerbation Dyspnea, cough and pleuritic chest pain Acute hypoxic respiratory failure History of chronic smoking cigarette and marijuana - Patient presenting with complaining of cough and shortness of breath for 1 week.  Patient is complaining mid  substernal chest pain and pressure with deep breathing for 1 day.  O2 sat dropped to 86% room air and improved to 95 to 100% on 2 L  facemask oxygen. - Patient denies any fever and chill.  Denies any recent sick contact.  She is complaining about productive cough.  Continues to smoke half pack cigarettes a day. - Showing normal sinus rhythm and there is no ST-T wave abnormality.  Pending troponin and BNP level.  Pending respiratory panel. -Chest x-ray no evidence of pneumonia - Normal D-dimer level. - Physical exam showing bilateral upper and lower lung field wheezing. - Dyspnea, productive cough and cold chest pain concern for underlying reactive airway disease exacerbation in the setting of chronic smoking cigarette - Continue DuoNeb every 6 hour, azithromycin 500 mg for 5 days, giving Solu-Medrol 125 mg one-time dose followed by 40 mg for 4 days.  Continue DuoNeb as needed for wheezing shortness of breath.  Continue supplemental oxygen to keep O2 sat above 96%. Addendum - Respiratory panel positive for rhinovirus and enterovirus. - Reactive airway disease elevation in the setting of rhino enterovirus infection and underlying smoking.    Noncardiac chest pain in the setting of reactive airways exacerbation -Patient is complaining about mid substernal chest pain which she elects with cough and deep breathing.  Reports chest pain for last 24-hour.  Chest pain is provoking by cough and deep breathing.  No relieving factor.  Denies any associated nausea and diaphoresis.  No radiation of the chest pain. -Noncardiac chest pain/pleuritic pain in the setting of reactive airway disease and rhinovirus infection. -EKG showing normal sinus rhythm heart rate 90.  There is no ST anterior abnormality.  Normal QTc. - Pending troponin, BNP level. - Obtaining echocardiogram to assess baseline heart function. -Continue cardiac monitoring.  Iron deficiency anemia Anemia secondary to pregnancy - Hemoglobin 7.9,  low hematocrit 26.8 and a low MCV 76.  Low ferritin 6.  Checking iron and TIBC.  Checking folate vitamin B12 level - Discussed with OB/GYN Dr. Dorla Gartner, it is safe to give IV Venofer or Feraheme infusion. - Utilized OB iron infusion order set.  Infusing Feraheme 510 mg once. - Continue prenatal multivitamin.   First trimester pregnancy -History of G6, P4.  Currently 19-weeks intrauterine pregnancy. Consulted OB/GYN to make comanagement for pregnancy.  Chronic opioid dependence -History of chronic opioid dependence - Continue Suboxone buccal patch 3 times daily   DVT prophylaxis:  Lovenox Code Status:  Full Code Diet: Regular diet Family Communication: No family member at bedside. Disposition Plan: Continue to monitor improvement of shortness of breath and cough.  Pending multiple labs and echocardiogram. Consults: OB/GYN Admission status:   Inpatient, Telemetry bed  Severity of Illness: The appropriate patient status for this patient is INPATIENT. Inpatient status is judged to be reasonable and necessary in order to provide the required intensity of service to ensure the patient's safety. The patient's presenting symptoms, physical exam findings, and initial radiographic and laboratory data in the context of their chronic comorbidities is felt to place them at high risk for further clinical deterioration. Furthermore, it is not anticipated that the patient will be medically stable for discharge from the hospital within 2 midnights of admission.   * I certify that at the point of admission it is my clinical judgment that the patient will require inpatient hospital care spanning beyond 2 midnights from the point of admission due to high intensity of service, high risk for further deterioration and high frequency of surveillance required.Aaron Aas    Yandriel Boening, MD [facility]  How to contact the [facility] Attending or Consulting provider 7A - 7P or covering provider during after hours 7P  -  7A, for this patient.  Check the care team in [facility] and look for a) attending/consulting [facility] provider listed and b) the [facility] team listed Log into [email or web address] and use [place]'s universal password to access. If you do not have the password, please contact the hospital operator. Locate the [facility] provider you are looking for under [facility] and page to a number that you can be directly reached. If you still have difficulty reaching the provider, please page the [facility] (Director on Call) for the Hospitalists listed on amion for assistance.  01/02/2024, 11:06 PM

## 2024-01-02 NOTE — MAU Provider Note (Addendum)
 Chief Complaint:  Shortness of Breath and Cough   HPI   Tracey Ho is a 28 y.o. [id number] at 110w0d who presents to maternity admissions reporting cough and shortness of breath. Around a week ago she started to develop some nasal congestion and attributed this to allergies. Yesterday morning she woke up with a bad cough with shortness of breath which has continued to today. Her cough has only produced minimal "green" sputum which she notes may have blood tinged once.  She denies LOF, VB, and N/V.  Pregnancy Course:   Past Medical History:  Diagnosis Date   ADHD (attention deficit hyperactivity disorder)    Anemia    Depression    Migraine headache    PID (acute pelvic inflammatory disease) 10/22/2020   OB History  Gravida Para Term Preterm AB Living  6 3 3  0 2 3  SAB IAB Ectopic Multiple Live Births  2 0 0 0 3    # Outcome Date GA Lbr Len/2nd Weight Sex Type Anes PTL Lv  6 Current           5 Term 08/09/22 [redacted]w[redacted]d   M Vag-Spont   LIV  4 Term 2017     Vag-Spont   LIV  3 Term 2016     Vag-Spont   LIV     Birth Comments: System Generated. Please review and update pregnancy details.  2 SAB 2014          1 SAB 2014           Past Surgical History:  Procedure Laterality Date   NERVE SURGERY     NO PAST SURGERIES     Family History  Problem Relation Age of Onset   Hypertension Mother    Thyroid disease Mother    Healthy Father    Diabetes Maternal Aunt    Stroke Maternal Aunt    Hypertension Maternal Aunt    Stroke Maternal Grandmother    Hypertension Maternal Grandmother    Asthma Neg Hx    Cancer Neg Hx    Birth defects Neg Hx    Heart disease Neg Hx    Social History   Tobacco Use   Smoking status: Every Day    Current packs/day: 0.50    Types: Cigarettes   Smokeless tobacco: Never  Vaping Use   Vaping status: Former   Substances: Nicotine, Flavoring  Substance Use Topics   Alcohol use: Not Currently   Drug use: Yes    Types: Marijuana, Oxycodone    Comment:  current oxycodone use; last marijuana use 5 + years ago   No Known Allergies Medications Prior to Admission  Medication Sig Dispense Refill Last Dose/Taking   amoxicillin (AMOXIL) 500 MG capsule Take 1 capsule (500 mg total) by mouth 3 (three) times daily for 7 days. 21 capsule 0    aspirin EC 81 MG tablet Take 2 tablets (162 mg total) by mouth daily. Swallow whole. 60 tablet 12    Buprenorphine HCl-Naloxone HCl (SUBOXONE) 8-2 MG FILM Place 1 Film under the tongue 3 (three) times daily. 90 Film 0    naloxone (NARCAN) nasal spray 4 mg/0.1 mL Use as needed to reverse opioid overdose (Patient not taking: Reported on 02/18/2023) 2 each 11    ondansetron (ZOFRAN) 4 MG tablet Take 1 tablet (4 mg total) by mouth every 8 (eight) hours as needed for nausea or vomiting. 20 tablet 5    ondansetron (ZOFRAN-ODT) 4 MG disintegrating tablet Dissolve 1 tablet (4 mg  total) in mouth every 6 (six) hours as needed. (Patient not taking: Reported on 12/30/2023) 20 tablet 5    polyethylene glycol powder (GLYCOLAX/MIRALAX) 17 GM/SCOOP powder Mix 17 grams in beverage and take by mouth daily. 476 g 2    Prenatal Vit-Fe Fumarate-FA (MULTIVITAMIN-PRENATAL) 27-0.8 MG TABS tablet Take 1 tablet by mouth daily at 12 noon.      valACYclovir (VALTREX) 1000 MG tablet Take 1000 mg (1 tablet) every 12 hours for 7-10 days for first outbreak. For recurrences, take 1000 mg (1 tablet) daily for 3 days. 30 tablet 3     I have reviewed patient's Past Medical Hx, Surgical Hx, Family Hx, Social Hx, medications and allergies.   ROS  Pertinent items noted in HPI and remainder of comprehensive ROS otherwise negative.   PHYSICAL EXAM  Patient Vitals for the past 24 hrs:  BP Temp Temp src Pulse Resp SpO2 Height Weight  01/02/24 2115 -- -- -- -- -- 98 % -- --  01/02/24 2110 -- -- -- -- -- 98 % -- --  01/02/24 2105 -- -- -- -- -- 100 % -- --  01/02/24 2100 -- -- -- -- -- 100 % -- --  01/02/24 2058 -- -- -- -- -- 99 % -- --  01/02/24 2050 --  -- -- -- -- 92 % -- --  01/02/24 2045 -- -- -- -- -- (!) 86 % -- --  01/02/24 2040 -- -- -- -- -- 97 % -- --  01/02/24 2035 -- -- -- -- -- 93 % -- --  01/02/24 2030 -- -- -- -- -- 92 % -- --  01/02/24 2025 -- -- -- -- -- 94 % -- --  01/02/24 2020 -- -- -- -- -- 93 % -- --  01/02/24 1926 117/68 98.8 F (37.1 C) Oral 91 20 95 % 5\' 6"  (1.676 m) 89.4 kg    Constitutional: Well-developed, well-nourished female in no acute distress.  Cardiovascular: normal rate & rhythm, warm and well-perfused Respiratory: Increased effort, bilateral diffuse expiratory wheezing GI: Abd soft, non-tender, gravid MS: Extremities nontender, no edema, normal ROM Neurologic: Alert and oriented x 4.  GU: no CVA tenderness  Labs: Results for orders placed or performed during the hospital encounter of 01/02/24 (from the past 24 hours)  CBC with Differential/Platelet     Status: Abnormal   Collection Time: 01/02/24  9:07 PM  Result Value Ref Range   WBC 4.3 4.0 - 10.5 K/uL   RBC 3.51 (L) 3.87 - 5.11 MIL/uL   Hemoglobin 7.9 (L) 12.0 - 15.0 g/dL   HCT 16.1 (L) 09.6 - 04.5 %   MCV 76.4 (L) 80.0 - 100.0 fL   MCH 22.5 (L) 26.0 - 34.0 pg   MCHC 29.5 (L) 30.0 - 36.0 g/dL   RDW 40.9 (H) 81.1 - 91.4 %   Platelets 195 150 - 400 K/uL   nRBC 0.0 0.0 - 0.2 %   Neutrophils Relative % 73 %   Neutro Abs 3.2 1.7 - 7.7 K/uL   Lymphocytes Relative 15 %   Lymphs Abs 0.7 0.7 - 4.0 K/uL   Monocytes Relative 9 %   Monocytes Absolute 0.4 0.1 - 1.0 K/uL   Eosinophils Relative 2 %   Eosinophils Absolute 0.1 0.0 - 0.5 K/uL   Basophils Relative 0 %   Basophils Absolute 0.0 0.0 - 0.1 K/uL   Immature Granulocytes 1 %   Abs Immature Granulocytes 0.02 0.00 - 0.07 K/uL    Imaging:  DG CHEST  PORT 1 VIEW Result Date: 01/02/2024 CLINICAL DATA:  Cough, wheezing EXAM: PORTABLE CHEST 1 VIEW COMPARISON:  None Available. FINDINGS: The heart size and mediastinal contours are within normal limits. Both lungs are clear. The visualized  skeletal structures are unremarkable. IMPRESSION: No active disease. Electronically Signed   By: Helyn Numbers M.D.   On: 01/02/2024 21:12    MDM & MAU COURSE  MDM: 1) Shortness of breath, Acute cough  Jeslyn is having some labored breathing and some significant coughing episodes. She has diffuse expiratory wheezing in bilateral lungs throughout all fields. X-ray had clear lung fields bilaterally though possible peribronchial cuffing was noted in the left upper lobe by MAU team suspicious for early stages of pulmonary edema or bronchitis. She is currently afebrile.  After laying down following her duoneb treatment, her O2 Saturation has intermittently dropped to the mid 80s so she has been placed on 2L [place] which has brought her up closer to 100%.   2) Supervision of high risk pregnancy, antepartum,[redacted] weeks gestation of pregnancy Continue routine prenatal care  MAU Course: Orders Placed This Encounter  Procedures   Respiratory (~20 pathogens) panel by PCR   DG CHEST PORT 1 VIEW   D-dimer, quantitative   C-reactive protein   Fibrinogen   Ferritin   Procalcitonin   CBC with Differential/Platelet   Brain natriuretic peptide   Comprehensive metabolic panel   Droplet precaution   EKG 12-Lead   Meds ordered this encounter  Medications   ipratropium-albuterol (DUONEB) 0.5-2.5 (3) MG/3ML nebulizer solution 3 mL    I have reviewed the patient chart and performed the physical exam . I have ordered & interpreted the lab results.  Medications ordered as stated below.  A/P as described below.  Counseling and education provided and patient agreeable  with plan as described below. Verbalized understanding.    ASSESSMENT/PLAN   1. Supervision of high risk pregnancy, antepartum   2. Shortness of breath   3. Acute cough   4. [redacted] weeks gestation of pregnancy    1) Shortness of breath, Acute cough  Chest x-ray was negative. Duoneb administered to patient which did not improve her breathing  effort and coughing. Her O2 sat was improved with 2L [place] O2. Consult with Hospitalist team to advise management.   Plan to admit.  2) Supervision of high risk pregnancy, antepartum,[redacted] weeks gestation of pregnancy Continue routine prenatal care  Jinger Neighbors Medical Student  Attestation of Supervision of Student:  I confirm that I have verified the information documented in the medical student's note and that I have also personally reperformed the history, physical exam and all medical decision making activities.  I have verified that all services and findings are accurately documented in this student's note; and I agree with management and plan as outlined in the documentation. I have also made any necessary editorial changes.  - Bilateral expiratory wheezing noted. With course lung sounds. Concern for flash pulmonary edema given drastic drop in O2 with laying down.  - CBC noted severe anemia and patient will likely need blood upon admission.  - EKG Normal Sinus Rhythm  - Consult to Dr. Janalyn Shy, MD Plan to admit to inpatient once labs result.  - Dr. Janalyn Shy down to assess patient. Per MD admit to [facility] and they will be the primary team in her care. Concern for COPD.  - Dr. Vergie Living OBGYN notified and to place basic admit orders.    Claudette Head, CNM [facility] [employer] 01/02/2024 9:45 PM

## 2024-01-02 NOTE — MAU Note (Signed)
 Pt says cough started last Friday- OTC meds -  Feels chest pain- started last night . Non- productive cough. No fever.  No hx Asthma.  No one else sick

## 2024-01-02 NOTE — Telephone Encounter (Signed)
 Patient has been approved for free drug.  Patient will be scheduled as soon as possible.  Auth Submission: APPROVED free drug Site of care: Site of care: [facility] Payer: free drug Medication & CPT/J Code(s) submitted: Venofer (Iron Sucrose) J1756 Route of submission (phone, fax, portal):  Phone # Fax # Auth type:  Units/visits requested:  Reference number:  Approval from:  to

## 2024-01-03 ENCOUNTER — Inpatient Hospital Stay (HOSPITAL_COMMUNITY): Payer: Self-pay

## 2024-01-03 ENCOUNTER — Other Ambulatory Visit: Payer: Self-pay

## 2024-01-03 DIAGNOSIS — J206 Acute bronchitis due to rhinovirus: Secondary | ICD-10-CM

## 2024-01-03 DIAGNOSIS — R0609 Other forms of dyspnea: Secondary | ICD-10-CM

## 2024-01-03 DIAGNOSIS — Z720 Tobacco use: Secondary | ICD-10-CM | POA: Diagnosis present

## 2024-01-03 LAB — COMPREHENSIVE METABOLIC PANEL WITH GFR
ALT: 8 U/L (ref 0–44)
AST: 11 U/L — ABNORMAL LOW (ref 15–41)
Albumin: 2.6 g/dL — ABNORMAL LOW (ref 3.5–5.0)
Alkaline Phosphatase: 60 U/L (ref 38–126)
Anion gap: 9 (ref 5–15)
BUN: 5 mg/dL — ABNORMAL LOW (ref 6–20)
CO2: 19 mmol/L — ABNORMAL LOW (ref 22–32)
Calcium: 8.3 mg/dL — ABNORMAL LOW (ref 8.9–10.3)
Chloride: 107 mmol/L (ref 98–111)
Creatinine, Ser: 0.59 mg/dL (ref 0.44–1.00)
GFR, Estimated: >60 mL/min (ref >60)
Glucose, Bld: 121 mg/dL — ABNORMAL HIGH (ref 70–99)
Potassium: 4.1 mmol/L (ref 3.5–5.1)
Sodium: 135 mmol/L (ref 135–145)
Total Bilirubin: 1.2 mg/dL (ref 0.0–1.2)
Total Protein: 6.5 g/dL (ref 6.5–8.1)

## 2024-01-03 LAB — IRON AND TIBC
Iron: 41 ug/dL (ref 28–170)
Saturation Ratios: 11 % (ref 10.4–31.8)
TIBC: 382 ug/dL (ref 250–450)
UIBC: 341 ug/dL

## 2024-01-03 LAB — FOLATE: Folate: 12.2 ng/mL (ref >5.9)

## 2024-01-03 LAB — CBC
HCT: 25.8 % — ABNORMAL LOW (ref 36.0–46.0)
Hemoglobin: 7.7 g/dL — ABNORMAL LOW (ref 12.0–15.0)
MCH: 22.6 pg — ABNORMAL LOW (ref 26.0–34.0)
MCHC: 29.8 g/dL — ABNORMAL LOW (ref 30.0–36.0)
MCV: 75.9 fL — ABNORMAL LOW (ref 80.0–100.0)
Platelets: 231 10*3/uL (ref 150–400)
RBC: 3.4 MIL/uL — ABNORMAL LOW (ref 3.87–5.11)
RDW: 16.7 % — ABNORMAL HIGH (ref 11.5–15.5)
WBC: 5.2 10*3/uL (ref 4.0–10.5)
nRBC: 0 % (ref 0.0–0.2)

## 2024-01-03 LAB — ECHOCARDIOGRAM COMPLETE
Height: 66 in
S' Lateral: 2.8 cm
Weight: 3155.2 [oz_av]

## 2024-01-03 LAB — RETICULOCYTES
Immature Retic Fract: 24.9 % — ABNORMAL HIGH (ref 2.3–15.9)
RBC.: 3.52 MIL/uL — ABNORMAL LOW (ref 3.87–5.11)
Retic Count, Absolute: 69.7 10*3/uL (ref 19.0–186.0)
Retic Ct Pct: 2 % (ref 0.4–3.1)

## 2024-01-03 LAB — PROCALCITONIN: Procalcitonin: <0.1 ng/mL

## 2024-01-03 LAB — HIV ANTIBODY (ROUTINE TESTING W REFLEX): HIV Screen 4th Generation wRfx: NONREACTIVE

## 2024-01-03 LAB — VITAMIN B12: Vitamin B-12: 429 pg/mL (ref 180–914)

## 2024-01-03 MED ORDER — EPINEPHRINE 0.3 MG/0.3ML IJ SOAJ
0.3000 mg | Freq: Once | INTRAMUSCULAR | Status: DC | PRN
  Filled 2024-01-03: qty 0.3

## 2024-01-03 MED ORDER — BUTALBITAL-APAP-CAFFEINE 50-325-40 MG PO TABS
2.0000 | ORAL_TABLET | Freq: Three times a day (TID) | ORAL | Status: DC | PRN
Start: 2024-01-03 — End: 2024-01-07
  Administered 2024-01-05: 2 via ORAL
  Filled 2024-01-03: qty 2

## 2024-01-03 MED ORDER — METHYLPREDNISOLONE SODIUM SUCC 125 MG IJ SOLR: 125.0000 mg | Freq: Once | INTRAMUSCULAR | Status: DC | PRN

## 2024-01-03 MED ORDER — ALBUTEROL SULFATE (2.5 MG/3ML) 0.083% IN NEBU
2.5000 mg | INHALATION_SOLUTION | Freq: Once | RESPIRATORY_TRACT | Status: DC | PRN
  Filled 2024-01-03: qty 3

## 2024-01-03 MED ORDER — NICOTINE 14 MG/24HR TD PT24
14.0000 mg | MEDICATED_PATCH | Freq: Every day | TRANSDERMAL | Status: DC
  Administered 2024-01-03 – 2024-01-06 (×4): 14 mg via TRANSDERMAL
  Filled 2024-01-03 (×6): qty 1

## 2024-01-03 MED ORDER — SODIUM CHLORIDE 0.9 % IV SOLN: 250.0000 mg | Freq: Once | INTRAVENOUS | Status: DC

## 2024-01-03 MED ORDER — SODIUM CHLORIDE 0.9 % IV SOLN: INTRAVENOUS | Status: AC | PRN

## 2024-01-03 MED ORDER — MAGNESIUM GLUCONATE 500 (27 MG) MG PO TABS
500.0000 mg | ORAL_TABLET | ORAL | Status: AC
  Administered 2024-01-03: 500 mg via ORAL
  Filled 2024-01-03: qty 1

## 2024-01-03 MED ORDER — GUAIFENESIN-DM 100-10 MG/5ML PO SYRP
5.0000 mL | ORAL_SOLUTION | Freq: Four times a day (QID) | ORAL | Status: DC | PRN
  Administered 2024-01-03: 5 mL via ORAL
  Filled 2024-01-03 (×2): qty 5

## 2024-01-03 MED ORDER — SODIUM CHLORIDE 0.9 % IV SOLN
510.0000 mg | Freq: Once | INTRAVENOUS | Status: AC
  Administered 2024-01-03: 510 mg via INTRAVENOUS
  Filled 2024-01-03 (×2): qty 17

## 2024-01-03 MED ORDER — SODIUM CHLORIDE 0.9 % IV BOLUS: 500.0000 mL | Freq: Once | INTRAVENOUS | Status: DC | PRN

## 2024-01-03 MED ORDER — IBUPROFEN 800 MG PO TABS
400.0000 mg | ORAL_TABLET | Freq: Four times a day (QID) | ORAL | Status: DC | PRN
  Administered 2024-01-03: 400 mg via ORAL
  Filled 2024-01-03 (×2): qty 1

## 2024-01-03 MED ORDER — SODIUM CHLORIDE 0.9 % IV SOLN: 510.0000 mg | Freq: Once | INTRAVENOUS | Status: DC

## 2024-01-03 MED ORDER — VALACYCLOVIR HCL 500 MG PO TABS
1000.0000 mg | ORAL_TABLET | Freq: Two times a day (BID) | ORAL | Status: DC
  Administered 2024-01-03 – 2024-01-07 (×8): 1000 mg via ORAL
  Filled 2024-01-03 (×8): qty 2

## 2024-01-03 MED ORDER — BUTALBITAL-APAP-CAFFEINE 50-325-40 MG PO TABS
2.0000 | ORAL_TABLET | Freq: Four times a day (QID) | ORAL | Status: AC | PRN
  Administered 2024-01-03: 2 via ORAL
  Filled 2024-01-03: qty 2

## 2024-01-03 MED ORDER — DIPHENHYDRAMINE HCL 50 MG/ML IJ SOLN: 25.0000 mg | Freq: Once | INTRAMUSCULAR | Status: DC | PRN

## 2024-01-03 NOTE — Progress Notes (Signed)
 Echocardiogram 2D Echocardiogram has been performed.  Tracey Ho Legend Pecore RDCS 01/03/2024, 10:04 AM

## 2024-01-03 NOTE — Progress Notes (Signed)
 RN Bridgette Campus Rashawna Scoles started infusion of feraheme, at 117mL per hour- after about 3-4 minutes, patient was sweaty, felt like she was burning and had severe stomach pains and headache. RN stopped infusion, took vitals, assessed patient for continued symptoms- after about 10 minutes, patient felt better, except for a lingering headache. RN notified main pharmach, [facility] pharmacy was notified and called RN, with guidance on OB iron infusion hypersensitivity protocol. After discussion with Dr. Aquilla Knapp, RN will administer 1 gm tylenol and try infusion again at half rate.

## 2024-01-03 NOTE — Progress Notes (Addendum)
 Iron deficiency anemia Anemia secondary to pregnancy - Hemoglobin 7.9, low hematocrit 26.8 and a low MCV 76.  Low ferritin 6.  Checking iron and TIBC.  Checking folate vitamin B12 level - Discussed with OB/GYN Dr. Dorla Gartner, it is safe to give IV Venofer or Feraheme infusion. - Utilized OB iron infusion order set.  Infusing Feraheme 510 mg once. - Continue prenatal multivitamin.

## 2024-01-03 NOTE — Progress Notes (Signed)
 Educated patient regarding repeated removal of [place] by patient. Reiterated the need for continuous [place] for oxygenation purposes. Patient currently on 3L humidified O2 via  saturation at 93%. MD made aware as documented in flowsheet.

## 2024-01-03 NOTE — Progress Notes (Addendum)
 Progress Note   Patient: Tracey Ho WGN:[id number] DOB: [birth year] DOA: 01/02/2024     1 DOS: the patient was seen and examined on 01/03/2024   Brief hospital course: 28 year old woman with PMH of SUD, HSV, anemia, [id number] currently [redacted] weeks pregnant who was admitted with cough, shortness of breath.  Patient was found to be hypoxic, with positive RVP for rhino/enterovirus and is being treated for likely bronchitis.  Assessment and Plan:  Acute bronchitis Patient presenting with wheezing, shortness of breath in the setting of rhinovirus infection.  No prior history of asthma or similar episodes. Chest x-ray unremarkable. - Continue steroids, DuoNebs, guaifenesin. - Azithromycin discontinued  Rhino/enterovirus infection RVP was positive for rhino/enterovirus. -Continue symptomatic management.  Acute hypoxic respiratory failure Due to bronchitis. Patient requiring 3 L/min supplemental oxygen. No baseline oxygen use. - Continue supplemental oxygen and wean as tolerated. - Follow-up TTE  Iron deficiency anemia Presented with hemoglobin 7.9, low MCV. Ferritin: 6 Per OB/GYN, safe to give IV iron. Patient is s/p transfusion of Feraheme x2 on 4/12. - Per patient, she has a scheduled iron infusion later this month.  First trimester pregnancy [id number], currently [redacted] weeks pregnant. OB/GYN following. - Continue care per OB/GYN  Substance use disorder History of chronic opioid dependence as well as tobacco use. - Continue Suboxone. - Continue nicotine patch.      Subjective: Patient still complains of shortness of breath.  She states she does not have a history of asthma and has never had a similar episode in the past.  Physical Exam: Vitals:   01/03/24 0825 01/03/24 1030 01/03/24 1159 01/03/24 1210  BP:   124/71   Pulse:   93   Resp:   19   Temp:   97.6 F (36.4 C)   TempSrc:   Oral   SpO2: 98% 93% 96% 95%  Weight:      Height:         General: Alert, oriented X3   Eyes: Pupils equal, reactive  Oral cavity: moist mucous membranes  Head: Atraumatic, normocephalic  Neck: supple  Chest: Wheezing + CVS: S1,S2 RRR. No murmurs  Abd: No distention, soft, non-tender. No masses palpable  Extr: No edema   MSK: No joint deformities or swelling  Neurological: Grossly intact.    Data Reviewed:     Latest Ref Rng & Units 01/03/2024    7:51 AM 01/02/2024    9:07 PM 11/25/2023    4:13 PM  CBC  WBC 4.0 - 10.5 K/uL 5.2  4.3  4.6   Hemoglobin 12.0 - 15.0 g/dL 7.7  7.9  8.5   Hematocrit 36.0 - 46.0 % 25.8  26.8  29.4   Platelets 150 - 400 K/uL 231  195  273       Latest Ref Rng & Units 01/03/2024    7:51 AM 01/02/2024    9:07 PM 11/25/2023    4:13 PM  BMP  Glucose 70 - 99 mg/dL 952  841  82   BUN 6 - 20 mg/dL 5  5  6    Creatinine 0.44 - 1.00 mg/dL 3.24  4.01  0.27   BUN/Creat Ratio 9 - 23   11   Sodium 135 - 145 mmol/L 135  135  135   Potassium 3.5 - 5.1 mmol/L 4.1  3.2  3.9   Chloride 98 - 111 mmol/L 107  105  102   CO2 22 - 32 mmol/L 19  19  20    Calcium 8.9 - 10.3 mg/dL  8.3  8.4  9.1     Family Communication: n/a  Disposition: Status is: Inpatient Remains inpatient appropriate because: Ongoing need for: Supplemental O2, need for nebulizer treatment  Planned Discharge Destination: Home    Time spent: 35 minutes  Author: MDALA-GAUSI, Conswella Bruney AGATHA, MD 01/03/2024 2:38 PM  For on call review [email or web address].

## 2024-01-03 NOTE — Plan of Care (Signed)

## 2024-01-03 NOTE — H&P (Signed)
 Expand All Collapse All[] Expand All by Default  Chief Complaint:  Shortness of Breath and Cough     HPI    Tracey Ho is a 28 y.o. [id number] at [redacted]w[redacted]d who presents to maternity admissions reporting cough and shortness of breath. Around a week ago she started to develop some nasal congestion and attributed this to allergies. Yesterday morning she woke up with a bad cough with shortness of breath which has continued to today. Her cough has only produced minimal "green" sputum which she notes may have blood tinged once.  She denies LOF, VB, and N/V.   Pregnancy Course:        Past Medical History:  Diagnosis Date   ADHD (attention deficit hyperactivity disorder)     Anemia     Depression     Migraine headache     PID (acute pelvic inflammatory disease) 10/22/2020                         OB History  Gravida Para Term Preterm AB Living   6 3 3  0 2 3   SAB IAB Ectopic Multiple Live Births      2 0 0 0 3         # Outcome Date GA Lbr Len/2nd Weight Sex Type Anes PTL Lv  6 Current                    5 Term 08/09/22 [redacted]w[redacted]d     M Vag-Spont     LIV  4 Term 2017         Vag-Spont     LIV  3 Term 2016         Vag-Spont     LIV     Birth Comments: System Generated. Please review and update pregnancy details.  2 SAB 2014                  1 SAB 2014                         Past Surgical History:  Procedure Laterality Date   NERVE SURGERY       NO PAST SURGERIES                 Family History  Problem Relation Age of Onset   Hypertension Mother     Thyroid disease Mother     Healthy Father     Diabetes Maternal Aunt     Stroke Maternal Aunt     Hypertension Maternal Aunt     Stroke Maternal Grandmother     Hypertension Maternal Grandmother     Asthma Neg Hx     Cancer Neg Hx     Birth defects Neg Hx     Heart disease Neg Hx          Social History  Social History         Tobacco Use   Smoking status: Every Day      Current packs/day: 0.50      Types: Cigarettes    Smokeless tobacco: Never  Vaping Use   Vaping status: Former   Substances: Nicotine, Flavoring  Substance Use Topics   Alcohol use: Not Currently   Drug use: Yes      Types: Marijuana, Oxycodone      Comment: current oxycodone use; last marijuana use 5 + years ago      Allergies  No Known Allergies          Medications Prior to Admission  Medication Sig Dispense Refill Last Dose/Taking   amoxicillin (AMOXIL) 500 MG capsule Take 1 capsule (500 mg total) by mouth 3 (three) times daily for 7 days. 21 capsule 0     aspirin EC 81 MG tablet Take 2 tablets (162 mg total) by mouth daily. Swallow whole. 60 tablet 12     Buprenorphine HCl-Naloxone HCl (SUBOXONE) 8-2 MG FILM Place 1 Film under the tongue 3 (three) times daily. 90 Film 0     naloxone (NARCAN) nasal spray 4 mg/0.1 mL Use as needed to reverse opioid overdose (Patient not taking: Reported on 02/18/2023) 2 each 11     ondansetron (ZOFRAN) 4 MG tablet Take 1 tablet (4 mg total) by mouth every 8 (eight) hours as needed for nausea or vomiting. 20 tablet 5     ondansetron (ZOFRAN-ODT) 4 MG disintegrating tablet Dissolve 1 tablet (4 mg total) in mouth every 6 (six) hours as needed. (Patient not taking: Reported on 12/30/2023) 20 tablet 5     polyethylene glycol powder (GLYCOLAX/MIRALAX) 17 GM/SCOOP powder Mix 17 grams in beverage and take by mouth daily. 476 g 2     Prenatal Vit-Fe Fumarate-FA (MULTIVITAMIN-PRENATAL) 27-0.8 MG TABS tablet Take 1 tablet by mouth daily at 12 noon.         valACYclovir (VALTREX) 1000 MG tablet Take 1000 mg (1 tablet) every 12 hours for 7-10 days for first outbreak. For recurrences, take 1000 mg (1 tablet) daily for 3 days. 30 tablet 3            I have reviewed patient's Past Medical Hx, Surgical Hx, Family Hx, Social Hx, medications and allergies.    ROS  Pertinent items noted in HPI and remainder of comprehensive ROS otherwise negative.    PHYSICAL EXAM  Patient Vitals for the past 24 hrs:   BP Temp Temp  src Pulse Resp SpO2 Height Weight  01/02/24 2115 -- -- -- -- -- 98 % -- --  01/02/24 2110 -- -- -- -- -- 98 % -- --  01/02/24 2105 -- -- -- -- -- 100 % -- --  01/02/24 2100 -- -- -- -- -- 100 % -- --  01/02/24 2058 -- -- -- -- -- 99 % -- --  01/02/24 2050 -- -- -- -- -- 92 % -- --  01/02/24 2045 -- -- -- -- -- (!) 86 % -- --  01/02/24 2040 -- -- -- -- -- 97 % -- --  01/02/24 2035 -- -- -- -- -- 93 % -- --  01/02/24 2030 -- -- -- -- -- 92 % -- --  01/02/24 2025 -- -- -- -- -- 94 % -- --  01/02/24 2020 -- -- -- -- -- 93 % -- --  01/02/24 1926 117/68 98.8 F (37.1 C) Oral 91 20 95 % 5\' 6"  (1.676 m) 89.4 kg      Constitutional: Well-developed, well-nourished female in no acute distress.  Cardiovascular: normal rate & rhythm, warm and well-perfused Respiratory: Increased effort, bilateral diffuse expiratory wheezing GI: Abd soft, non-tender, gravid MS: Extremities nontender, no edema, normal ROM Neurologic: Alert and oriented x 4.  GU: no CVA tenderness   Labs: Lab Results Last 24 Hours       Results for orders placed or performed during the hospital encounter of 01/02/24 (from the past 24 hours)  CBC with Differential/Platelet     Status: Abnormal  Collection Time: 01/02/24  9:07 PM  Result Value Ref Range    WBC 4.3 4.0 - 10.5 K/uL    RBC 3.51 (L) 3.87 - 5.11 MIL/uL    Hemoglobin 7.9 (L) 12.0 - 15.0 g/dL    HCT 40.9 (L) 81.1 - 46.0 %    MCV 76.4 (L) 80.0 - 100.0 fL    MCH 22.5 (L) 26.0 - 34.0 pg    MCHC 29.5 (L) 30.0 - 36.0 g/dL    RDW 91.4 (H) 78.2 - 15.5 %    Platelets 195 150 - 400 K/uL    nRBC 0.0 0.0 - 0.2 %    Neutrophils Relative % 73 %    Neutro Abs 3.2 1.7 - 7.7 K/uL    Lymphocytes Relative 15 %    Lymphs Abs 0.7 0.7 - 4.0 K/uL    Monocytes Relative 9 %    Monocytes Absolute 0.4 0.1 - 1.0 K/uL    Eosinophils Relative 2 %    Eosinophils Absolute 0.1 0.0 - 0.5 K/uL    Basophils Relative 0 %    Basophils Absolute 0.0 0.0 - 0.1 K/uL    Immature Granulocytes 1 %     Abs Immature Granulocytes 0.02 0.00 - 0.07 K/uL        Imaging:  DG CHEST PORT 1 VIEW Result Date: 01/02/2024 CLINICAL DATA:  Cough, wheezing EXAM: PORTABLE CHEST 1 VIEW COMPARISON:  None Available. FINDINGS: The heart size and mediastinal contours are within normal limits. Both lungs are clear. The visualized skeletal structures are unremarkable. IMPRESSION: No active disease. Electronically Signed   By: Worthy Heads M.D.   On: 01/02/2024 21:12      MDM & MAU COURSE  MDM: 1) Shortness of breath, Acute cough  [place] is having some labored breathing and some significant coughing episodes. She has diffuse expiratory wheezing in bilateral lungs throughout all fields. X-ray had clear lung fields bilaterally though possible peribronchial cuffing was noted in the left upper lobe by MAU team suspicious for early stages of pulmonary edema or bronchitis. She is currently afebrile.  After laying down following her duoneb treatment, her O2 Saturation has intermittently dropped to the mid 80s so she has been placed on 2L [place] which has brought her up closer to 100%.    2) Supervision of high risk pregnancy, antepartum,[redacted] weeks gestation of pregnancy Continue routine prenatal care   MAU Course:    Orders Placed This Encounter  Procedures   Respiratory (~20 pathogens) panel by PCR   DG CHEST PORT 1 VIEW   D-dimer, quantitative   C-reactive protein   Fibrinogen   Ferritin   Procalcitonin   CBC with Differential/Platelet   Brain natriuretic peptide   Comprehensive metabolic panel   Droplet precaution   EKG 12-Lead       Meds ordered this encounter  Medications   ipratropium-albuterol (DUONEB) 0.5-2.5 (3) MG/3ML nebulizer solution 3 mL      I have reviewed the patient chart and performed the physical exam . I have ordered & interpreted the lab results.   Medications ordered as stated below.  A/P as described below.  Counseling and education provided and patient agreeable  with plan as  described below. Verbalized understanding.     ASSESSMENT/PLAN    1. Supervision of high risk pregnancy, antepartum   2. Shortness of breath   3. Acute cough   4. [redacted] weeks gestation of pregnancy     1) Shortness of breath, Acute cough  Chest x-ray  was negative. Duoneb administered to patient which did not improve her breathing effort and coughing. Her O2 sat was improved with 2L [place] O2. Consult with Hospitalist team to advise management.    Plan to admit.   2) Supervision of high risk pregnancy, antepartum,[redacted] weeks gestation of pregnancy Continue routine prenatal care   Laron Plummer Medical Student   Attestation of Supervision of Student:  I confirm that I have verified the information documented in the medical student's note and that I have also personally reperformed the history, physical exam and all medical decision making activities.  I have verified that all services and findings are accurately documented in this student's note; and I agree with management and plan as outlined in the documentation. I have also made any necessary editorial changes.   - Bilateral expiratory wheezing noted. With course lung sounds. Concern for flash pulmonary edema given drastic drop in O2 with laying down.  - CBC noted severe anemia and patient will likely need blood upon admission.  - EKG Normal Sinus Rhythm  - Consult to Dr. Sundil, MD Plan to admit to inpatient once labs result.  - Dr. Sundil down to assess patient. Per MD admit to [facility] and they will be the primary team in her care. Concern for COPD.  - Dr. Aquilla Knapp OBGYN notified and to place basic admit orders.      Corie Diamond, CNM Center for [employer], [facility] 01/02/2024 9:45 PM    Attestation of Attending Supervision of Certified Nurse Midwife: Evaluation and management procedures were performed by the CNM under my supervision.  I have seen and examined the patient,  reviewed the CNM's note and chart, and I  agree with the management and plan.    *Admit to antepartum/OB service with Hospitalists management *Pregnancy: qday FHTs.  *Resp: appreciate hospitalist recs. F/u admit labs, echo in AM *Anemia: okay for IV iron *OUD: continue home suboxone *PPx: lovenox, bedrest with bathroom privileges   Tyler Gallant MD Attending Center for [facility] (Faculty Practice) GYN Consult Phone: [phone] (M-F, [phone]) & [phone] (Off hours, weekends, holidays)

## 2024-01-04 DIAGNOSIS — Z3A19 19 weeks gestation of pregnancy: Secondary | ICD-10-CM

## 2024-01-04 DIAGNOSIS — J209 Acute bronchitis, unspecified: Secondary | ICD-10-CM | POA: Insufficient documentation

## 2024-01-04 DIAGNOSIS — O99891 Other specified diseases and conditions complicating pregnancy: Secondary | ICD-10-CM

## 2024-01-04 MED ORDER — FLUTICASONE PROPIONATE 50 MCG/ACT NA SUSP
2.0000 | Freq: Every day | NASAL | Status: DC
  Administered 2024-01-04 – 2024-01-07 (×4): 2 via NASAL
  Filled 2024-01-04: qty 16

## 2024-01-04 MED ORDER — AZITHROMYCIN 250 MG PO TABS
1000.0000 mg | ORAL_TABLET | Freq: Once | ORAL | Status: DC
  Filled 2024-01-04: qty 4

## 2024-01-04 MED ORDER — AMOXICILLIN 500 MG PO CAPS
500.0000 mg | ORAL_CAPSULE | Freq: Three times a day (TID) | ORAL | Status: DC
Start: 2024-01-04 — End: 2024-01-11
  Administered 2024-01-04 – 2024-01-07 (×8): 500 mg via ORAL
  Filled 2024-01-04 (×8): qty 1

## 2024-01-04 MED ORDER — GUAIFENESIN ER 600 MG PO TB12
600.0000 mg | ORAL_TABLET | Freq: Two times a day (BID) | ORAL | Status: DC | PRN
  Administered 2024-01-04 – 2024-01-05 (×3): 600 mg via ORAL
  Filled 2024-01-04 (×5): qty 1

## 2024-01-04 NOTE — Progress Notes (Signed)
 Progress Note   Patient: Tracey Ho ZOX:[id number] DOB: [birth year] DOA: 01/02/2024     2 DOS: the patient was seen and examined on 01/04/2024   Brief hospital course: 28 year old woman with PMH of SUD, HSV, anemia, [id number] currently [redacted] weeks pregnant who was admitted with cough, shortness of breath.  Patient was found to be hypoxic, with positive RVP for rhino/enterovirus and is being treated for likely bronchitis.  Assessment and Plan:  Acute bronchitis Patient presenting with wheezing, shortness of breath in the setting of rhinovirus infection.  No prior history of asthma or similar episodes. Chest x-ray unremarkable. - Continue steroids, DuoNebs, guaifenesin. - Azithromycin discontinued  Rhino/enterovirus infection RVP was positive for rhino/enterovirus. -Continue symptomatic management.  Acute hypoxic respiratory failure Due to bronchitis. Patient requiring 3 L/min supplemental oxygen. No baseline oxygen use. TTE shows EF 70 to 75% with hyperdynamic left and right ventricular function.  Expected in pregnancy. - Continue supplemental oxygen and wean as tolerated.  Iron deficiency anemia Presented with hemoglobin 7.9, low MCV. Ferritin: 6 Per OB/GYN, safe to give IV iron. Patient is s/p transfusion of Feraheme x2 on 4/12. - Per patient, she has a scheduled iron infusion later this month.  First trimester pregnancy [id number], currently [redacted] weeks pregnant. OB/GYN following. - Continue care per OB/GYN  Substance use disorder History of chronic opioid dependence as well as tobacco use. - Continue Suboxone. - Continue nicotine patch.      Subjective: Patient still complains of shortness of breath. Complains of fatigue today.   Physical Exam: Vitals:   01/04/24 0600 01/04/24 0842 01/04/24 0930 01/04/24 1143  BP:  (!) 104/55  119/65  Pulse:  84  90  Resp:  18 17 18   Temp:  98 F (36.7 C)  97.9 F (36.6 C)  TempSrc:  Oral  Oral  SpO2: 98% 99%  95%  Weight:       Height:         General: Alert, oriented X3  Eyes: Pupils equal, reactive  Oral cavity: moist mucous membranes  Head: Atraumatic, normocephalic  Neck: supple  Chest: Few rhonchi CVS: S1,S2 RRR. No murmurs  Abd: No distention, soft, non-tender. No masses palpable  Extr: No edema   MSK: No joint deformities or swelling  Neurological: Grossly intact.    Data Reviewed:     Latest Ref Rng & Units 01/03/2024    7:51 AM 01/02/2024    9:07 PM 11/25/2023    4:13 PM  CBC  WBC 4.0 - 10.5 K/uL 5.2  4.3  4.6   Hemoglobin 12.0 - 15.0 g/dL 7.7  7.9  8.5   Hematocrit 36.0 - 46.0 % 25.8  26.8  29.4   Platelets 150 - 400 K/uL 231  195  273       Latest Ref Rng & Units 01/03/2024    7:51 AM 01/02/2024    9:07 PM 11/25/2023    4:13 PM  BMP  Glucose 70 - 99 mg/dL 829  562  82   BUN 6 - 20 mg/dL 5  5  6    Creatinine 0.44 - 1.00 mg/dL 1.30  8.65  7.84   BUN/Creat Ratio 9 - 23   11   Sodium 135 - 145 mmol/L 135  135  135   Potassium 3.5 - 5.1 mmol/L 4.1  3.2  3.9   Chloride 98 - 111 mmol/L 107  105  102   CO2 22 - 32 mmol/L 19  19  20    Calcium 8.9 -  10.3 mg/dL 8.3  8.4  9.1     Family Communication: n/a  Disposition: Status is: Inpatient Remains inpatient appropriate because: Ongoing need for: Supplemental O2, need for nebulizer treatment  Planned Discharge Destination: Home  DVT ppx: Lovenox     Time spent: 35 minutes  Author: MDALA-GAUSI, Tambra Muller AGATHA, MD 01/04/2024 1:08 PM  For on call review [email or web address].

## 2024-01-04 NOTE — Progress Notes (Signed)
 Patient ID: Tracey Ho, female   DOB: 11/13/1995, 28 y.o.   MRN: [id number] FACULTY PRACTICE ANTEPARTUM(COMPREHENSIVE) NOTE  Tracey Ho is a 28 y.o. [id number] at [redacted]w[redacted]d by early ultrasound who is admitted for respiratory infection with hypoxia.   Fetal presentation is unsure. Length of Stay:  2  Days  Subjective: Still wheezing and SOB on RA Patient reports the fetal movement as active. Patient reports uterine contraction  activity as none. Patient reports  vaginal bleeding as none. Patient describes fluid per vagina as None.  Vitals:  Blood pressure (!) 104/55, pulse 84, temperature 98 F (36.7 C), temperature source Oral, resp. rate 18, height 5\' 6"  (1.676 m), weight 89.4 kg, last menstrual period 08/19/2023, SpO2 99%, not currently breastfeeding. Physical Examination:  General appearance - alert, well appearing, and in no distress Heart - normal rate and regular rhythm Abdomen - soft, nontender, nondistended  . Extremities: extremities normal, atraumatic, no cyanosis or edema and Homans sign is negative, no sign of DVT with Membranes:intact  Fetal Monitoring:  150 BPM  Labs:  Results for orders placed or performed during the hospital encounter of 01/02/24 (from the past 24 hours)  ECHOCARDIOGRAM COMPLETE   Collection Time: 01/03/24 10:04 AM  Result Value Ref Range   Weight 3,155.2 oz   Height 66 in   BP 108/68 mmHg   S' Lateral 2.80 cm   Est EF 70 - 75%      Medications:  Scheduled  buprenorphine-naloxone  1 tablet Sublingual TID   enoxaparin (LOVENOX) injection  40 mg Subcutaneous Q24H   ipratropium-albuterol  3 mL Nebulization Q6H   methylPREDNISolone (SOLU-MEDROL) injection  40 mg Intravenous Daily   nicotine  14 mg Transdermal Daily   polyethylene glycol  17 g Oral Daily   prenatal multivitamin  1 tablet Oral Q1200   sodium chloride flush  3 mL Intravenous Q12H   valACYclovir  1,000 mg Oral BID   I have reviewed the patient's current  medications.  ASSESSMENT: Patient Active Problem List   Diagnosis Date Noted   Acute bronchitis 01/04/2024   Tobacco abuse 01/03/2024   Shortness of breath 01/02/2024   Marijuana smoker 01/02/2024   Dyspnea 01/02/2024   Acute hypoxic respiratory failure (HCC) 01/02/2024   SOB (shortness of breath) 01/02/2024   Continuous dependence on cigarette smoking 01/02/2024   Reactive airway disease with acute exacerbation 01/02/2024   Iron deficiency anemia 12/09/2023   Maternal drug use complicating pregnancy, antepartum 12/09/2023   Oral thrush 12/04/2023   Chlamydia trachomatis infection in mother during second trimester of pregnancy 12/01/2023   Anemia of pregnancy 12/01/2023   Intrauterine pregnancy 11/18/2023   Hidradenitis suppurativa 03/25/2023   Anxiety 02/18/2023   History of gestational hypertension 10/17/2022   Unwanted fertility 05/02/2022   Opioid use disorder 03/27/2022   History of gestational diabetes 01/11/2022   Genital warts 04/16/2021   Recurrent genital herpes 10/22/2020   Migraine without aura and without status migrainosus, not intractable 10/28/2017   BMI 30.0-30.9,adult 11/20/2015   History of suicide attempt 10/09/2012   History of self mutilation 10/09/2012   MDD (major depressive disorder), single episode, moderate (HCC) 08/04/2012   ADHD (attention deficit hyperactivity disorder), combined type 08/04/2012   Cannabis abuse 08/04/2012    PLAN: IM following patient still requires supplemental O2  Onnie Bilis 01/04/2024,9:58 AM

## 2024-01-04 NOTE — Progress Notes (Signed)
 CSW acknowledges consult placed due to social determinants of health screening (utilities) and attempted to meet with patient to complete psychosocial assessment and assess for resource needs; however, when CSW entered room, patient was observed doing breathing treatment. Patient requested CSW return a later time, stating it is difficult for her to breathe and she is tired. CSW to follow up.   Signed,  Elizabeth Gulling, MSW, LCSWA, LCASA 01/04/2024 11:22 AM

## 2024-01-04 NOTE — Plan of Care (Signed)

## 2024-01-04 NOTE — Progress Notes (Signed)
 CSW made second attempt to meet with patient. When CSW entered room, patient reported continued difficulty breathing and requested a return visit tomorrow, 01/05/24. Weekday CSW to follow up.   Signed,  Elizabeth Gulling, MSW, LCSWA, LCASA 01/04/2024 5:25 PM

## 2024-01-05 DIAGNOSIS — O0992 Supervision of high risk pregnancy, unspecified, second trimester: Secondary | ICD-10-CM

## 2024-01-05 DIAGNOSIS — R0902 Hypoxemia: Secondary | ICD-10-CM

## 2024-01-05 DIAGNOSIS — F1110 Opioid abuse, uncomplicated: Secondary | ICD-10-CM

## 2024-01-05 LAB — CBC
HCT: 22.4 % — ABNORMAL LOW (ref 36.0–46.0)
HCT: 27.3 % — ABNORMAL LOW (ref 36.0–46.0)
Hemoglobin: 6.7 g/dL — CL (ref 12.0–15.0)
Hemoglobin: 8.3 g/dL — ABNORMAL LOW (ref 12.0–15.0)
MCH: 23.2 pg — ABNORMAL LOW (ref 26.0–34.0)
MCH: 24 pg — ABNORMAL LOW (ref 26.0–34.0)
MCHC: 29.9 g/dL — ABNORMAL LOW (ref 30.0–36.0)
MCHC: 30.4 g/dL (ref 30.0–36.0)
MCV: 77.5 fL — ABNORMAL LOW (ref 80.0–100.0)
MCV: 78.9 fL — ABNORMAL LOW (ref 80.0–100.0)
Platelets: 221 10*3/uL (ref 150–400)
Platelets: 248 10*3/uL (ref 150–400)
RBC: 2.89 MIL/uL — ABNORMAL LOW (ref 3.87–5.11)
RBC: 3.46 MIL/uL — ABNORMAL LOW (ref 3.87–5.11)
RDW: 17 % — ABNORMAL HIGH (ref 11.5–15.5)
RDW: 16.8 % — ABNORMAL HIGH (ref 11.5–15.5)
WBC: 5.6 10*3/uL (ref 4.0–10.5)
WBC: 8.1 10*3/uL (ref 4.0–10.5)
nRBC: 0.5 % — ABNORMAL HIGH (ref 0.0–0.2)
nRBC: 0.6 % — ABNORMAL HIGH (ref 0.0–0.2)

## 2024-01-05 LAB — BASIC METABOLIC PANEL WITH GFR
Anion gap: 6 (ref 5–15)
BUN: 5 mg/dL — ABNORMAL LOW (ref 6–20)
CO2: 23 mmol/L (ref 22–32)
Calcium: 8.4 mg/dL — ABNORMAL LOW (ref 8.9–10.3)
Chloride: 108 mmol/L (ref 98–111)
Creatinine, Ser: 0.47 mg/dL (ref 0.44–1.00)
GFR, Estimated: >60 mL/min (ref >60)
Glucose, Bld: 96 mg/dL (ref 70–99)
Potassium: 3.9 mmol/L (ref 3.5–5.1)
Sodium: 137 mmol/L (ref 135–145)

## 2024-01-05 LAB — PREPARE RBC (CROSSMATCH)

## 2024-01-05 MED ORDER — SODIUM CHLORIDE 0.9% IV SOLUTION: Freq: Once | INTRAVENOUS | Status: AC

## 2024-01-05 MED ORDER — ACETAMINOPHEN 650 MG RE SUPP: 650.0000 mg | Freq: Four times a day (QID) | RECTAL | Status: DC | PRN

## 2024-01-05 MED ORDER — IBUPROFEN 600 MG PO TABS
600.0000 mg | ORAL_TABLET | Freq: Four times a day (QID) | ORAL | Status: DC | PRN
  Administered 2024-01-05 – 2024-01-07 (×3): 600 mg via ORAL
  Filled 2024-01-05 (×3): qty 1

## 2024-01-05 MED ORDER — ACETAMINOPHEN 325 MG PO TABS
650.0000 mg | ORAL_TABLET | Freq: Four times a day (QID) | ORAL | Status: DC | PRN
  Administered 2024-01-05 (×2): 650 mg via ORAL
  Filled 2024-01-05 (×2): qty 2

## 2024-01-05 NOTE — Clinical SW OB High Risk (Signed)
 [facility]  Clinical Social Worker:  Clearance C[place], [place] Date/Time:  01/05/2024, 12:24 PM Gestational Age on Admission:  28 y.o. Admitting Diagnosis:  Cough and Shortness of Breath  Expected Delivery Date:  05/28/24  Family/Home Environment  Home Address: [address] [place], [place] [address]  MOB reported that she and her 3 children are temporarily staying with her parents at [address] [place], [place] [address] due to disconnection of Information systems manager.   Household Member/Support Name:  Son: Currie Paris (08/09/22) Daughters: Paris Clinton (07/12/2016) Za'Novia Maack (02/10/2015) Other Support:  MOB reported that Paternal grandparents are currently keeping her children while she is in the hospital.    Psychosocial Data  Employment:  Full-time  Type of Work: [employer]  Education: [employer] school graduate  Cultural/Environment Issues Impacting Care:  MOB reported that she cannot afford her current Teacher, early years/pre of $1,038. MOB reported that she is prescribed Suboxone at the [facility] clinic for opiate use disorder. MOB reported the Suboxone helps stop her withdrawal symptoms but disclosed to CSW that she still has cravings. MOB stated the last time she used "pain pills" was about two weeks ago because she was having pain. MOB mentioned that she does not have a prescription for the pain medication.  MOB reported that she received MAT treatment during her last pregnancy. MOB aware of CPS report and potential involvement after she gives birth.   CSW inquired about MOB mental health history. MOB acknowledged that she has depression and anxiety. MOB reported that she is not currently interested in mental health treatment and that in the past she received both medication and therapy for her symptoms. MOB mentioned that she does use (opiates) to help cope with her depression and anxiety. MOB stated, "it levels me out." CSW provided active listening discussed and encouraged MOB  to use healthy coping strategies. CSW asked MOB if she used substances around her children. MOB denied using around her children.   Strengths/Weaknesses/Factors to Consider  Concerns Related to Hospitalization:  MOB hospitalized for shortness of breath and cough.   Previous Pregnancies/Feelings Towards Pregnancy?  Concerns related to being/becoming a mother? MOB stated, "He is here, it's a boy, at this point I'm halfway through it." MOB reported that this pregnancy will be her fourth child and stated initially she did not want the pregnancy. MOB stated "I'm getting my tubes tied" after this pregnancy as she does not desire to have more children.   Social Support (FOB? Who is/will be helping with baby/other kids?): Parent, Extended Family  Recent Stressful Life Events (life changes in past year?):  MOB reported financial strain with affording utilities.    Prenatal Care/Education/Home Preparations:  MOB receives prenatal care at the [facility] clinic. MOB reported that she received a carseat and pack-n-play from the hospital after her the birth of her son in 2023, so she had planned to reuse these items. MOB reported that she receives food stamp benefits and will apply for [facility] closer to her due date.    Domestic Violence (of any type):  No Substance Use During Pregnancy: Yes  Clinical Assessment/Plan:  CSW received consult for "patient answered yes to utility needs (lights are currently shut off)." CSW met with MOB to offer support and complete assessment.     CSW met with MOB at bedside and introduced CSW role. MOB presented alert and oriented. MOB was pleasant, calm and engaged with CSW throughout the assessment.  CSW asked MOB how she had been doing. MOB  reported feeling "sleepy and blah." CSW asked MOB if it was an appropriate time to complete the assessment. MOB was receptive to complete the assessment with CSW. CSW asked MOB if the demographic information on hospital file was correct. MOB  stated that the address on file is her parent's home where she and her three children are currently residing until she can reinstate her electric utilities by paying her balance of $1038. CSW asked MOB if she was employed. MOB reported that she is employed at [employer]. CSW asked MOB if she had reached out to community agencies for support. MOB reported that she had reached out to the Department of Social Services for support, and the agency agreed to pay $600 towards the bill. She had also reached out to [employer] for support, but the agency requested MOB provide tax refund statements. MOB reported she did not have this information to provide. CSW offered to assist MOB with calling [place] [address] for support. CSW contacted [place] [employer] [place], and the representative provided MOB with a Engineer, petroleum. CSW encouraged MOB to follow up.  MOB reported that she would follow up with the agencies today. CSW assessed MOB for additional needs. MOB reported none.

## 2024-01-05 NOTE — Progress Notes (Signed)
 FACULTY PRACTICE ANTEPARTUM(COMPREHENSIVE) NOTE  Tracey Ho is a 28 y.o. [phone] with Estimated Date of Delivery: 05/28/24   By  early ultrasound [redacted]w[redacted]d  who is admitted for respiratory infection with hypoxia  Length of Stay:  3  Days  Date of admission:01/02/2024  Subjective: Reports feeling sleepy this am, but otherwise about the same.  Still notes SOB with activity, but able to ambulate to restroom.  +productive cough.  Denies fever/chills.  No other acute complaints  Patient reports the fetal movement as  some quickening . Patient reports uterine contraction  activity as none. Patient reports  vaginal bleeding as none. Patient describes fluid per vagina as None.  Vitals:  Blood pressure 109/65, pulse 70, temperature 98.1 F (36.7 C), temperature source Oral, resp. rate 16, height 5\' 6"  (1.676 m), weight 89.4 kg, last menstrual period 08/19/2023, SpO2 97%, not currently breastfeeding. Vitals:   01/05/24 0815 01/05/24 0820 01/05/24 0825 01/05/24 0827  BP:    109/65  Pulse:    70  Resp:    16  Temp:    98.1 F (36.7 C)  TempSrc:    Oral  SpO2: 96% 96% 96% 97%  Weight:      Height:       Physical Examination:  General appearance - appears fatigued Mental status - normal mood, behavior, speech, dress, motor activity, and thought processes Chest -Mostly CTAB, Labored respiratory effort Heart - normal rate and regular rhythm Abdomen - gravid, soft and non-tender Extremities - no edema, no calf tenderness bilaterally Skin - warm and dry   Fetal Monitoring:  150bpm by doppler  Labs:  Results for orders placed or performed during the hospital encounter of 01/02/24 (from the past 24 hours)  CBC   Collection Time: 01/05/24  5:06 AM  Result Value Ref Range   WBC 5.6 4.0 - 10.5 K/uL   RBC 2.89 (L) 3.87 - 5.11 MIL/uL   Hemoglobin 6.7 (LL) 12.0 - 15.0 g/dL   HCT 41.3 (L) 24.4 - 01.0 %   MCV 77.5 (L) 80.0 - 100.0 fL   MCH 23.2 (L) 26.0 - 34.0 pg   MCHC 29.9 (L) 30.0 - 36.0 g/dL    RDW 27.2 (H) 53.6 - 15.5 %   Platelets 221 150 - 400 K/uL   nRBC 0.5 (H) 0.0 - 0.2 %  Basic metabolic panel with GFR   Collection Time: 01/05/24  5:06 AM  Result Value Ref Range   Sodium 137 135 - 145 mmol/L   Potassium 3.9 3.5 - 5.1 mmol/L   Chloride 108 98 - 111 mmol/L   CO2 23 22 - 32 mmol/L   Glucose, Bld 96 70 - 99 mg/dL   BUN 5 (L) 6 - 20 mg/dL   Creatinine, Ser 6.44 0.44 - 1.00 mg/dL   Calcium 8.4 (L) 8.9 - 10.3 mg/dL   GFR, Estimated >03 >47 mL/min   Anion gap 6 5 - 15  Prepare RBC (crossmatch)   Collection Time: 01/05/24  9:29 AM  Result Value Ref Range   Order Confirmation      ORDER PROCESSED BY BLOOD BANK Performed at [facility], [address], [place], [place] [address]     Imaging Studies:    ECHOCARDIOGRAM COMPLETE Result Date: 01/03/2024    ECHOCARDIOGRAM REPORT   Patient Name:   Tracey Ho Date of Exam: 01/03/2024 Medical Rec #:  [id number]    Height:       66.0 in Accession #:    [id number]  Weight:       197.2 lb Date of Birth:  1996-07-22    BSA:          1.988 m Patient Age:    27 years     BP:           108/68 mmHg Patient Gender: F            HR:           104 bpm. Exam Location:  Inpatient Procedure: 2D Echo, Color Doppler and Cardiac Doppler (Both Spectral and Color            Flow Doppler were utilized during procedure). Indications:    R06.9 DOE  History:        Patient has no prior history of Echocardiogram examinations.  Sonographer:    Sherline Distel Senior RDCS Referring Phys: [id number] SUBRINA SUNDIL  Sonographer Comments: [redacted] weeks pregnant at time of study IMPRESSIONS  1. Left ventricular ejection fraction, by estimation, is 70 to 75%. The left ventricle has hyperdynamic function. The left ventricle has no regional wall motion abnormalities. Indeterminate diastolic filling due to E-A fusion.  2. Right ventricular systolic function is hyperdynamic. The right ventricular size is normal. There is normal pulmonary artery systolic pressure. The estimated right  ventricular systolic pressure is 26.6 mmHg.  3. The mitral valve is normal in structure. No evidence of mitral valve regurgitation.  4. The aortic valve is tricuspid. Aortic valve regurgitation is not visualized. No aortic stenosis is present.  5. The inferior vena cava is normal in size with greater than 50% respiratory variability, suggesting right atrial pressure of 3 mmHg. Comparison(s): No prior Echocardiogram. FINDINGS  Left Ventricle: No 3D or strain transmitted. Left ventricular ejection fraction, by estimation, is 70 to 75%. The left ventricle has hyperdynamic function. The left ventricle has no regional wall motion abnormalities. Strain was performed and the global  longitudinal strain is indeterminate. The left ventricular internal cavity size was normal in size. There is no left ventricular hypertrophy. Indeterminate diastolic filling due to E-A fusion. Right Ventricle: The right ventricular size is normal. No increase in right ventricular wall thickness. Right ventricular systolic function is hyperdynamic. There is normal pulmonary artery systolic pressure. The tricuspid regurgitant velocity is 2.43 m/s, and with an assumed right atrial pressure of 3 mmHg, the estimated right ventricular systolic pressure is 26.6 mmHg. Left Atrium: Left atrial size was normal in size. Right Atrium: Right atrial size was normal in size. Pericardium: Trivial pericardial effusion is present. Mitral Valve: The mitral valve is normal in structure. No evidence of mitral valve regurgitation. Tricuspid Valve: The tricuspid valve is normal in structure. Tricuspid valve regurgitation is mild. Aortic Valve: The aortic valve is tricuspid. Aortic valve regurgitation is not visualized. No aortic stenosis is present. Pulmonic Valve: The pulmonic valve was normal in structure. Pulmonic valve regurgitation is trivial. Aorta: The ascending aorta was not well visualized and the aortic root, ascending aorta and aortic arch are all  structurally normal, with no evidence of dilitation or obstruction. Venous: The inferior vena cava is normal in size with greater than 50% respiratory variability, suggesting right atrial pressure of 3 mmHg. IAS/Shunts: No atrial level shunt detected by color flow Doppler. Additional Comments: 3D was performed not requiring image post processing on an independent workstation and was indeterminate.  LEFT VENTRICLE PLAX 2D LVIDd:         5.10 cm   Diastology LVIDs:         2.80 cm  LV e' medial: 13.30 cm/s LV PW:         0.80 cm LV IVS:        0.80 cm LVOT diam:     2.40 cm LV SV:         106 LV SV Index:   53 LVOT Area:     4.52 cm  RIGHT VENTRICLE RV S prime:     18.00 cm/s TAPSE (M-mode): 2.1 cm LEFT ATRIUM             Index        RIGHT ATRIUM           Index LA diam:        2.90 cm 1.46 cm/m   RA Area:     13.60 cm LA Vol (A2C):   33.3 ml 16.75 ml/m  RA Volume:   30.30 ml  15.24 ml/m LA Vol (A4C):   37.0 ml 18.61 ml/m LA Biplane Vol: 36.4 ml 18.31 ml/m  AORTIC VALVE LVOT Vmax:   127.00 cm/s LVOT Vmean:  90.500 cm/s LVOT VTI:    0.235 m  AORTA Ao Root diam: 2.90 cm Ao Asc diam:  2.80 cm TRICUSPID VALVE TR Peak grad:   23.6 mmHg TR Vmax:        243.00 cm/s  SHUNTS Systemic VTI:  0.24 m Systemic Diam: 2.40 cm Gloriann Larger MD Electronically signed by Gloriann Larger MD Signature Date/Time: 01/03/2024/1:22:06 PM    Final    DG CHEST PORT 1 VIEW Result Date: 01/02/2024 CLINICAL DATA:  Cough, wheezing EXAM: PORTABLE CHEST 1 VIEW COMPARISON:  None Available. FINDINGS: The heart size and mediastinal contours are within normal limits. Both lungs are clear. The visualized skeletal structures are unremarkable. IMPRESSION: No active disease. Electronically Signed   By: Worthy Heads M.D.   On: 01/02/2024 21:12      ASSESSMENT: [id number] [redacted]w[redacted]d Estimated Date of Delivery: 05/28/24  Patient Active Problem List   Diagnosis Date Noted   Acute bronchitis 01/04/2024   Tobacco abuse 01/03/2024    Shortness of breath 01/02/2024   Marijuana smoker 01/02/2024   Dyspnea 01/02/2024   Acute hypoxic respiratory failure (HCC) 01/02/2024   SOB (shortness of breath) 01/02/2024   Continuous dependence on cigarette smoking 01/02/2024   Reactive airway disease with acute exacerbation 01/02/2024   Iron deficiency anemia 12/09/2023   Maternal drug use complicating pregnancy, antepartum 12/09/2023   Oral thrush 12/04/2023   Chlamydia trachomatis infection in mother during second trimester of pregnancy 12/01/2023   Anemia of pregnancy 12/01/2023   Intrauterine pregnancy 11/18/2023   Hidradenitis suppurativa 03/25/2023   Anxiety 02/18/2023   History of gestational hypertension 10/17/2022   Unwanted fertility 05/02/2022   Opioid use disorder 03/27/2022   History of gestational diabetes 01/11/2022   Genital warts 04/16/2021   Recurrent genital herpes 10/22/2020   Migraine without aura and without status migrainosus, not intractable 10/28/2017   BMI 30.0-30.9,adult 11/20/2015   History of suicide attempt 10/09/2012   History of self mutilation 10/09/2012   MDD (major depressive disorder), single episode, moderate (HCC) 08/04/2012   ADHD (attention deficit hyperactivity disorder), combined type 08/04/2012   Cannabis abuse 08/04/2012    PLAN: 1) Acute bronchitis, +Rhinovirus -appreciate management per primary care team -currently on steroids, DuoNebs and guaifenesin -s/p antibiotics Rocephin/Azithromycin -currently on 3L [place], will likely plan to ween as tolerated  2) Iron def. Anemia -Hgb today 6.7 despite IV iron -reviewed with hospitalist team, plan for transfusion of 2uPRBC today  3)  OUD, tobacco use -continue suboxone TID -on nicoderm patch  4) Maternal/Fetal care -Doppler daily- remains reassuring -routine OB care -Lovenox for DVT prophylaxis -valtrex twice daily  Norelle Runnion, DO Attending Obstetrician & Gynecologist, [facility] for [employer], [facility]

## 2024-01-05 NOTE — Plan of Care (Signed)

## 2024-01-05 NOTE — Progress Notes (Addendum)
 Progress Note   Patient: Tracey Ho ZOX:[id number] DOB: Jan 31, 1996 DOA: 01/02/2024     3 DOS: the patient was seen and examined on 01/05/2024   Brief hospital course: 28 year old woman with PMH of SUD, HSV, anemia, [id number] currently [redacted] weeks pregnant who was admitted with cough, shortness of breath.  Patient was found to be hypoxic, with positive RVP for rhino/enterovirus and is being treated for likely bronchitis.  Assessment and Plan:  Acute viral bronchitis Patient presenting with wheezing, shortness of breath in the setting of rhinovirus infection.  No prior history of asthma or similar episodes. Chest x-ray unremarkable. - Received steroids, DuoNebs, guaifenesin. - Azithromycin discontinued. -Will DC steroids.  Rhino/enterovirus infection RVP was positive for rhino/enterovirus. -Continue symptomatic management.  Acute hypoxic respiratory failure Due to bronchitis. Presented with SpO2 <= 88% on RA with clinical signs of respiratory distress. Patient requiring 3 L/min supplemental oxygen. No baseline oxygen use. TTE shows EF 70 to 75% with hyperdynamic left and right ventricular function.  Hyperdynamic circulation expected in pregnancy and especially in the setting of anemia. - Continue supplemental oxygen and wean as tolerated.  Anticipate patient will quickly wean off oxygen in the next 1 to 2 days. - Incentive spirometry.  Iron deficiency anemia Presented with hemoglobin 7.9, low MCV. Presented with ferritin: 6 Patient is s/p transfusion of Feraheme x2 on 4/12. Hemoglobin trended down to 6.7 on 4/14. Not clear why blood counts have dropped.  Patient denies melena, bleeding. - Transfusing 2 units PRBCs. - Per patient, she has a scheduled iron infusion later this month. - DC Lovenox.  First trimester pregnancy [id number], currently [redacted] weeks pregnant. OB/GYN following. - Continue care per OB/GYN  Substance use disorder History of chronic opioid dependence as well as  tobacco use. - Continue Suboxone. - Continue nicotine patch.  Hypokalemia, present on admission, now resolved.   - will continue to monitor     Subjective: Patient complains of fatigue.  She remains on 3 L/min supplemental oxygen.  Chest exam is now normal.  Hemoglobin now 6.7.  Discussed with OB/GYN.  Plan to transfuse 2 units PRBCs today.  Physical Exam: Vitals:   01/05/24 1028 01/05/24 1056 01/05/24 1225 01/05/24 1232  BP: 120/70 121/73 131/74 123/69  Pulse: 80 92 90 87  Resp: 18 18 18    Temp: 98.5 F (36.9 C) 98.2 F (36.8 C) 98.3 F (36.8 C)   TempSrc: Axillary Axillary Oral   SpO2: 99% 96% 94% 94%  Weight:      Height:         General: Alert, oriented X3  Eyes: Pupils equal, reactive  Oral cavity: moist mucous membranes  Head: Atraumatic, normocephalic  Neck: supple  Chest: Clear to auscultation CVS: S1,S2 RRR. No murmurs  Abd: No distention, soft, non-tender. No masses palpable  Extr: No edema   MSK: No joint deformities or swelling  Neurological: Grossly intact.    Data Reviewed:     Latest Ref Rng & Units 01/05/2024    5:06 AM 01/03/2024    7:51 AM 01/02/2024    9:07 PM  CBC  WBC 4.0 - 10.5 K/uL 5.6  5.2  4.3   Hemoglobin 12.0 - 15.0 g/dL 6.7  7.7  7.9   Hematocrit 36.0 - 46.0 % 22.4  25.8  26.8   Platelets 150 - 400 K/uL 221  231  195       Latest Ref Rng & Units 01/05/2024    5:06 AM 01/03/2024    7:51 AM 01/02/2024  9:07 PM  BMP  Glucose 70 - 99 mg/dL 96  960  454   BUN 6 - 20 mg/dL 5  5  5    Creatinine 0.44 - 1.00 mg/dL 0.98  1.19  1.47   Sodium 135 - 145 mmol/L 137  135  135   Potassium 3.5 - 5.1 mmol/L 3.9  4.1  3.2   Chloride 98 - 111 mmol/L 108  107  105   CO2 22 - 32 mmol/L 23  19  19    Calcium 8.9 - 10.3 mg/dL 8.4  8.3  8.4     Family Communication: n/a  Disposition: Status is: Inpatient Remains inpatient appropriate because: Ongoing need for: Supplemental O2, need for blood transfusion. Planned Discharge Destination:  Home  DVT ppx: SCDs     Time spent: 50 minutes  Author: MDALA-GAUSI, Rani Idler AGATHA, MD 01/05/2024 1:33 PM  For on call review [email or web address].

## 2024-01-06 ENCOUNTER — Inpatient Hospital Stay: Payer: Self-pay

## 2024-01-06 DIAGNOSIS — J209 Acute bronchitis, unspecified: Secondary | ICD-10-CM

## 2024-01-06 LAB — BPAM RBC
Blood Product Expiration Date: 202504182359
Blood Product Expiration Date: 202504192359
ISSUE DATE / TIME: 202504141027
ISSUE DATE / TIME: 202504141535
Unit Type and Rh: 9500
Unit Type and Rh: 9500

## 2024-01-06 LAB — TYPE AND SCREEN
ABO/RH(D): O NEG
Antibody Screen: NEGATIVE
Unit division: 0
Unit division: 0

## 2024-01-06 LAB — CBC
HCT: 27.5 % — ABNORMAL LOW (ref 36.0–46.0)
Hemoglobin: 8.3 g/dL — ABNORMAL LOW (ref 12.0–15.0)
MCH: 23.9 pg — ABNORMAL LOW (ref 26.0–34.0)
MCHC: 30.2 g/dL (ref 30.0–36.0)
MCV: 79.3 fL — ABNORMAL LOW (ref 80.0–100.0)
Platelets: 248 10*3/uL (ref 150–400)
RBC: 3.47 MIL/uL — ABNORMAL LOW (ref 3.87–5.11)
RDW: 17.1 % — ABNORMAL HIGH (ref 11.5–15.5)
WBC: 7.6 10*3/uL (ref 4.0–10.5)
nRBC: 0.8 % — ABNORMAL HIGH (ref 0.0–0.2)

## 2024-01-06 MED ORDER — CYCLOBENZAPRINE HCL 10 MG PO TABS
5.0000 mg | ORAL_TABLET | Freq: Three times a day (TID) | ORAL | Status: DC | PRN
  Administered 2024-01-06 – 2024-01-07 (×3): 5 mg via ORAL
  Filled 2024-01-06 (×3): qty 1

## 2024-01-06 MED ORDER — SODIUM CHLORIDE 0.9 % IV SOLN
500.0000 mg | INTRAVENOUS | Status: AC
  Administered 2024-01-06: 500 mg via INTRAVENOUS
  Filled 2024-01-06: qty 5

## 2024-01-06 MED ORDER — AZITHROMYCIN 250 MG PO TABS
1000.0000 mg | ORAL_TABLET | Freq: Once | ORAL | Status: DC
  Filled 2024-01-06: qty 4

## 2024-01-06 MED ORDER — GUAIFENESIN ER 600 MG PO TB12
600.0000 mg | ORAL_TABLET | Freq: Two times a day (BID) | ORAL | Status: DC
  Administered 2024-01-06 – 2024-01-07 (×3): 600 mg via ORAL
  Filled 2024-01-06 (×4): qty 1

## 2024-01-06 MED ORDER — CYCLOBENZAPRINE HCL 10 MG PO TABS
10.0000 mg | ORAL_TABLET | Freq: Once | ORAL | Status: AC
  Administered 2024-01-06: 10 mg via ORAL
  Filled 2024-01-06: qty 1

## 2024-01-06 NOTE — Progress Notes (Addendum)
 FACULTY PRACTICE ANTEPARTUM(COMPREHENSIVE) NOTE  Tracey Ho is a 28 y.o. [id number] with Estimated Date of Delivery: 05/28/24   By  early ultrasound [redacted]w[redacted]d  who is admitted for respiratory infection with hypoxia.    Length of Stay:  4  Days  Date of admission:01/02/2024  Subjective: Pt sitting up in bed eating breakfast.  Noting low back pain that radiates to hips.  Overnight received Flexeril and noted improvement of her symptoms.  She does note that her breathing has improved. Patient reports the fetal movement as  somewhat . Patient reports uterine contraction  activity as none. Patient reports  vaginal bleeding as none. Patient describes fluid per vagina as None.  Vitals:  Blood pressure 122/70, pulse 93, temperature 98.1 F (36.7 C), temperature source Oral, resp. rate 16, height 5\' 6"  (1.676 m), weight 89.4 kg, last menstrual period 08/19/2023, SpO2 95%, not currently breastfeeding. Vitals:   01/06/24 0700 01/06/24 0833 01/06/24 0855 01/06/24 1000  BP:  122/70    Pulse:  93    Resp:  16    Temp:  98.1 F (36.7 C)    TempSrc:  Oral    SpO2: 96% 95% 95% 95%  Weight:      Height:       Physical Examination:  General appearance - alert, well appearing, and in no distress Mental status - normal mood, behavior, speech, dress, motor activity, and thought processes Chest - normal respiratory effort, decreased breath sounds bilaterally Heart - normal rate and regular rhythm Abdomen - gravid soft and non-tender, no rebound, no guarding Back: no CVA tenderness.  Pt notes lumbar pain with radiation- non-tender to palpation Musculoskeletal - no calf tenderness bilaterally Extremities - no edema Skin - warm and dry   Fetal Monitoring:  155bpm by doppler  Labs:  Results for orders placed or performed during the hospital encounter of 01/02/24 (from the past 24 hours)  CBC   Collection Time: 01/05/24 11:28 PM  Result Value Ref Range   WBC 8.1 4.0 - 10.5 K/uL   RBC 3.46 (L) 3.87 - 5.11  MIL/uL   Hemoglobin 8.3 (L) 12.0 - 15.0 g/dL   HCT 08.6 (L) 57.8 - 46.9 %   MCV 78.9 (L) 80.0 - 100.0 fL   MCH 24.0 (L) 26.0 - 34.0 pg   MCHC 30.4 30.0 - 36.0 g/dL   RDW 62.9 (H) 52.8 - 41.3 %   Platelets 248 150 - 400 K/uL   nRBC 0.6 (H) 0.0 - 0.2 %  CBC   Collection Time: 01/06/24  5:15 AM  Result Value Ref Range   WBC 7.6 4.0 - 10.5 K/uL   RBC 3.47 (L) 3.87 - 5.11 MIL/uL   Hemoglobin 8.3 (L) 12.0 - 15.0 g/dL   HCT 24.4 (L) 01.0 - 27.2 %   MCV 79.3 (L) 80.0 - 100.0 fL   MCH 23.9 (L) 26.0 - 34.0 pg   MCHC 30.2 30.0 - 36.0 g/dL   RDW 53.6 (H) 64.4 - 03.4 %   Platelets 248 150 - 400 K/uL   nRBC 0.8 (H) 0.0 - 0.2 %    Imaging Studies:    ECHOCARDIOGRAM COMPLETE Result Date: 01/03/2024    ECHOCARDIOGRAM REPORT   Patient Name:   Tracey Ho Date of Exam: 01/03/2024 Medical Rec #:  [id number]    Height:       66.0 in Accession #:    [id number]   Weight:       197.2 lb Date of Birth:  1995-09-27  BSA:          1.988 m Patient Age:    27 years     BP:           108/68 mmHg Patient Gender: F            HR:           104 bpm. Exam Location:  Inpatient Procedure: 2D Echo, Color Doppler and Cardiac Doppler (Both Spectral and Color            Flow Doppler were utilized during procedure). Indications:    R06.9 DOE  History:        Patient has no prior history of Echocardiogram examinations.  Sonographer:    Irving Burton Senior RDCS Referring Phys: [id number] SUBRINA SUNDIL  Sonographer Comments: [redacted] weeks pregnant at time of study IMPRESSIONS  1. Left ventricular ejection fraction, by estimation, is 70 to 75%. The left ventricle has hyperdynamic function. The left ventricle has no regional wall motion abnormalities. Indeterminate diastolic filling due to E-A fusion.  2. Right ventricular systolic function is hyperdynamic. The right ventricular size is normal. There is normal pulmonary artery systolic pressure. The estimated right ventricular systolic pressure is 26.6 mmHg.  3. The mitral valve is normal in  structure. No evidence of mitral valve regurgitation.  4. The aortic valve is tricuspid. Aortic valve regurgitation is not visualized. No aortic stenosis is present.  5. The inferior vena cava is normal in size with greater than 50% respiratory variability, suggesting right atrial pressure of 3 mmHg. Comparison(s): No prior Echocardiogram. FINDINGS  Left Ventricle: No 3D or strain transmitted. Left ventricular ejection fraction, by estimation, is 70 to 75%. The left ventricle has hyperdynamic function. The left ventricle has no regional wall motion abnormalities. Strain was performed and the global  longitudinal strain is indeterminate. The left ventricular internal cavity size was normal in size. There is no left ventricular hypertrophy. Indeterminate diastolic filling due to E-A fusion. Right Ventricle: The right ventricular size is normal. No increase in right ventricular wall thickness. Right ventricular systolic function is hyperdynamic. There is normal pulmonary artery systolic pressure. The tricuspid regurgitant velocity is 2.43 m/s, and with an assumed right atrial pressure of 3 mmHg, the estimated right ventricular systolic pressure is 26.6 mmHg. Left Atrium: Left atrial size was normal in size. Right Atrium: Right atrial size was normal in size. Pericardium: Trivial pericardial effusion is present. Mitral Valve: The mitral valve is normal in structure. No evidence of mitral valve regurgitation. Tricuspid Valve: The tricuspid valve is normal in structure. Tricuspid valve regurgitation is mild. Aortic Valve: The aortic valve is tricuspid. Aortic valve regurgitation is not visualized. No aortic stenosis is present. Pulmonic Valve: The pulmonic valve was normal in structure. Pulmonic valve regurgitation is trivial. Aorta: The ascending aorta was not well visualized and the aortic root, ascending aorta and aortic arch are all structurally normal, with no evidence of dilitation or obstruction. Venous: The  inferior vena cava is normal in size with greater than 50% respiratory variability, suggesting right atrial pressure of 3 mmHg. IAS/Shunts: No atrial level shunt detected by color flow Doppler. Additional Comments: 3D was performed not requiring image post processing on an independent workstation and was indeterminate.  LEFT VENTRICLE PLAX 2D LVIDd:         5.10 cm   Diastology LVIDs:         2.80 cm   LV e' medial: 13.30 cm/s LV PW:  0.80 cm LV IVS:        0.80 cm LVOT diam:     2.40 cm LV SV:         106 LV SV Index:   53 LVOT Area:     4.52 cm  RIGHT VENTRICLE RV S prime:     18.00 cm/s TAPSE (M-mode): 2.1 cm LEFT ATRIUM             Index        RIGHT ATRIUM           Index LA diam:        2.90 cm 1.46 cm/m   RA Area:     13.60 cm LA Vol (A2C):   33.3 ml 16.75 ml/m  RA Volume:   30.30 ml  15.24 ml/m LA Vol (A4C):   37.0 ml 18.61 ml/m LA Biplane Vol: 36.4 ml 18.31 ml/m  AORTIC VALVE LVOT Vmax:   127.00 cm/s LVOT Vmean:  90.500 cm/s LVOT VTI:    0.235 m  AORTA Ao Root diam: 2.90 cm Ao Asc diam:  2.80 cm TRICUSPID VALVE TR Peak grad:   23.6 mmHg TR Vmax:        243.00 cm/s  SHUNTS Systemic VTI:  0.24 m Systemic Diam: 2.40 cm Riley Lam MD Electronically signed by Riley Lam MD Signature Date/Time: 01/03/2024/1:22:06 PM    Final    DG CHEST PORT 1 VIEW Result Date: 01/02/2024 CLINICAL DATA:  Cough, wheezing EXAM: PORTABLE CHEST 1 VIEW COMPARISON:  None Available. FINDINGS: The heart size and mediastinal contours are within normal limits. Both lungs are clear. The visualized skeletal structures are unremarkable. IMPRESSION: No active disease. Electronically Signed   By: Helyn Numbers M.D.   On: 01/02/2024 21:12     ASSESSMENT: [id number] [redacted]w[redacted]d Estimated Date of Delivery: 05/28/24  Patient Active Problem List   Diagnosis Date Noted   Acute bronchitis 01/04/2024   Tobacco abuse 01/03/2024   Shortness of breath 01/02/2024   Marijuana smoker 01/02/2024   Dyspnea 01/02/2024    Acute hypoxic respiratory failure (HCC) 01/02/2024   SOB (shortness of breath) 01/02/2024   Continuous dependence on cigarette smoking 01/02/2024   Reactive airway disease with acute exacerbation 01/02/2024   Iron deficiency anemia 12/09/2023   Maternal drug use complicating pregnancy, antepartum 12/09/2023   Oral thrush 12/04/2023   Chlamydia trachomatis infection in mother during second trimester of pregnancy 12/01/2023   Anemia of pregnancy 12/01/2023   Intrauterine pregnancy 11/18/2023   Hidradenitis suppurativa 03/25/2023   Anxiety 02/18/2023   History of gestational hypertension 10/17/2022   Unwanted fertility 05/02/2022   Opioid use disorder 03/27/2022   History of gestational diabetes 01/11/2022   Genital warts 04/16/2021   Recurrent genital herpes 10/22/2020   Migraine without aura and without status migrainosus, not intractable 10/28/2017   BMI 30.0-30.9,adult 11/20/2015   History of suicide attempt 10/09/2012   History of self mutilation 10/09/2012   MDD (major depressive disorder), single episode, moderate (HCC) 08/04/2012   ADHD (attention deficit hyperactivity disorder), combined type 08/04/2012   Cannabis abuse 08/04/2012    PLAN: 1) Acute bronchitis/+Rhinovirus -pt now on room air, subjectively appears as though symptoms are improving -currently on steroids, Duonebs and guaifenesin -appreciate recs per primary team  2) Iron def. Anemia -s/p 2upRBC with appropriate response, CBC stable as above  3) OUD, tobacco use -continue suboxone TID and nicoderm patch  4) Maternal/fetal care -Back pain- suspect musculoskeletal in nature, flexeril prn -continue dopplers daily -routine OB care -  Lovenox for DVT prophylaxis -continue valtrex -Chlamydia positive- declining oral medication due to GI symptoms, IV Azithromycin ordered/pt agreeable  DISP: Continue care as outlined above, appreciated management per primary team.  From OB perspective, pt is stable  Zi Newbury, DO Attending Obstetrician & Gynecologist, [facility] for [employer], [facility]

## 2024-01-06 NOTE — Progress Notes (Unsigned)
 Called pt and she was in the hospital. She said it wasn't a good time and to call her back tomorrow.

## 2024-01-06 NOTE — Progress Notes (Signed)
 PROGRESS NOTE  Tracey Ho  ZOX:[id number] DOB: 12/04/1995 DOA: 01/02/2024 PCP: Pcp, No   Brief Narrative: Patient is a 28 year old female with history of substance abuse, HSV, chronic  anemia, currently [redacted] weeks pregnant who presented with complaint of shortness of breath, cough.  Found to be hypoxemic on presentation, needing oxygen.  Respiratory viral panel showed rhino/enterovirus.  Treated for bronchitis.  Has been able to be weaned to room air today.  Possible plan for discharge to home tomorrow if continues to improve.  Assessment & Plan:  Principal Problem:   Acute bronchitis Active Problems:   Shortness of breath   Reactive airway disease with acute exacerbation   Cannabis abuse   Opioid use disorder   Intrauterine pregnancy   Anemia of pregnancy   Iron deficiency anemia   Marijuana smoker   Dyspnea   Acute hypoxic respiratory failure (HCC)   SOB (shortness of breath)   Continuous dependence on cigarette smoking   Tobacco abuse  Acute viral bronchitis: Presented with wheezing, shortness of breath.  Respiratory viral panel came to be positive for rhinovirus.  No history of asthma.  Chest x-ray did not show pneumonia.  Received steroids, bronchodilators, Mucinex.  Steroids discontinued.  Currently on oral antibiotic.  Continue Mucinex for cough  Acute hypoxic respiratory failure: Secondary to bronchitis.  Initially required oxygen.  Has been weaned to room air today.  Continue to monitor on room air today.  TTE showed EF of 70 to 75%, hyperdynamic left and right medical function.  Continue incentive spirometer  Iron deficiency anemia: Presented with hemoglobin in the range of 7.9.  Given 2 doses of Feraheme.  Hemoglobin documented of 6.7 on 4/4, given 2 units of blood transfusion.  No evidence of acute blood loss  First trimester pregnancy: Currently 19 weeks.  OB/GYN following  Substance use disorder: History of chronic opiate dependence as well as tobacco use.  On  Suboxone, nicotine patch  Hypokalemia: Supplemented and corrected           DVT prophylaxis:SCDs Start: 01/02/24 2229 Place TED hose Start: 01/02/24 2229     Code Status: Full Code  Family Communication: None at bedside  Patient status:Inpatient  Patient is from :home  Anticipated discharge WJ:XBJY  Estimated DC date:tomorrow   Consultants: OB/GYN  Procedures:None  Antimicrobials:  Anti-infectives (From admission, onward)    Start     Dose/Rate Route Frequency Ordered Stop   01/06/24 1215  azithromycin (ZITHROMAX) 500 mg in sodium chloride 0.9 % 250 mL IVPB        500 mg 250 mL/hr over 60 Minutes Intravenous Every 24 hours 01/06/24 1115 01/07/24 1214   01/06/24 0915  azithromycin (ZITHROMAX) tablet 1,000 mg  Status:  Discontinued        1,000 mg Oral  Once 01/06/24 0823 01/06/24 1115   01/04/24 2000  amoxicillin (AMOXIL) capsule 500 mg        500 mg Oral Every 8 hours 01/04/24 1911 01/11/24 2159   01/04/24 1700  azithromycin (ZITHROMAX) tablet 1,000 mg  Status:  Discontinued        1,000 mg Oral  Once 01/04/24 1602 01/05/24 1416   01/03/24 2000  valACYclovir (VALTREX) tablet 1,000 mg        1,000 mg Oral 2 times daily 01/03/24 1856 01/10/24 2159   01/02/24 2300  azithromycin (ZITHROMAX) 500 mg in sodium chloride 0.9 % 250 mL IVPB  Status:  Discontinued        500 mg 250 mL/hr over 60  Minutes Intravenous Every 24 hours 01/02/24 2243 01/03/24 1433       Subjective: Patient seen and examined at bedside today.  Hemodynamically stable.  Febrile.  Lying in bed.  Complains of some back pain.  Does not complain of abdominal pain, nausea, vomiting or shortness of breath.  Has been weaned to room air since this morning.  Complains of some productive cough.  Mucinex scheduled  Objective: Vitals:   01/06/24 0833 01/06/24 0855 01/06/24 1000 01/06/24 1204  BP: 122/70   (!) 100/57  Pulse: 93   89  Resp: 16   14  Temp: 98.1 F (36.7 C)   98.2 F (36.8 C)  TempSrc:  Oral   Oral  SpO2: 95% 95% 95% 94%  Weight:      Height:        Intake/Output Summary (Last 24 hours) at 01/06/2024 1347 Last data filed at 01/05/2024 1855 Gross per 24 hour  Intake 703 ml  Output --  Net 703 ml   Filed Weights   01/02/24 1926  Weight: 89.4 kg    Examination:  General exam: Overall comfortable, not in distress, weak appearing. HEENT: PERRL Respiratory system:  no wheezes or crackles  Cardiovascular system: S1 & S2 heard, RRR.  Gastrointestinal system: Mildly distended abdomen due to pregnancy, soft and nontender. Central nervous system: Alert and oriented Extremities: No edema, no clubbing ,no cyanosis Skin: No rashes, no ulcers,no icterus     Data Reviewed: I have personally reviewed following labs and imaging studies  CBC: Recent Labs  Lab 01/02/24 2107 01/03/24 0751 01/05/24 0506 01/05/24 2328 01/06/24 0515  WBC 4.3 5.2 5.6 8.1 7.6  NEUTROABS 3.2  --   --   --   --   HGB 7.9* 7.7* 6.7* 8.3* 8.3*  HCT 26.8* 25.8* 22.4* 27.3* 27.5*  MCV 76.4* 75.9* 77.5* 78.9* 79.3*  PLT 195 231 221 248 248   Basic Metabolic Panel: Recent Labs  Lab 01/02/24 2107 01/03/24 0751 01/05/24 0506  NA 135 135 137  K 3.2* 4.1 3.9  CL 105 107 108  CO2 19* 19* 23  GLUCOSE 102* 121* 96  BUN 5* 5* 5*  CREATININE 0.64 0.59 0.47  CALCIUM 8.4* 8.3* 8.4*     Recent Results (from the past 240 hours)  Respiratory (~20 pathogens) panel by PCR     Status: Abnormal   Collection Time: 01/02/24  7:42 PM   Specimen: Nasopharyngeal Swab; Respiratory  Result Value Ref Range Status   Adenovirus NOT DETECTED NOT DETECTED Final   Coronavirus 229E NOT DETECTED NOT DETECTED Final    Comment: (NOTE) The Coronavirus on the Respiratory Panel, DOES NOT test for the novel  Coronavirus (2019 nCoV)    Coronavirus HKU1 NOT DETECTED NOT DETECTED Final   Coronavirus NL63 NOT DETECTED NOT DETECTED Final   Coronavirus OC43 NOT DETECTED NOT DETECTED Final   Metapneumovirus NOT  DETECTED NOT DETECTED Final   Rhinovirus / Enterovirus DETECTED (A) NOT DETECTED Final   Influenza A NOT DETECTED NOT DETECTED Final   Influenza B NOT DETECTED NOT DETECTED Final   Parainfluenza Virus 1 NOT DETECTED NOT DETECTED Final   Parainfluenza Virus 2 NOT DETECTED NOT DETECTED Final   Parainfluenza Virus 3 NOT DETECTED NOT DETECTED Final   Parainfluenza Virus 4 NOT DETECTED NOT DETECTED Final   Respiratory Syncytial Virus NOT DETECTED NOT DETECTED Final   Bordetella pertussis NOT DETECTED NOT DETECTED Final   Bordetella Parapertussis NOT DETECTED NOT DETECTED Final   Chlamydophila pneumoniae  NOT DETECTED NOT DETECTED Final   Mycoplasma pneumoniae NOT DETECTED NOT DETECTED Final    Comment: Performed at [facility], [address] [place], [place] [address]     Radiology Studies: No results found.  Scheduled Meds:  amoxicillin  500 mg Oral Q8H   buprenorphine-naloxone  1 tablet Sublingual TID   fluticasone  2 spray Each Nare Daily   ipratropium-albuterol  3 mL Nebulization Q6H   nicotine  14 mg Transdermal Daily   polyethylene glycol  17 g Oral Daily   prenatal multivitamin  1 tablet Oral Q1200   sodium chloride flush  3 mL Intravenous Q12H   valACYclovir  1,000 mg Oral BID   Continuous Infusions:  azithromycin 500 mg (01/06/24 1250)     LOS: 4 days   Leona Rake, MD [facility] P4/15/2025, 1:47 PM

## 2024-01-07 ENCOUNTER — Encounter: Payer: Self-pay | Admitting: Family Medicine

## 2024-01-07 ENCOUNTER — Other Ambulatory Visit (HOSPITAL_COMMUNITY): Payer: Self-pay

## 2024-01-07 LAB — CBC
HCT: 31 % — ABNORMAL LOW (ref 36.0–46.0)
Hemoglobin: 9.2 g/dL — ABNORMAL LOW (ref 12.0–15.0)
MCH: 24.1 pg — ABNORMAL LOW (ref 26.0–34.0)
MCHC: 29.7 g/dL — ABNORMAL LOW (ref 30.0–36.0)
MCV: 81.4 fL (ref 80.0–100.0)
Platelets: 270 10*3/uL (ref 150–400)
RBC: 3.81 MIL/uL — ABNORMAL LOW (ref 3.87–5.11)
RDW: 18 % — ABNORMAL HIGH (ref 11.5–15.5)
WBC: 8.6 10*3/uL (ref 4.0–10.5)
nRBC: 0.8 % — ABNORMAL HIGH (ref 0.0–0.2)

## 2024-01-07 LAB — BASIC METABOLIC PANEL WITH GFR
Anion gap: 9 (ref 5–15)
BUN: 9 mg/dL (ref 6–20)
CO2: 23 mmol/L (ref 22–32)
Calcium: 8.9 mg/dL (ref 8.9–10.3)
Chloride: 105 mmol/L (ref 98–111)
Creatinine, Ser: 0.59 mg/dL (ref 0.44–1.00)
GFR, Estimated: >60 mL/min (ref >60)
Glucose, Bld: 79 mg/dL (ref 70–99)
Potassium: 3.9 mmol/L (ref 3.5–5.1)
Sodium: 137 mmol/L (ref 135–145)

## 2024-01-07 MED ORDER — NICOTINE 14 MG/24HR TD PT24
14.0000 mg | MEDICATED_PATCH | Freq: Every day | TRANSDERMAL | 0 refills | Status: AC
  Filled 2024-01-07: qty 28, 28d supply, fill #0

## 2024-01-07 MED ORDER — GUAIFENESIN ER 600 MG PO TB12
600.0000 mg | ORAL_TABLET | Freq: Two times a day (BID) | ORAL | 0 refills | Status: AC
  Filled 2024-01-07: qty 14, 7d supply, fill #0

## 2024-01-07 MED ORDER — AMOXICILLIN 500 MG PO CAPS
500.0000 mg | ORAL_CAPSULE | Freq: Three times a day (TID) | ORAL | 0 refills | Status: AC
  Filled 2024-01-07: qty 12, 4d supply, fill #0

## 2024-01-07 MED ORDER — CYCLOBENZAPRINE HCL 5 MG PO TABS
5.0000 mg | ORAL_TABLET | Freq: Three times a day (TID) | ORAL | 0 refills | Status: AC | PRN
  Filled 2024-01-07: qty 10, 4d supply, fill #0
  Filled ????-??-??: fill #0

## 2024-01-07 NOTE — Discharge Summary (Signed)
 Physician Discharge Summary  Tracey Ho YNW:[id number] DOB: 17-Nov-1995 DOA: 01/02/2024  PCP: Pcp, No  Admit date: 01/02/2024 Discharge date: 01/07/2024  Admitted From: Home Disposition:  Home  Discharge Condition:Stable CODE STATUS:FULL Diet recommendation: Regular  Brief/Interim Summary: Patient is a 28 year old female with history of substance abuse, HSV, chronic  anemia, currently [redacted] weeks pregnant who presented with complaint of shortness of breath, cough.  Found to be hypoxemic on presentation, needing oxygen.  Respiratory viral panel showed rhino/enterovirus.  Treated for bronchitis.  Has been able to be weaned to room air  now.  Still having some productive cough but denies any shortness of breath or chest pain.  Medically stable for discharge home today with oral antibiotics  Following problems were addressed during the hospitalization:  Acute viral bronchitis: Presented with wheezing, shortness of breath.  Respiratory viral panel came to be positive for rhinovirus.  No history of asthma.  Chest x-ray did not show pneumonia.  Received steroids, bronchodilators, Mucinex.  Steroids discontinued.  Currently on oral antibiotic.  Continue Mucinex for cough   Acute hypoxic respiratory failure: Secondary to bronchitis.  Initially required oxygen.  Has been weaned to room air today.  Continue to monitor on room air today.  TTE showed EF of 70 to 75%, hyperdynamic left and right medical function.  Continue incentive spirometer at home   Iron deficiency anemia: Presented with hemoglobin in the range of 7.9.  Given 2 doses of Feraheme.  Hemoglobin documented of 6.7 on 4/4, given 2 units of blood transfusion.  No evidence of acute blood loss   First trimester pregnancy: Currently 19 weeks.  OB/GYN were  following   Substance use disorder: History of chronic opiate dependence as well as tobacco use.  On Suboxone, nicotine patch   Hypokalemia: Supplemented and corrected     Discharge  Diagnoses:  Principal Problem:   Acute bronchitis Active Problems:   Shortness of breath   Reactive airway disease with acute exacerbation   Cannabis abuse   Opioid use disorder   Intrauterine pregnancy   Anemia of pregnancy   Iron deficiency anemia   Marijuana smoker   Dyspnea   Acute hypoxic respiratory failure (HCC)   SOB (shortness of breath)   Continuous dependence on cigarette smoking   Tobacco abuse    Discharge Instructions  Discharge Instructions     Diet general   Complete by: As directed    Discharge instructions   Complete by: As directed    1)Please take your medications as instructed 2)Follow up with your PCP/GYN as an outpatient   Increase activity slowly   Complete by: As directed       Allergies as of 01/07/2024       Reactions   Azithromycin Diarrhea, Nausea And Vomiting   Pt reports she has had azithromycin on multiple occasions with "severe vomiting and diarrhea and stomach cramps" always following administration        Medication List     STOP taking these medications    Aspirin Low Dose 81 MG tablet Generic drug: aspirin EC   ondansetron 4 MG disintegrating tablet Commonly known as: ZOFRAN-ODT       TAKE these medications    amoxicillin 500 MG capsule Commonly known as: AMOXIL Take 1 capsule (500 mg total) by mouth every 8 (eight) hours for 4 days. What changed: when to take this   Buprenorphine HCl-Naloxone HCl 8-2 MG Film Commonly known as: Suboxone Place 1 Film under the tongue 3 (three) times daily.  guaiFENesin 600 MG 12 hr tablet Commonly known as: MUCINEX Take 1 tablet (600 mg total) by mouth 2 (two) times daily for 7 days.   multivitamin-prenatal 27-0.8 MG Tabs tablet Take 1 tablet by mouth daily at 12 noon.   naloxone 4 MG/0.1ML Liqd nasal spray kit Commonly known as: NARCAN Use as needed to reverse opioid overdose   nicotine 14 mg/24hr patch Commonly known as: NICODERM CQ - dosed in mg/24 hours Place 1  patch (14 mg total) onto the skin daily.   ondansetron 4 MG tablet Commonly known as: Zofran Take 1 tablet (4 mg total) by mouth every 8 (eight) hours as needed for nausea or vomiting.   polyethylene glycol powder 17 GM/SCOOP powder Commonly known as: GLYCOLAX/MIRALAX Mix 17 grams in beverage and take by mouth daily.   valACYclovir 1000 MG tablet Commonly known as: VALTREX Take 1000 mg (1 tablet) every 12 hours for 7-10 days for first outbreak. For recurrences, take 1000 mg (1 tablet) daily for 3 days.        Follow-up Information     Levie Heritage, DO. Schedule an appointment as soon as possible for a visit in 1 week(s).   Specialty: Family Medicine Contact information: [address] [place] [address] [phone]                Allergies  Allergen Reactions   Azithromycin Diarrhea and Nausea And Vomiting    Pt reports she has had azithromycin on multiple occasions with "severe vomiting and diarrhea and stomach cramps" always following administration    Consultations: None   Procedures/Studies: ECHOCARDIOGRAM COMPLETE Result Date: 01/03/2024    ECHOCARDIOGRAM REPORT   Patient Name:   Tracey Ho Date of Exam: 01/03/2024 Medical Rec #:  [id number]    Height:       66.0 in Accession #:    [id number]   Weight:       197.2 lb Date of Birth:  02-18-1996    BSA:          1.988 m Patient Age:    27 years     BP:           108/68 mmHg Patient Gender: F            HR:           104 bpm. Exam Location:  Inpatient Procedure: 2D Echo, Color Doppler and Cardiac Doppler (Both Spectral and Color            Flow Doppler were utilized during procedure). Indications:    R06.9 DOE  History:        Patient has no prior history of Echocardiogram examinations.  Sonographer:    Irving Burton Senior RDCS Referring Phys: [id number] SUBRINA SUNDIL  Sonographer Comments: [redacted] weeks pregnant at time of study IMPRESSIONS  1. Left ventricular ejection fraction, by estimation, is 70 to 75%.  The left ventricle has hyperdynamic function. The left ventricle has no regional wall motion abnormalities. Indeterminate diastolic filling due to E-A fusion.  2. Right ventricular systolic function is hyperdynamic. The right ventricular size is normal. There is normal pulmonary artery systolic pressure. The estimated right ventricular systolic pressure is 26.6 mmHg.  3. The mitral valve is normal in structure. No evidence of mitral valve regurgitation.  4. The aortic valve is tricuspid. Aortic valve regurgitation is not visualized. No aortic stenosis is present.  5. The inferior vena cava is normal in size with greater than 50% respiratory variability, suggesting right  atrial pressure of 3 mmHg. Comparison(s): No prior Echocardiogram. FINDINGS  Left Ventricle: No 3D or strain transmitted. Left ventricular ejection fraction, by estimation, is 70 to 75%. The left ventricle has hyperdynamic function. The left ventricle has no regional wall motion abnormalities. Strain was performed and the global  longitudinal strain is indeterminate. The left ventricular internal cavity size was normal in size. There is no left ventricular hypertrophy. Indeterminate diastolic filling due to E-A fusion. Right Ventricle: The right ventricular size is normal. No increase in right ventricular wall thickness. Right ventricular systolic function is hyperdynamic. There is normal pulmonary artery systolic pressure. The tricuspid regurgitant velocity is 2.43 m/s, and with an assumed right atrial pressure of 3 mmHg, the estimated right ventricular systolic pressure is 26.6 mmHg. Left Atrium: Left atrial size was normal in size. Right Atrium: Right atrial size was normal in size. Pericardium: Trivial pericardial effusion is present. Mitral Valve: The mitral valve is normal in structure. No evidence of mitral valve regurgitation. Tricuspid Valve: The tricuspid valve is normal in structure. Tricuspid valve regurgitation is mild. Aortic Valve: The  aortic valve is tricuspid. Aortic valve regurgitation is not visualized. No aortic stenosis is present. Pulmonic Valve: The pulmonic valve was normal in structure. Pulmonic valve regurgitation is trivial. Aorta: The ascending aorta was not well visualized and the aortic root, ascending aorta and aortic arch are all structurally normal, with no evidence of dilitation or obstruction. Venous: The inferior vena cava is normal in size with greater than 50% respiratory variability, suggesting right atrial pressure of 3 mmHg. IAS/Shunts: No atrial level shunt detected by color flow Doppler. Additional Comments: 3D was performed not requiring image post processing on an independent workstation and was indeterminate.  LEFT VENTRICLE PLAX 2D LVIDd:         5.10 cm   Diastology LVIDs:         2.80 cm   LV e' medial: 13.30 cm/s LV PW:         0.80 cm LV IVS:        0.80 cm LVOT diam:     2.40 cm LV SV:         106 LV SV Index:   53 LVOT Area:     4.52 cm  RIGHT VENTRICLE RV S prime:     18.00 cm/s TAPSE (M-mode): 2.1 cm LEFT ATRIUM             Index        RIGHT ATRIUM           Index LA diam:        2.90 cm 1.46 cm/m   RA Area:     13.60 cm LA Vol (A2C):   33.3 ml 16.75 ml/m  RA Volume:   30.30 ml  15.24 ml/m LA Vol (A4C):   37.0 ml 18.61 ml/m LA Biplane Vol: 36.4 ml 18.31 ml/m  AORTIC VALVE LVOT Vmax:   127.00 cm/s LVOT Vmean:  90.500 cm/s LVOT VTI:    0.235 m  AORTA Ao Root diam: 2.90 cm Ao Asc diam:  2.80 cm TRICUSPID VALVE TR Peak grad:   23.6 mmHg TR Vmax:        243.00 cm/s  SHUNTS Systemic VTI:  0.24 m Systemic Diam: 2.40 cm Gloriann Larger MD Electronically signed by Gloriann Larger MD Signature Date/Time: 01/03/2024/1:22:06 PM    Final    DG CHEST PORT 1 VIEW Result Date: 01/02/2024 CLINICAL DATA:  Cough, wheezing EXAM: PORTABLE CHEST 1 VIEW COMPARISON:  None Available. FINDINGS: The heart size and mediastinal contours are within normal limits. Both lungs are clear. The visualized skeletal  structures are unremarkable. IMPRESSION: No active disease. Electronically Signed   By: Helyn Numbers M.D.   On: 01/02/2024 21:12      Subjective: Patient seen and examined at bedside today.  Hemodynamically stable.  Lying on bed.  On room air since yesterday.  Denies any worsening shortness of breath.  Still has some cough.  Lungs are clear auscultation.  Medically stable for discharge.  Discharge Exam: Vitals:   01/07/24 0558 01/07/24 0835  BP: (!) 108/45   Pulse: 88   Resp: 17 18  Temp: 98.3 F (36.8 C)   SpO2: 94%    Vitals:   01/06/24 1700 01/06/24 1959 01/07/24 0558 01/07/24 0835  BP:  114/62 (!) 108/45   Pulse:  78 88   Resp:  18 17 18   Temp:  98.3 F (36.8 C) 98.3 F (36.8 C)   TempSrc:  Oral Oral   SpO2: 94% 94% 94%   Weight:      Height:        General: Pt is alert, awake, not in acute distress Cardiovascular: RRR, S1/S2 +, no rubs, no gallops Respiratory: CTA bilaterally, no wheezing, no rhonchi Abdominal: Soft, NT,  bowel sounds + Extremities: no edema, no cyanosis    The results of significant diagnostics from this hospitalization (including imaging, microbiology, ancillary and laboratory) are listed below for reference.     Microbiology: Recent Results (from the past 240 hours)  Respiratory (~20 pathogens) panel by PCR     Status: Abnormal   Collection Time: 01/02/24  7:42 PM   Specimen: Nasopharyngeal Swab; Respiratory  Result Value Ref Range Status   Adenovirus NOT DETECTED NOT DETECTED Final   Coronavirus 229E NOT DETECTED NOT DETECTED Final    Comment: (NOTE) The Coronavirus on the Respiratory Panel, DOES NOT test for the novel  Coronavirus (2019 nCoV)    Coronavirus HKU1 NOT DETECTED NOT DETECTED Final   Coronavirus NL63 NOT DETECTED NOT DETECTED Final   Coronavirus OC43 NOT DETECTED NOT DETECTED Final   Metapneumovirus NOT DETECTED NOT DETECTED Final   Rhinovirus / Enterovirus DETECTED (A) NOT DETECTED Final   Influenza A NOT DETECTED  NOT DETECTED Final   Influenza B NOT DETECTED NOT DETECTED Final   Parainfluenza Virus 1 NOT DETECTED NOT DETECTED Final   Parainfluenza Virus 2 NOT DETECTED NOT DETECTED Final   Parainfluenza Virus 3 NOT DETECTED NOT DETECTED Final   Parainfluenza Virus 4 NOT DETECTED NOT DETECTED Final   Respiratory Syncytial Virus NOT DETECTED NOT DETECTED Final   Bordetella pertussis NOT DETECTED NOT DETECTED Final   Bordetella Parapertussis NOT DETECTED NOT DETECTED Final   Chlamydophila pneumoniae NOT DETECTED NOT DETECTED Final   Mycoplasma pneumoniae NOT DETECTED NOT DETECTED Final    Comment: Performed at [facility], [address] [place], [place] [address]     Labs: BNP (last 3 results) Recent Labs    01/02/24 2107  BNP 7.4   Basic Metabolic Panel: Recent Labs  Lab 01/02/24 2107 01/03/24 0751 01/05/24 0506 01/07/24 0700  NA 135 135 137 137  K 3.2* 4.1 3.9 3.9  CL 105 107 108 105  CO2 19* 19* 23 23  GLUCOSE 102* 121* 96 79  BUN 5* 5* 5* 9  CREATININE 0.64 0.59 0.47 0.59  CALCIUM 8.4* 8.3* 8.4* 8.9   Liver Function Tests: Recent Labs  Lab 01/02/24 2107 01/03/24  0751  AST 12* 11*  ALT 8 8  ALKPHOS 61 60  BILITOT 1.2 1.2  PROT 6.7 6.5  ALBUMIN 2.7* 2.6*   No results for input(s): "LIPASE", "AMYLASE" in the last 168 hours. No results for input(s): "AMMONIA" in the last 168 hours. CBC: Recent Labs  Lab 01/02/24 2107 01/03/24 0751 01/05/24 0506 01/05/24 2328 01/06/24 0515 01/07/24 0700  WBC 4.3 5.2 5.6 8.1 7.6 8.6  NEUTROABS 3.2  --   --   --   --   --   HGB 7.9* 7.7* 6.7* 8.3* 8.3* 9.2*  HCT 26.8* 25.8* 22.4* 27.3* 27.5* 31.0*  MCV 76.4* 75.9* 77.5* 78.9* 79.3* 81.4  PLT 195 231 221 248 248 270   Cardiac Enzymes: No results for input(s): "CKTOTAL", "CKMB", "CKMBINDEX", "TROPONINI" in the last 168 hours. BNP: Invalid input(s): "POCBNP" CBG: No results for input(s): "GLUCAP" in the last 168 hours. D-Dimer No results for input(s): "DDIMER" in the  last 72 hours. Hgb A1c No results for input(s): "HGBA1C" in the last 72 hours. Lipid Profile No results for input(s): "CHOL", "HDL", "LDLCALC", "TRIG", "CHOLHDL", "LDLDIRECT" in the last 72 hours. Thyroid function studies No results for input(s): "TSH", "T4TOTAL", "T3FREE", "THYROIDAB" in the last 72 hours.  Invalid input(s): "FREET3" Anemia work up No results for input(s): "VITAMINB12", "FOLATE", "FERRITIN", "TIBC", "IRON", "RETICCTPCT" in the last 72 hours. Urinalysis    Component Value Date/Time   COLORURINE YELLOW 11/12/2023 1005   APPEARANCEUR HAZY (A) 11/12/2023 1005   LABSPEC 1.017 11/12/2023 1005   PHURINE 5.0 11/12/2023 1005   GLUCOSEU NEGATIVE 11/12/2023 1005   HGBUR NEGATIVE 11/12/2023 1005   BILIRUBINUR NEGATIVE 11/12/2023 1005   KETONESUR 5 (A) 11/12/2023 1005   PROTEINUR NEGATIVE 11/12/2023 1005   UROBILINOGEN 0.2 02/18/2023 1337   NITRITE NEGATIVE 11/12/2023 1005   LEUKOCYTESUR MODERATE (A) 11/12/2023 1005   Sepsis Labs Recent Labs  Lab 01/05/24 0506 01/05/24 2328 01/06/24 0515 01/07/24 0700  WBC 5.6 8.1 7.6 8.6   Microbiology Recent Results (from the past 240 hours)  Respiratory (~20 pathogens) panel by PCR     Status: Abnormal   Collection Time: 01/02/24  7:42 PM   Specimen: Nasopharyngeal Swab; Respiratory  Result Value Ref Range Status   Adenovirus NOT DETECTED NOT DETECTED Final   Coronavirus 229E NOT DETECTED NOT DETECTED Final    Comment: (NOTE) The Coronavirus on the Respiratory Panel, DOES NOT test for the novel  Coronavirus (2019 nCoV)    Coronavirus HKU1 NOT DETECTED NOT DETECTED Final   Coronavirus NL63 NOT DETECTED NOT DETECTED Final   Coronavirus OC43 NOT DETECTED NOT DETECTED Final   Metapneumovirus NOT DETECTED NOT DETECTED Final   Rhinovirus / Enterovirus DETECTED (A) NOT DETECTED Final   Influenza A NOT DETECTED NOT DETECTED Final   Influenza B NOT DETECTED NOT DETECTED Final   Parainfluenza Virus 1 NOT DETECTED NOT DETECTED  Final   Parainfluenza Virus 2 NOT DETECTED NOT DETECTED Final   Parainfluenza Virus 3 NOT DETECTED NOT DETECTED Final   Parainfluenza Virus 4 NOT DETECTED NOT DETECTED Final   Respiratory Syncytial Virus NOT DETECTED NOT DETECTED Final   Bordetella pertussis NOT DETECTED NOT DETECTED Final   Bordetella Parapertussis NOT DETECTED NOT DETECTED Final   Chlamydophila pneumoniae NOT DETECTED NOT DETECTED Final   Mycoplasma pneumoniae NOT DETECTED NOT DETECTED Final    Comment: Performed at [facility], [address], [place], [place] [address]    Please note: You were cared for by a hospitalist during  your hospital stay. Once you are discharged, your primary care physician will handle any further medical issues. Please note that NO REFILLS for any discharge medications will be authorized once you are discharged, as it is imperative that you return to your primary care physician (or establish a relationship with a primary care physician if you do not have one) for your post hospital discharge needs so that they can reassess your need for medications and monitor your lab values.    Time coordinating discharge: 40 minutes  SIGNED:   Leona Rake, MD  [facility] 01/07/2024, 10:37 AM Pager [phone]  If 7PM-7AM, please contact night-coverage [email or web address] Password [email or web address]

## 2024-01-07 NOTE — Progress Notes (Unsigned)
 Called back, left message

## 2024-01-07 NOTE — Progress Notes (Signed)
 FACULTY PRACTICE ANTEPARTUM(COMPREHENSIVE) NOTE  Tracey Ho is a 28 y.o. [id number] with Estimated Date of Delivery: 05/28/24   By  early ultrasound [redacted]w[redacted]d  who is admitted for respiratory infection with hypoxia.     Length of Stay:  5  Days  Date of admission:01/02/2024  Subjective: Patient notes that she is feeling much better today and ready to go home.  She denies shortness of breath or chest pain.  She is still having some low back pain but does report that the Flexeril is helping.  She reports no acute complaints overnight Patient reports the fetal movement as some movement Patient reports uterine contraction  activity as none. Patient reports  vaginal bleeding as none. Patient describes fluid per vagina as None.  Vitals:  Blood pressure 124/71, pulse 97, temperature 98.4 F (36.9 C), temperature source Oral, resp. rate 18, height 5\' 6"  (1.676 m), weight 89.4 kg, last menstrual period 08/19/2023, SpO2 95%, not currently breastfeeding. Vitals:   01/06/24 1959 01/07/24 0558 01/07/24 0835 01/07/24 1047  BP: 114/62 (!) 108/45  124/71  Pulse: 78 88  97  Resp: 18 17 18 18   Temp: 98.3 F (36.8 C) 98.3 F (36.8 C)  98.4 F (36.9 C)  TempSrc: Oral Oral  Oral  SpO2: 94% 94%  95%  Weight:      Height:       Physical Examination:  General appearance - alert, well appearing, and in no distress Mental status - normal mood, behavior, speech, dress, motor activity, and thought processes Chest - CTAB Heart - normal rate and regular rhythm Abdomen - gravid- @ umbilicus, no rebound, no guarding, soft and non-tender Musculoskeletal - no edema, no calf tenderness bilaterally Extremities - no edema Skin - warm and dry   Fetal Monitoring:  158 by doppler  Labs:  Results for orders placed or performed during the hospital encounter of 01/02/24 (from the past 24 hours)  CBC   Collection Time: 01/07/24  7:00 AM  Result Value Ref Range   WBC 8.6 4.0 - 10.5 K/uL   RBC 3.81 (L) 3.87 - 5.11 MIL/uL    Hemoglobin 9.2 (L) 12.0 - 15.0 g/dL   HCT 45.4 (L) 09.8 - 11.9 %   MCV 81.4 80.0 - 100.0 fL   MCH 24.1 (L) 26.0 - 34.0 pg   MCHC 29.7 (L) 30.0 - 36.0 g/dL   RDW 14.7 (H) 82.9 - 56.2 %   Platelets 270 150 - 400 K/uL   nRBC 0.8 (H) 0.0 - 0.2 %  Basic metabolic panel with GFR   Collection Time: 01/07/24  7:00 AM  Result Value Ref Range   Sodium 137 135 - 145 mmol/L   Potassium 3.9 3.5 - 5.1 mmol/L   Chloride 105 98 - 111 mmol/L   CO2 23 22 - 32 mmol/L   Glucose, Bld 79 70 - 99 mg/dL   BUN 9 6 - 20 mg/dL   Creatinine, Ser 1.30 0.44 - 1.00 mg/dL   Calcium 8.9 8.9 - 86.5 mg/dL   GFR, Estimated >78 >46 mL/min   Anion gap 9 5 - 15    Imaging Studies:    No results found.   Medications:  Scheduled  amoxicillin  500 mg Oral Q8H   buprenorphine-naloxone  1 tablet Sublingual TID   fluticasone  2 spray Each Nare Daily   guaiFENesin  600 mg Oral BID   ipratropium-albuterol  3 mL Nebulization Q6H   nicotine  14 mg Transdermal Daily   polyethylene glycol  17 g Oral Daily   prenatal multivitamin  1 tablet Oral Q1200   sodium chloride flush  3 mL Intravenous Q12H   valACYclovir  1,000 mg Oral BID   I have reviewed the patient's current medications.  ASSESSMENT: [id number] [redacted]w[redacted]d Estimated Date of Delivery: 05/28/24  Patient Active Problem List   Diagnosis Date Noted   Acute bronchitis 01/04/2024   Tobacco abuse 01/03/2024   Shortness of breath 01/02/2024   Marijuana smoker 01/02/2024   Dyspnea 01/02/2024   Acute hypoxic respiratory failure (HCC) 01/02/2024   SOB (shortness of breath) 01/02/2024   Continuous dependence on cigarette smoking 01/02/2024   Reactive airway disease with acute exacerbation 01/02/2024   Iron deficiency anemia 12/09/2023   Maternal drug use complicating pregnancy, antepartum 12/09/2023   Oral thrush 12/04/2023   Chlamydia trachomatis infection in mother during second trimester of pregnancy 12/01/2023   Anemia of pregnancy 12/01/2023   Intrauterine  pregnancy 11/18/2023   Hidradenitis suppurativa 03/25/2023   Anxiety 02/18/2023   History of gestational hypertension 10/17/2022   Unwanted fertility 05/02/2022   Opioid use disorder 03/27/2022   History of gestational diabetes 01/11/2022   Genital warts 04/16/2021   Recurrent genital herpes 10/22/2020   Migraine without aura and without status migrainosus, not intractable 10/28/2017   BMI 30.0-30.9,adult 11/20/2015   History of suicide attempt 10/09/2012   History of self mutilation 10/09/2012   MDD (major depressive disorder), single episode, moderate (HCC) 08/04/2012   ADHD (attention deficit hyperactivity disorder), combined type 08/04/2012   Cannabis abuse 08/04/2012    PLAN: 1) Acute bronchitis -much improved on RA -appreciate recs per primary team  2) Anemia in preg -will plan to continue oral iron daily and follow as outpatient  3) OUD, tobacco use -continue patch and suboxone TID  4) Maternal/fetal care -Back pain, suspect musculoskeletal.  Hopeful that this will improve overtime.  Ok to continue with flexeril and tylenol prn -Treated for Chlamydia, TOC as outpatient in @ 2wks -continue valtrex  Pt to follow up as scheduled for outpatient OB appointments   Tracey Ho M Tracey Ho 01/07/2024,12:44 PM

## 2024-01-08 ENCOUNTER — Other Ambulatory Visit: Payer: Self-pay

## 2024-01-08 ENCOUNTER — Encounter: Payer: Self-pay | Admitting: Family Medicine

## 2024-01-08 ENCOUNTER — Ambulatory Visit: Payer: Self-pay

## 2024-01-08 NOTE — H&P (Signed)
 ------------------------------------------------------------------------------- Attestation with edits by Comer Earnie Nicholaus Inga, MD at 01/09/2024  6:34 AM I reviewed the information provided and agree with the treatment plan documented.  Comer CHRISTELLA Nicholaus, MD   -------------------------------------------------------------------------------  History & Physical   Service: Obstetrics  Chief Complaint: headache, nausea  Clinic: [facility]  Diagnoses: Intrauterine pregnancy OUD Anemia Hx GHTN Anxiety Chlamydia HSV  Gestational Age: [redacted]w[redacted]d Dating Criteria: LMP consistent with 7 week US    Assessment & Plan    Tracey Ho is a 28 y.o. [phone] being treated for:    #Migraine, N/V - reglan  and benadryl , IV mag, triptan, flexeril  and tylenol  given with improvement in HA to mild - passed PO challenge - Counseled patient that we  do not recommend taking ibuprofen  during pregnancy.  - Next app per patient- 4/21 - short course of flexeril  sent on DC  #Fetal Wellbeing  -Fetal Monitoring: Doppler tones present,168  Disposition: Home in stable condition  Subjective    Tracey Ho presents with headache & n/v. PMH significant for substance abuse, HSV, chronic anemia, & migraines. She was admitted to [facility] 4/11-4/16 for acute hypoxic respiratory failure secondary to bronchitis. During her admission she received 2 doses of feraheme & 2 units of PRBCs. Hemoglobin prior to discharge was 9.2.  She was discharged yesterday morning on amoxicillin .  Reports headache & vomiting started once she got home from the hospital yesterday. Rates headache 9/10. Describes throbbing pain throughout right side of head that is worse with movement & lights. She reports history of migraines that she normally treats with tylenol  & ibuprofen . Has been alternating excedrin , tylenol , & ibuprofen  for current headache. Last took medication at 7 pm, ibuprofen . States she hasn't been able to  keep down any food today due to vomiting. Took zofran  at 8 pm. Denies fever/chills, diarrhea, dysuria, abdominal pain, vaginal bleeding, or LOF. Reports respiratory symptoms have improved since being discharged yesterday.    Review of Systems: A complete review of systems was performed with pertinent positives as above.   Objective   Temp:  [98.1 F (36.7 C)-98.2 F (36.8 C)] 98.2 F (36.8 C) Heart Rate:  [88-111] 88 Resp:  [20] 20 BP: (93-119)/(51-74) 93/51   Physical Exam: GENERAL: Alert, No acute distress CHEST: No increased work of breathing CV: Regular rate ABDOMEN: Gravid, soft, nontender EXTREMITIES:  Warm and well-perfused, nontender, nonedematous NEURO: CN II-XII grossly intact  Obstetric ultrasounds reviewed in Epic.  Lab results and other imaging reviewed in Epic.  History   OB History  Gravida Para Term Preterm AB Living  6 2 2  0 2 2  SAB IAB Ectopic Molar Multiple Live Births  2 0 0 0  2    # Outcome Date GA Lbr Len/2nd Weight Sex Type Anes PTL Lv  6 Current           5 Term 07/12/16 [redacted]w[redacted]d   F Vag-Spont   LIV  4 Term 07/13/15 [redacted]w[redacted]d   F Vag-Spont   LIV  3 Gravida           2 SAB           1 SAB             Past Medical History:  Diagnosis Date  . Anemia   . Attention deficit disorder with hyperactivity(314.01)   . Depression   . Herpes   . 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 Laceration of left forearm 04/09/2015  . Migraine  Past Surgical History:  Procedure Laterality Date  . HAND SURGERY     Procedure: HAND SURGERY  . NERVE REPAIR Left 04/14/2015   Procedure: NERVE REPAIR LEFT FOREARM ;  Surgeon: Morene Levander Gavel, MD;  Location: [facility] OUTPATIENT OR;  Service: Orthopedics;  Laterality: Left;  . NO PAST SURGERIES     Procedure: NO PAST SURGERIES    Coding for today's visit was reached by Medical Decision Making (MDM).  I have personally seen and evaluated the patient. I agree with Tracey Ho's  assessment and plan. Edits to the above note were made where appropriate. Plan of care was discussed with attending, Dr. Nicholaus Sherryle Greaves, MD PGY-2 Obstetrics and Gynecology [facility] 01/09/2024 3:11 AM

## 2024-01-09 ENCOUNTER — Other Ambulatory Visit (HOSPITAL_COMMUNITY): Payer: Self-pay

## 2024-01-14 ENCOUNTER — Ambulatory Visit: Payer: Self-pay

## 2024-01-19 ENCOUNTER — Other Ambulatory Visit (HOSPITAL_COMMUNITY): Payer: Self-pay

## 2024-01-20 ENCOUNTER — Encounter: Payer: Self-pay | Admitting: Family Medicine

## 2024-01-20 ENCOUNTER — Other Ambulatory Visit (HOSPITAL_COMMUNITY): Payer: Self-pay

## 2024-01-22 ENCOUNTER — Ambulatory Visit: Payer: Self-pay

## 2024-01-29 ENCOUNTER — Ambulatory Visit (INDEPENDENT_AMBULATORY_CARE_PROVIDER_SITE_OTHER): Payer: MEDICAID

## 2024-01-29 VITALS — BP 118/79 | HR 67 | Temp 98.8°F | Resp 14 | Ht 66.0 in | Wt 201.6 lb

## 2024-01-29 DIAGNOSIS — O99012 Anemia complicating pregnancy, second trimester: Secondary | ICD-10-CM

## 2024-01-29 DIAGNOSIS — D508 Other iron deficiency anemias: Secondary | ICD-10-CM

## 2024-01-29 DIAGNOSIS — D509 Iron deficiency anemia, unspecified: Secondary | ICD-10-CM

## 2024-01-29 DIAGNOSIS — O99019 Anemia complicating pregnancy, unspecified trimester: Secondary | ICD-10-CM

## 2024-01-29 DIAGNOSIS — Z3A22 22 weeks gestation of pregnancy: Secondary | ICD-10-CM

## 2024-01-29 MED ORDER — SODIUM CHLORIDE 0.9 % IV SOLN
500.0000 mg | Freq: Once | INTRAVENOUS | Status: AC
  Administered 2024-01-29: 500 mg via INTRAVENOUS
  Filled 2024-01-29: qty 25

## 2024-01-29 NOTE — Progress Notes (Signed)
 Diagnosis: Iron Deficiency Anemia  Provider:  Praveen Mannam MD  Procedure: IV Infusion  IV Type: Peripheral, IV Location: L Antecubital  Venofer (Iron Sucrose), Dose: 500 mg  Infusion Start Time: 1111  Infusion Stop Time: 1531  Post Infusion IV Care: Observation period completed  Discharge: Condition: Good, Destination: Home . AVS Provided  Performed by:  Natividad Balding, RN

## 2024-01-30 ENCOUNTER — Encounter: Payer: Self-pay | Admitting: Family Medicine

## 2024-01-30 NOTE — Telephone Encounter (Signed)
 Patient now has medicaid and will not receive free drug.  Auth Submission: NO AUTH NEEDED Site of care: Site of care: [facility] Payer: [employer] medicaid Medication & CPT/J Code(s) submitted: Venofer  (Iron  Sucrose) J1756 Route of submission (phone, fax, portal):  Phone # Fax # Auth type: Buy/Bill PB Units/visits requested: 500mg  x 1 dose remaining Reference number:  Approval from: 01/30/24 to 03/31/24

## 2024-02-04 ENCOUNTER — Other Ambulatory Visit (HOSPITAL_COMMUNITY): Payer: Self-pay

## 2024-02-04 ENCOUNTER — Encounter: Payer: Self-pay | Admitting: Family Medicine

## 2024-02-05 ENCOUNTER — Other Ambulatory Visit: Payer: Self-pay

## 2024-02-05 ENCOUNTER — Other Ambulatory Visit (HOSPITAL_COMMUNITY): Payer: Self-pay

## 2024-02-06 ENCOUNTER — Other Ambulatory Visit (HOSPITAL_COMMUNITY): Payer: Self-pay

## 2024-02-12 ENCOUNTER — Encounter: Payer: Self-pay | Admitting: Family Medicine

## 2024-02-17 ENCOUNTER — Ambulatory Visit: Payer: MEDICAID

## 2024-02-20 ENCOUNTER — Ambulatory Visit: Payer: MEDICAID | Attending: Obstetrics and Gynecology | Admitting: Obstetrics and Gynecology

## 2024-02-20 ENCOUNTER — Other Ambulatory Visit: Payer: Self-pay | Admitting: Family Medicine

## 2024-02-20 ENCOUNTER — Ambulatory Visit: Payer: MEDICAID

## 2024-02-20 ENCOUNTER — Other Ambulatory Visit: Payer: Self-pay | Admitting: *Deleted

## 2024-02-20 VITALS — BP 113/65 | HR 81

## 2024-02-20 DIAGNOSIS — O0932 Supervision of pregnancy with insufficient antenatal care, second trimester: Secondary | ICD-10-CM | POA: Diagnosis not present

## 2024-02-20 DIAGNOSIS — Z363 Encounter for antenatal screening for malformations: Secondary | ICD-10-CM | POA: Insufficient documentation

## 2024-02-20 DIAGNOSIS — O99012 Anemia complicating pregnancy, second trimester: Secondary | ICD-10-CM | POA: Insufficient documentation

## 2024-02-20 DIAGNOSIS — F1120 Opioid dependence, uncomplicated: Secondary | ICD-10-CM | POA: Insufficient documentation

## 2024-02-20 DIAGNOSIS — D649 Anemia, unspecified: Secondary | ICD-10-CM | POA: Diagnosis not present

## 2024-02-20 DIAGNOSIS — O99322 Drug use complicating pregnancy, second trimester: Secondary | ICD-10-CM | POA: Insufficient documentation

## 2024-02-20 DIAGNOSIS — O99342 Other mental disorders complicating pregnancy, second trimester: Secondary | ICD-10-CM | POA: Diagnosis not present

## 2024-02-20 DIAGNOSIS — O0990 Supervision of high risk pregnancy, unspecified, unspecified trimester: Secondary | ICD-10-CM | POA: Diagnosis present

## 2024-02-20 DIAGNOSIS — F1290 Cannabis use, unspecified, uncomplicated: Secondary | ICD-10-CM

## 2024-02-20 DIAGNOSIS — Z8632 Personal history of gestational diabetes: Secondary | ICD-10-CM

## 2024-02-20 DIAGNOSIS — F17210 Nicotine dependence, cigarettes, uncomplicated: Secondary | ICD-10-CM

## 2024-02-20 DIAGNOSIS — Z3A26 26 weeks gestation of pregnancy: Secondary | ICD-10-CM | POA: Diagnosis not present

## 2024-02-20 DIAGNOSIS — O09292 Supervision of pregnancy with other poor reproductive or obstetric history, second trimester: Secondary | ICD-10-CM | POA: Insufficient documentation

## 2024-02-20 DIAGNOSIS — O99332 Smoking (tobacco) complicating pregnancy, second trimester: Secondary | ICD-10-CM | POA: Insufficient documentation

## 2024-02-20 DIAGNOSIS — O365920 Maternal care for other known or suspected poor fetal growth, second trimester, not applicable or unspecified: Secondary | ICD-10-CM

## 2024-02-20 DIAGNOSIS — O99212 Obesity complicating pregnancy, second trimester: Secondary | ICD-10-CM | POA: Diagnosis not present

## 2024-02-20 DIAGNOSIS — Z6830 Body mass index (BMI) 30.0-30.9, adult: Secondary | ICD-10-CM

## 2024-02-20 DIAGNOSIS — Z3490 Encounter for supervision of normal pregnancy, unspecified, unspecified trimester: Secondary | ICD-10-CM

## 2024-02-20 NOTE — Progress Notes (Signed)
 Maternal-Fetal Medicine Consultation Name: Tracey Ho MRN: [id number]  G6 [id number] at 26w 3d gestation.   - Inadequate prenatal care. Her pregnancy dated by LMP consistent with 7-week ultrasound measurements. -Suboxone  maintenance.  Patient takes sublingual Suboxone -naloxone .  She does not have withdrawal symptoms. -Cigarette smoking.  Patient smokes 4 to 5 cigarettes daily.  She was taking nicotine  replacement therapy (patches).  She did not feel any improvement and had discontinued. -Marijuana use. - Anemia.  Hemoglobin 11.1g/dL.  Patient patient admits that she is irregular and taking iron  supplements because of side effects. She had respiratory infections that had resolved.  Obstetric history significant for 3 term vaginal deliveries.  Ultrasound The estimated fetal weight is at the 3rd percentile and the abdominal circumference measurement at the 6th percentile.  Head circumference measurement is at between -2 1-1 SD.  Amniotic fluid is normal and good fetal activity seen.  Fetal anatomical survey appears normal.  No markers of aneuploidies or obvious fetal structural defects are seen. Umbilical artery Doppler showed normal forward diastolic flow.  Our concerns include: Fetal growth restriction I explained the finding of fetal growth restriction that is difficult to differentiate from a constitutionally small for gestational age fetus. She did not have fetal growth restriction in her previous pregnanciers. I discussed the possible causes of fetal growth restriction including placental insufficiency (most common cause), chromosomal anomalies and fetal infections.  Patient did not give history of fever or rashes. I explained that only amniocentesis will give a definitive result on the fetal karyotype and some genetic conditions (Microarray).  I explained amniocentesis procedure and possible complication of preterm delivery (1 and 500 procedures). Patient opted not to have amniocentesis. I  discussed ultrasound protocol of monitoring fetal growth restriction. Timing of delivery: We will recommend timing of delivery based on future fetal growth assessments.  Cigarette smoking Smoking increases the risk of maternal complications that include placental abruption and placenta previa. Fetal complications include fetal growth restriction, prematurity and stillbirth. Smoking cessation at any gestational age is beneficial and should routinely be addressed in her prenatal visits. Nicotine -replacement therapy (NRT) can be considered if nonpharmacological approaches have failed. Its chief benefit is that it does not contain other toxic substances present in cigarettes. Cigarettes, in addition to nicotine  and Carbon monoxide, contain several other toxic substances.  Cannabis I counseled the patient on marijuana use in pregnancy. Marijuana is the most-commonly used recreational drug in pregnancy. About 7% to 10% of pregnant women take cannabis. It has been used by some to treat anxiety, depression, nausea and vomiting. A small increase in preterm delivery and neonatal intensive care admissions. No increase in fetal congenital malformations were reported with cannabis. Fetal brain has THC receptor and long-term effects are not known. Concomitant use of tobacco or other drugs increase the risk of pregnancy adverse outcomes. If cannabis is used frequently, it may lead to persistent vomiting.  Some studies reported increase in stillbirth rates (absolute risk is very small) but that could be because of concomitant use of other recreational drugs or tobacco use. In consistent with ACOG recommendations, cannabis should be avoided in pregnancy.  Anemia in pregnancy I counseled the patient that iron  infusions are safe in pregnancy and should be considered if she is unable to tolerate oral iron .  Recommendations - UA Doppler and NST next week and in 2 weeks. - Fetal growth assessment in 3  weeks. Consultation including face-to-face (more than 50%) counseling 45 minutes.

## 2024-02-22 ENCOUNTER — Ambulatory Visit: Payer: Self-pay | Admitting: Family Medicine

## 2024-02-22 DIAGNOSIS — O365920 Maternal care for other known or suspected poor fetal growth, second trimester, not applicable or unspecified: Secondary | ICD-10-CM

## 2024-02-22 DIAGNOSIS — O0990 Supervision of high risk pregnancy, unspecified, unspecified trimester: Secondary | ICD-10-CM

## 2024-02-22 DIAGNOSIS — O365990 Maternal care for other known or suspected poor fetal growth, unspecified trimester, not applicable or unspecified: Secondary | ICD-10-CM | POA: Insufficient documentation

## 2024-02-23 ENCOUNTER — Other Ambulatory Visit (HOSPITAL_COMMUNITY): Payer: Self-pay

## 2024-03-02 ENCOUNTER — Ambulatory Visit: Payer: MEDICAID | Admitting: Family Medicine

## 2024-03-02 ENCOUNTER — Other Ambulatory Visit (HOSPITAL_COMMUNITY)
Admission: RE | Admit: 2024-03-02 | Discharge: 2024-03-02 | Disposition: A | Discharge status: Home / Self Care | Payer: MEDICAID | Source: Ambulatory Visit | Attending: Family Medicine | Admitting: Family Medicine

## 2024-03-02 ENCOUNTER — Other Ambulatory Visit: Payer: Self-pay

## 2024-03-02 ENCOUNTER — Other Ambulatory Visit (HOSPITAL_COMMUNITY): Payer: Self-pay

## 2024-03-02 VITALS — BP 117/73 | HR 76 | Wt 208.2 lb

## 2024-03-02 DIAGNOSIS — O99322 Drug use complicating pregnancy, second trimester: Secondary | ICD-10-CM | POA: Diagnosis not present

## 2024-03-02 DIAGNOSIS — F1120 Opioid dependence, uncomplicated: Secondary | ICD-10-CM | POA: Diagnosis not present

## 2024-03-02 DIAGNOSIS — O0990 Supervision of high risk pregnancy, unspecified, unspecified trimester: Secondary | ICD-10-CM

## 2024-03-02 DIAGNOSIS — Z3A27 27 weeks gestation of pregnancy: Secondary | ICD-10-CM

## 2024-03-02 DIAGNOSIS — O219 Vomiting of pregnancy, unspecified: Secondary | ICD-10-CM

## 2024-03-02 DIAGNOSIS — O365920 Maternal care for other known or suspected poor fetal growth, second trimester, not applicable or unspecified: Secondary | ICD-10-CM | POA: Diagnosis not present

## 2024-03-02 DIAGNOSIS — D509 Iron deficiency anemia, unspecified: Secondary | ICD-10-CM

## 2024-03-02 DIAGNOSIS — F1190 Opioid use, unspecified, uncomplicated: Secondary | ICD-10-CM

## 2024-03-02 DIAGNOSIS — O99320 Drug use complicating pregnancy, unspecified trimester: Secondary | ICD-10-CM

## 2024-03-02 DIAGNOSIS — O0992 Supervision of high risk pregnancy, unspecified, second trimester: Secondary | ICD-10-CM

## 2024-03-02 DIAGNOSIS — N898 Other specified noninflammatory disorders of vagina: Secondary | ICD-10-CM | POA: Diagnosis present

## 2024-03-02 DIAGNOSIS — Z6791 Unspecified blood type, Rh negative: Secondary | ICD-10-CM

## 2024-03-02 DIAGNOSIS — O98312 Other infections with a predominantly sexual mode of transmission complicating pregnancy, second trimester: Secondary | ICD-10-CM | POA: Insufficient documentation

## 2024-03-02 DIAGNOSIS — O26892 Other specified pregnancy related conditions, second trimester: Secondary | ICD-10-CM | POA: Diagnosis present

## 2024-03-02 DIAGNOSIS — A568 Sexually transmitted chlamydial infection of other sites: Secondary | ICD-10-CM

## 2024-03-02 MED ORDER — BUPRENORPHINE HCL-NALOXONE HCL 8-2 MG SL FILM
1.0000 | ORAL_FILM | Freq: Four times a day (QID) | SUBLINGUAL | 0 refills | Status: DC
  Filled 2024-03-02 – 2024-03-03 (×4): qty 120, 30d supply, fill #0

## 2024-03-02 MED ORDER — NALOXONE HCL 4 MG/0.1ML NA LIQD
NASAL | 11 refills | Status: AC
  Filled 2024-03-02: qty 2, 2d supply, fill #0

## 2024-03-02 MED ORDER — ONDANSETRON HCL 8 MG PO TABS
8.0000 mg | ORAL_TABLET | Freq: Three times a day (TID) | ORAL | 2 refills | Status: DC | PRN
  Filled 2024-03-02: qty 40, 14d supply, fill #0
  Filled 2024-03-29: qty 40, 14d supply, fill #1
  Filled 2024-04-08: qty 40, 14d supply, fill #2

## 2024-03-02 NOTE — Progress Notes (Signed)
 Subjective:  Tracey Ho is a 28 y.o. [phone] at [redacted]w[redacted]d being seen today for ongoing prenatal care.  She is currently monitored for the following issues for this high-risk pregnancy and has MDD (major depressive disorder), single episode, moderate (HCC); ADHD (attention deficit hyperactivity disorder), combined type; Cannabis abuse; Opioid use disorder; Unwanted fertility; Recurrent genital herpes; History of suicide attempt; History of gestational hypertension; Anxiety; Hidradenitis suppurativa; Supervision of high risk pregnancy, antepartum; History of gestational diabetes; Chlamydia trachomatis infection in mother during second trimester of pregnancy; Anemia of pregnancy; Oral thrush; Genital warts; Iron  deficiency anemia; Migraine without aura and without status migrainosus, not intractable; History of self mutilation; Maternal drug use complicating pregnancy, antepartum; BMI 30.0-30.9,adult; Shortness of breath; Marijuana smoker; Dyspnea; Acute hypoxic respiratory failure (HCC); SOB (shortness of breath); Continuous dependence on cigarette smoking; Reactive airway disease with acute exacerbation; Tobacco abuse; Acute bronchitis; and IUGR (intrauterine growth restriction) affecting care of mother on their problem list.  Patient reports vaginal discharge.  Contractions: Not present. Vag. Bleeding: None.  Movement: Present. Denies leaking of fluid.   The following portions of the patient's history were reviewed and updated as appropriate: allergies, current medications, past family history, past medical history, past social history, past surgical history and problem list. Problem list updated.  Objective:   Vitals:   03/02/24 1512  BP: 117/73  Pulse: 76  Weight: 208 lb 3.2 oz (94.4 kg)    Fetal Status: Fetal Heart Rate (bpm): 136   Movement: Present     General:  Alert, oriented and cooperative. Patient is in no acute distress.  Skin: Skin is warm and dry. No rash noted.   Cardiovascular:  Normal heart rate noted  Respiratory: Normal respiratory effort, no problems with respiration noted  Abdomen: Soft, gravid, appropriate for gestational age. Pain/Pressure: Absent     Pelvic: Vag. Bleeding: None Vag D/C Character: White   Cervical exam deferred        Extremities: Normal range of motion.  Edema: None  Mental Status: Normal mood and affect. Normal behavior. Normal judgment and thought content.   Urinalysis:      PDMP reviewed during this encounter.  Last UDS: Lab Results  Component Value Date   CREATIUR 189 12/09/2023     Assessment and Plan:  Pregnancy: [id number] at [redacted]w[redacted]d  1. Supervision of high risk pregnancy, antepartum (Primary)  2. Maternal drug use complicating pregnancy, antepartum  3. Poor fetal growth affecting management of mother in second trimester, single or unspecified fetus - Need to schedule antenatal testing 4. Iron  deficiency anemia, unspecified iron  deficiency anemia type - Second Venofer  6/11  5. [redacted] weeks gestation of pregnancy  6. Chlamydia trachomatis infection in mother during second trimester of pregnancy - Cervicovaginal ancillary only  7. Opioid use disorder - Buprenorphine  HCl-Naloxone  HCl (SUBOXONE ) 8-2 MG FILM; Place 1 Film under the tongue in the morning, at noon, in the evening, and at bedtime.  Dispense: 120 Film; Refill: 0 - naloxone  (NARCAN ) nasal spray 4 mg/0.1 mL; Use 1 spray in one nostril. If no response repeat in alternate nostril as needed to reverse opioid overdose. Call 911  Dispense: 2 each; Refill: 11  8. Vaginal discharge during pregnancy in second trimester - Cervicovaginal ancillary only  9. Suboxone  maintenance treatment complicating pregnancy, antepartum, second trimester (HCC) - ToxAssure  Preterm labor symptoms and general obstetric precautions including but not limited to vaginal bleeding, contractions, leaking of fluid and fetal movement were reviewed in detail with the patient. Please refer to After  Visit  Summary for other counseling recommendations.   Return in about 3 weeks (around 03/23/2024) for REACH ROB, TDaP and Rhophylac  03/23/24, 2 hour GTT, BPP 03/04/24 .   Future Appointments  Date Time Provider Department Center  03/03/2024  9:00 AM [facility] None  03/22/2024 11:00 AM [facility] PROVIDER 1 [facility]  03/22/2024 11:30 AM [facility] US4 [facility]    Total face-to-face time with patient: 15 minutes.  Over 50% of encounter was spent on counseling and coordination of care.   Marishka Rentfrow  Benard Brackett 03/02/2024 4:20 PM Center for [facility], [facility]

## 2024-03-02 NOTE — Progress Notes (Signed)
   Subjective:    Tracey Ho is a 28 y.o. [phone] here today for ongoing prenatal care and substance use management, substance exposed newborn care.  She reports overall feeling well but running out of her suboxone  strips early but cannot identify factors contributing to cravings and need for additional strips..  States that her sister will be her support person and ultimately be the physical guardian for this baby.  She will stay for the newborn hospitalization and will then transition care to her sister.  She verbalized concern over feeding this baby as her previous baby had feeding intolerance and "explosive diarrhea"-unsure if this was related to NOWS or true feeding intolerance.    Health Maintenance Due  Topic Date Due   HPV VACCINES (1 - 3-dose series) Never done   Pneumococcal Vaccine 28-74 Years old (1 of 2 - PCV) 06/12/2017    Past Medical History:  Diagnosis Date   ADHD (attention deficit hyperactivity disorder)    Anemia    Depression    Migraine headache    PID (acute pelvic inflammatory disease) 10/22/2020    Past Surgical History:  Procedure Laterality Date   NERVE SURGERY     NO PAST SURGERIES      The following portions of the patient's history were reviewed and updated as appropriate: allergies, current medications, past family history, past medical history, past social history, past surgical history and problem list.     Objective:   Bettye Bruins is quiet but well appearing and asking appropriate questions related to newborn stay and substance exposed newborn management.     Assessment and Plan:  At [facility] request, we reviewed substance exposed newborn stay and management to include Eat, Sleep and Console management.  We discussed formula feeding with a gentle formula that is lactose free.  Substance Exposed newborn pamphlet given with substance exposed newborn consult contact information if needs arise prior to next visit.  Otherwise, will follow at  next REACH visit. Problem List Items Addressed This Visit       Other   Supervision of high risk pregnancy, antepartum - Primary   Chlamydia trachomatis infection in mother during second trimester of pregnancy   Iron  deficiency anemia   Maternal drug use complicating pregnancy, antepartum   IUGR (intrauterine growth restriction) affecting care of mother   Other Visit Diagnoses       [redacted] weeks gestation of pregnancy           Routine preventative health maintenance measures emphasized. Please refer to After Visit Summary for other counseling recommendations.   No follow-ups on file.    Total face-to-face time with patient: 20 minutes.  Over 50% of encounter was spent on counseling and coordination of care.   Roxana Copier, NNP-BC Neonatal Nurse Practitioner Substance Exposed Newborn Consult at the [facility] [phone]

## 2024-03-02 NOTE — Patient Instructions (Signed)
 TDaP Vaccine Pregnancy Get the Whooping Cough Vaccine While You Are Pregnant (CDC)  It is important for women to get the whooping cough vaccine in the third trimester of each pregnancy. Vaccines are the best way to prevent this disease. There are 2 different whooping cough vaccines. Both vaccines combine protection against whooping cough, tetanus and diphtheria, but they are for different age groups: Tdap: for everyone 11 years or older, including pregnant women  DTaP: for children 2 months through 10 years of age  You need the whooping cough vaccine during each of your pregnancies The recommended time to get the shot is during your 27th through 36th week of pregnancy, preferably during the earlier part of this time period. The Centers for Disease Control and Prevention (CDC) recommends that pregnant women receive the whooping cough vaccine for adolescents and adults (called Tdap vaccine) during the third trimester of each pregnancy. The recommended time to get the shot is during your 27th through 36th week of pregnancy, preferably during the earlier part of this time period. This replaces the original recommendation that pregnant women get the vaccine only if they had not previously received it. The [employer] of [employer] and [employer]necologists and the [employer] support this recommendation.  You should get the whooping cough vaccine while pregnant to pass protection to your baby frame support disabled and/or not supported in this browser  Learn why Tracey Ho decided to get the whooping cough vaccine in her 3rd trimester of pregnancy and how her baby girl was born with some protection against the disease. Also available on YouTube. After receiving the whooping cough vaccine, your body will create protective antibodies (proteins produced by the body to fight off diseases) and pass some of them to your baby before birth. These antibodies provide your baby some short-term  protection against whooping cough in early life. These antibodies can also protect your baby from some of the more serious complications that come along with whooping cough. Your protective antibodies are at their highest about 2 weeks after getting the vaccine, but it takes time to pass them to your baby. So the preferred time to get the whooping cough vaccine is early in your third trimester. The amount of whooping cough antibodies in your body decreases over time. That is why CDC recommends you get a whooping cough vaccine during each pregnancy. Doing so allows each of your babies to get the greatest number of protective antibodies from you. This means each of your babies will get the best protection possible against this disease.  Getting the whooping cough vaccine while pregnant is better than getting the vaccine after you give birth Whooping cough vaccination during pregnancy is ideal so your baby will have short-term protection as soon as he is born. This early protection is important because your baby will not start getting his whooping cough vaccines until he is 2 months old. These first few months of life are when your baby is at greatest risk for catching whooping cough. This is also when he's at greatest risk for having severe, potentially life-threating complications from the infection. To avoid that gap in protection, it is best to get a whooping cough vaccine during pregnancy. You will then pass protection to your baby before he is born. To continue protecting your baby, he should get whooping cough vaccines starting at 2 months old. You may never have gotten the Tdap vaccine before and did not get it during this pregnancy. If so, you should make sure  to get the vaccine immediately after you give birth, before leaving the hospital or birthing center. It will take about 2 weeks before your body develops protection (antibodies) in response to the vaccine. Once you have protection from the vaccine,  you are less likely to give whooping cough to your newborn while caring for him. But remember, your baby will still be at risk for catching whooping cough from others. A recent study looked to see how effective Tdap was at preventing whooping cough in babies whose mothers got the vaccine while pregnant or in the hospital after giving birth. The study found that getting Tdap between 27 through 36 weeks of pregnancy is 85% more effective at preventing whooping cough in babies younger than 2 months old. Blood tests cannot tell if you need a whooping cough vaccine There are no blood tests that can tell you if you have enough antibodies in your body to protect yourself or your baby against whooping cough. Even if you have been sick with whooping cough in the past or previously received the vaccine, you still should get the vaccine during each pregnancy. Breastfeeding may pass some protective antibodies onto your baby By breastfeeding, you may pass some antibodies you have made in response to the vaccine to your baby. When you get a whooping cough vaccine during your pregnancy, you will have antibodies in your breast milk that you can share with your baby as soon as your milk comes in. However, your baby will not get protective antibodies immediately if you wait to get the whooping cough vaccine until after delivering your baby. This is because it takes about 2 weeks for your body to create antibodies. Learn more about the health benefits of breastfeeding.

## 2024-03-03 ENCOUNTER — Other Ambulatory Visit (HOSPITAL_COMMUNITY): Payer: Self-pay

## 2024-03-03 ENCOUNTER — Other Ambulatory Visit: Payer: Self-pay

## 2024-03-03 ENCOUNTER — Telehealth: Payer: Self-pay

## 2024-03-03 ENCOUNTER — Ambulatory Visit: Payer: MEDICAID

## 2024-03-03 DIAGNOSIS — O0990 Supervision of high risk pregnancy, unspecified, unspecified trimester: Secondary | ICD-10-CM

## 2024-03-03 LAB — CERVICOVAGINAL ANCILLARY ONLY
Bacterial Vaginitis (gardnerella): NEGATIVE
Candida Glabrata: NEGATIVE
Candida Vaginitis: NEGATIVE
Chlamydia: NEGATIVE
Comment: NEGATIVE
Comment: NEGATIVE
Comment: NEGATIVE
Comment: NEGATIVE
Comment: NEGATIVE
Comment: NORMAL
Neisseria Gonorrhea: NEGATIVE
Trichomonas: NEGATIVE

## 2024-03-03 NOTE — Addendum Note (Signed)
 Addended by: Felipe Horton, Morelia Cassells  on: 03/03/2024 10:38 PM   Modules accepted: Orders

## 2024-03-04 ENCOUNTER — Ambulatory Visit: Payer: Self-pay | Admitting: Advanced Practice Midwife

## 2024-03-04 ENCOUNTER — Other Ambulatory Visit: Payer: MEDICAID

## 2024-03-04 ENCOUNTER — Encounter: Payer: Self-pay | Admitting: Advanced Practice Midwife

## 2024-03-04 LAB — CBC
Hematocrit: 34.6 % (ref 34.0–46.6)
Hemoglobin: 10.8 g/dL — ABNORMAL LOW (ref 11.1–15.9)
MCH: 26.9 pg (ref 26.6–33.0)
MCHC: 31.2 g/dL — ABNORMAL LOW (ref 31.5–35.7)
MCV: 86 fL (ref 79–97)
Platelets: 192 10*3/uL (ref 150–450)
RBC: 4.01 x10E6/uL (ref 3.77–5.28)
RDW: 17.5 % — ABNORMAL HIGH (ref 11.7–15.4)
WBC: 4.2 10*3/uL (ref 3.4–10.8)

## 2024-03-04 LAB — HIV ANTIBODY (ROUTINE TESTING W REFLEX): HIV Screen 4th Generation wRfx: NONREACTIVE

## 2024-03-04 LAB — RPR: RPR Ser Ql: NONREACTIVE

## 2024-03-04 LAB — ANTIBODY SCREEN: Antibody Screen: NEGATIVE

## 2024-03-05 ENCOUNTER — Ambulatory Visit: Payer: MEDICAID | Attending: Obstetrics and Gynecology

## 2024-03-05 ENCOUNTER — Ambulatory Visit: Payer: MEDICAID | Admitting: *Deleted

## 2024-03-05 ENCOUNTER — Ambulatory Visit: Payer: MEDICAID | Admitting: Maternal & Fetal Medicine

## 2024-03-05 VITALS — BP 127/65 | HR 77

## 2024-03-05 DIAGNOSIS — O99013 Anemia complicating pregnancy, third trimester: Secondary | ICD-10-CM

## 2024-03-05 DIAGNOSIS — O0990 Supervision of high risk pregnancy, unspecified, unspecified trimester: Secondary | ICD-10-CM | POA: Insufficient documentation

## 2024-03-05 DIAGNOSIS — O365930 Maternal care for other known or suspected poor fetal growth, third trimester, not applicable or unspecified: Secondary | ICD-10-CM

## 2024-03-05 DIAGNOSIS — Z3A28 28 weeks gestation of pregnancy: Secondary | ICD-10-CM

## 2024-03-05 DIAGNOSIS — O365939 Maternal care for other known or suspected poor fetal growth, third trimester, other fetus: Secondary | ICD-10-CM | POA: Insufficient documentation

## 2024-03-05 DIAGNOSIS — O09293 Supervision of pregnancy with other poor reproductive or obstetric history, third trimester: Secondary | ICD-10-CM

## 2024-03-05 DIAGNOSIS — O99213 Obesity complicating pregnancy, third trimester: Secondary | ICD-10-CM

## 2024-03-05 DIAGNOSIS — O365920 Maternal care for other known or suspected poor fetal growth, second trimester, not applicable or unspecified: Secondary | ICD-10-CM | POA: Insufficient documentation

## 2024-03-05 DIAGNOSIS — O99333 Smoking (tobacco) complicating pregnancy, third trimester: Secondary | ICD-10-CM | POA: Diagnosis not present

## 2024-03-05 NOTE — Progress Notes (Signed)
 Patient information  Patient Name: Tracey Ho  Patient MRN:   [id number]  Referring practice: MFM Referring Provider: [facility])  Problem List   Patient Active Problem List   Diagnosis Date Noted   IUGR (intrauterine growth restriction) affecting care of mother 02/22/2024   Acute bronchitis 01/04/2024   Tobacco abuse 01/03/2024   Shortness of breath 01/02/2024   Marijuana smoker 01/02/2024   Dyspnea 01/02/2024   Acute hypoxic respiratory failure (HCC) 01/02/2024   SOB (shortness of breath) 01/02/2024   Continuous dependence on cigarette smoking 01/02/2024   Reactive airway disease with acute exacerbation 01/02/2024   Iron  deficiency anemia 12/09/2023   Maternal drug use complicating pregnancy, antepartum 12/09/2023   Oral thrush 12/04/2023   Chlamydia trachomatis infection in mother during second trimester of pregnancy 12/01/2023   Anemia of pregnancy 12/01/2023   Supervision of high risk pregnancy, antepartum 11/18/2023   Hidradenitis suppurativa 03/25/2023   Anxiety 02/18/2023   History of gestational hypertension 10/17/2022   Unwanted fertility 05/02/2022   Opioid use disorder 03/27/2022   History of gestational diabetes 01/11/2022   Genital warts 04/16/2021   Recurrent genital herpes 10/22/2020   Migraine without aura and without status migrainosus, not intractable 10/28/2017   BMI 30.0-30.9,adult 11/20/2015   History of suicide attempt 10/09/2012   History of self mutilation 10/09/2012   MDD (major depressive disorder), single episode, moderate (HCC) 08/04/2012   ADHD (attention deficit hyperactivity disorder), combined type 08/04/2012   Cannabis abuse 08/04/2012   Maternal Fetal medicine Consult  Tracey Ho is a 28 y.o. [id number] at [redacted]w[redacted]d here for ultrasound and consultation. Levora Paden is doing well today with no acute concerns. Today we focused on the following:   FGR: The patient has a growth restricted fetus measuring and is  here for umbilical artery Dopplers.  Everything appears normal on today's ultrasound.  Fetal growth will be done every 3 weeks.  I discussed the importance of assessing fetal movement which she reports is normal today.  Delivery timing will eventually be determined later in the pregnancy but likely around 37 weeks.  The patient had time to ask questions that were answered to her satisfaction.  She verbalized understanding and agrees to proceed with the plan below.  Sonographic findings Single intrauterine pregnancy at 28w 0d. Fetal cardiac activity:  Observed and appears normal. Presentation: Breech. Interval fetal anatomy appears normal. Amniotic fluid: Within normal limits.  MVP: 4.19 cm. Placenta: Posterior. Umbilical artery dopplers findings: -S/D:2.87 which are normal at this gestational age.  -Absent end-diastolic flow: No.  -Reversed end-diastolic flow:  No. NST was reassuring for gestational age.   Recommendations - Continue UA dopplers and antenatal testing as scheduled. - Serial growth US  every 3 weeks until delivery. - Delivery likely around 37 weeks or sooner if indicated.     Review of Systems: A review of systems was performed and was negative except per HPI   Vitals and Physical Exam    03/05/2024    9:40 AM 03/02/2024    3:12 PM 02/20/2024    9:18 AM  Vitals with BMI  Weight  208 lbs 3 oz   Systolic 127 117 147  Diastolic 65 73 65  Pulse 77 76 81    Sitting comfortably on the sonogram table Nonlabored breathing Normal rate and rhythm Abdomen is nontender  Past pregnancies OB History  Gravida Para Term Preterm AB Living  6 3 3  0 2 3  SAB IAB Ectopic Multiple Live  Births  2 0 0 0 3    # Outcome Date GA Lbr Len/2nd Weight Sex Type Anes PTL Lv  6 Current           5 Term 08/09/22 [redacted]w[redacted]d   M Vag-Spont   LIV  4 Term 2017     Vag-Spont   LIV  3 Term 2016     Vag-Spont   LIV     Birth Comments: System Generated. Please review and update pregnancy details.  2  SAB 2014          1 SAB 2014             I spent 20 minutes reviewing the patients chart, including labs and images as well as counseling the patient about her medical conditions. Greater than 50% of the time was spent in direct face-to-face patient counseling.  Penney Bowling  MFM, [facility]   03/05/2024  11:20 AM

## 2024-03-05 NOTE — Procedures (Signed)
 Tracey Ho 05-05-1996 [redacted]w[redacted]d  Fetus A Non-Stress Test Interpretation for 03/05/24  Indication: IUGR  Fetal Heart Rate A Mode: External Baseline Rate (A): 130 bpm Variability: Moderate Accelerations: 10 x 10 Multiple birth?: No  Uterine Activity Mode: Toco Contraction Frequency (min): none Resting Tone Palpated: Relaxed  Interpretation (Fetal Testing) Nonstress Test Interpretation: Reactive Comments: Tracing reviewed byDr. Nolan Battle

## 2024-03-05 NOTE — Progress Notes (Unsigned)
 Patient information  Patient Name: Tracey Ho  Patient MRN:   [id number]  Referring practice: MFM Referring Provider: [facility])  Problem List   Patient Active Problem List   Diagnosis Date Noted   IUGR (intrauterine growth restriction) affecting care of mother 02/22/2024   Acute bronchitis 01/04/2024   Tobacco abuse 01/03/2024   Shortness of breath 01/02/2024   Marijuana smoker 01/02/2024   Dyspnea 01/02/2024   Acute hypoxic respiratory failure (HCC) 01/02/2024   SOB (shortness of breath) 01/02/2024   Continuous dependence on cigarette smoking 01/02/2024   Reactive airway disease with acute exacerbation 01/02/2024   Iron  deficiency anemia 12/09/2023   Maternal drug use complicating pregnancy, antepartum 12/09/2023   Oral thrush 12/04/2023   Chlamydia trachomatis infection in mother during second trimester of pregnancy 12/01/2023   Anemia of pregnancy 12/01/2023   Supervision of high risk pregnancy, antepartum 11/18/2023   Hidradenitis suppurativa 03/25/2023   Anxiety 02/18/2023   History of gestational hypertension 10/17/2022   Unwanted fertility 05/02/2022   Opioid use disorder 03/27/2022   History of gestational diabetes 01/11/2022   Genital warts 04/16/2021   Recurrent genital herpes 10/22/2020   Migraine without aura and without status migrainosus, not intractable 10/28/2017   BMI 30.0-30.9,adult 11/20/2015   History of suicide attempt 10/09/2012   History of self mutilation 10/09/2012   MDD (major depressive disorder), single episode, moderate (HCC) 08/04/2012   ADHD (attention deficit hyperactivity disorder), combined type 08/04/2012   Cannabis abuse 08/04/2012    Maternal Fetal medicine Consult  Tracey Ho is a 28 y.o. [id number] at [redacted]w[redacted]d here for ultrasound and consultation. Tracey Ho is doing well today with no acute concerns. Today we focused on the following:   FGR: The patient has a growth restricted fetus measuring and is  here for umbilical artery Dopplers.  Everything appears normal on today's ultrasound.  Fetal growth will be done every 3 weeks.  I discussed the importance of assessing fetal movement which she reports is normal today.  Delivery timing will eventually be determined later in the pregnancy but likely around 37 weeks.   The patient had time to ask questions that were answered to her satisfaction.  She verbalized understanding and agrees to proceed with the plan below.  Sonographic findings Single intrauterine pregnancy at 28w 0d. Fetal cardiac activity:  Observed and appears normal. Presentation: Breech. Interval fetal anatomy appears normal. Amniotic fluid: Within normal limits.  MVP: 4.19 cm. Placenta: Posterior. Umbilical artery dopplers findings: -S/D:2.87 which are normal at this gestational age.  -Absent end-diastolic flow: No.  -Reversed end-diastolic flow:  No. NST was reassuring for gestational age.   Recommendations - Continue UA dopplers and antenatal testing as scheduled. - Serial growth US  every 3 weeks until delivery. - Delivery likely around 37 weeks or sooner if indicated.     Review of Systems: A review of systems was performed and was negative except per HPI   Vitals and Physical Exam    03/05/2024    9:40 AM 03/02/2024    3:12 PM 02/20/2024    9:18 AM  Vitals with BMI  Weight  208 lbs 3 oz   Systolic 127 117 147  Diastolic 65 73 65  Pulse 77 76 81    Sitting comfortably on the sonogram table Nonlabored breathing Normal rate and rhythm Abdomen is nontender  Past pregnancies OB History  Gravida Para Term Preterm AB Living  6 3 3  0 2 3  SAB IAB Ectopic  Multiple Live Births  2 0 0 0 3    # Outcome Date GA Lbr Len/2nd Weight Sex Type Anes PTL Lv  6 Current           5 Term 08/09/22 [redacted]w[redacted]d   M Vag-Spont   LIV  4 Term 2017     Vag-Spont   LIV  3 Term 2016     Vag-Spont   LIV     Birth Comments: System Generated. Please review and update pregnancy details.   2 SAB 2014          1 SAB 2014            I spent 20 minutes reviewing the patients chart, including labs and images as well as counseling the patient about her medical conditions. Greater than 50% of the time was spent in direct face-to-face patient counseling.  Tracey Ho  MFM, [facility]   03/05/2024  11:52 AM

## 2024-03-09 LAB — TOXASSURE FLEX 15, UR
6-ACETYLMORPHINE IA: NEGATIVE ng/mL
7-aminoclonazepam: NOT DETECTED ng/mg{creat}
AMPHETAMINES IA: NEGATIVE ng/mL
Alpha-hydroxyalprazolam: NOT DETECTED ng/mg{creat}
Alpha-hydroxymidazolam: NOT DETECTED ng/mg{creat}
Alpha-hydroxytriazolam: NOT DETECTED ng/mg{creat}
Alprazolam: NOT DETECTED ng/mg{creat}
BARBITURATES IA: NEGATIVE ng/mL
BUPRENORPHINE: POSITIVE
Benzodiazepines: NEGATIVE
Buprenorphine: 210 ng/mg{creat}
COCAINE METABOLITE IA: NEGATIVE ng/mL
Clonazepam: NOT DETECTED ng/mg{creat}
Creatinine: 149 mg/dL (ref >=20)
Desalkylflurazepam: NOT DETECTED ng/mg{creat}
Desmethyldiazepam: NOT DETECTED ng/mg{creat}
Desmethylflunitrazepam: NOT DETECTED ng/mg{creat}
Diazepam: NOT DETECTED ng/mg{creat}
ETHYL ALCOHOL Enzymatic: NEGATIVE g/dL
FENTANYL: NEGATIVE
Fentanyl: NOT DETECTED ng/mg{creat}
Flunitrazepam: NOT DETECTED ng/mg{creat}
Lorazepam: NOT DETECTED ng/mg{creat}
METHADONE IA: NEGATIVE ng/mL
METHADONE MTB IA: NEGATIVE ng/mL
Midazolam: NOT DETECTED ng/mg{creat}
Norbuprenorphine: >671 ng/mg{creat}
Norfentanyl: NOT DETECTED ng/mg{creat}
OPIATE CLASS IA: NEGATIVE ng/mL
OXYCODONE CLASS IA: NEGATIVE ng/mL
Oxazepam: NOT DETECTED ng/mg{creat}
PHENCYCLIDINE IA: NEGATIVE ng/mL
TAPENTADOL, IA: NEGATIVE ng/mL
TRAMADOL IA: NEGATIVE ng/mL
Temazepam: NOT DETECTED ng/mg{creat}

## 2024-03-09 LAB — CANNABINOIDS, MS, UR RFX
Cannabinoids Confirmation: POSITIVE
Carboxy-THC: 15 ng/mg{creat}

## 2024-03-17 ENCOUNTER — Ambulatory Visit: Payer: MEDICAID

## 2024-03-17 VITALS — BP 105/70 | HR 71 | Temp 97.9°F | Resp 16 | Ht 66.0 in | Wt 206.6 lb

## 2024-03-17 DIAGNOSIS — O99013 Anemia complicating pregnancy, third trimester: Secondary | ICD-10-CM

## 2024-03-17 DIAGNOSIS — D509 Iron deficiency anemia, unspecified: Secondary | ICD-10-CM

## 2024-03-17 DIAGNOSIS — Z3A29 29 weeks gestation of pregnancy: Secondary | ICD-10-CM

## 2024-03-17 DIAGNOSIS — D508 Other iron deficiency anemias: Secondary | ICD-10-CM

## 2024-03-17 DIAGNOSIS — O99019 Anemia complicating pregnancy, unspecified trimester: Secondary | ICD-10-CM

## 2024-03-17 MED ORDER — SODIUM CHLORIDE 0.9 % IV SOLN
500.0000 mg | Freq: Once | INTRAVENOUS | Status: AC
  Administered 2024-03-17: 500 mg via INTRAVENOUS
  Filled 2024-03-17: qty 25

## 2024-03-17 NOTE — Progress Notes (Signed)
 Diagnosis: Iron  Deficiency Anemia  Provider:  Praveen Mannam MD  Procedure: IV Infusion  IV Type: Peripheral, IV Location: R Antecubital  Venofer  (Iron  Sucrose), Dose: 500 mg  Infusion Start Time: 0926  Infusion Stop Time: 1318  Post Infusion IV Care: Observation period completed and Peripheral IV Discontinued  Discharge: Condition: Good, Destination: Home . AVS Declined  Performed by:  Maximiano JONELLE Pouch, LPN

## 2024-03-22 ENCOUNTER — Ambulatory Visit (HOSPITAL_BASED_OUTPATIENT_CLINIC_OR_DEPARTMENT_OTHER): Payer: MEDICAID

## 2024-03-22 ENCOUNTER — Ambulatory Visit (HOSPITAL_BASED_OUTPATIENT_CLINIC_OR_DEPARTMENT_OTHER): Payer: MEDICAID | Admitting: *Deleted

## 2024-03-22 ENCOUNTER — Other Ambulatory Visit: Payer: Self-pay | Admitting: *Deleted

## 2024-03-22 ENCOUNTER — Ambulatory Visit (HOSPITAL_BASED_OUTPATIENT_CLINIC_OR_DEPARTMENT_OTHER): Payer: MEDICAID | Attending: Obstetrics and Gynecology | Admitting: Obstetrics

## 2024-03-22 ENCOUNTER — Encounter: Payer: Self-pay | Admitting: Obstetrics and Gynecology

## 2024-03-22 ENCOUNTER — Other Ambulatory Visit: Payer: Self-pay | Admitting: Obstetrics and Gynecology

## 2024-03-22 VITALS — BP 122/70 | HR 75

## 2024-03-22 DIAGNOSIS — F1190 Opioid use, unspecified, uncomplicated: Secondary | ICD-10-CM | POA: Insufficient documentation

## 2024-03-22 DIAGNOSIS — D649 Anemia, unspecified: Secondary | ICD-10-CM | POA: Insufficient documentation

## 2024-03-22 DIAGNOSIS — O99213 Obesity complicating pregnancy, third trimester: Secondary | ICD-10-CM | POA: Insufficient documentation

## 2024-03-22 DIAGNOSIS — O09293 Supervision of pregnancy with other poor reproductive or obstetric history, third trimester: Secondary | ICD-10-CM | POA: Diagnosis not present

## 2024-03-22 DIAGNOSIS — Z362 Encounter for other antenatal screening follow-up: Secondary | ICD-10-CM | POA: Insufficient documentation

## 2024-03-22 DIAGNOSIS — O0990 Supervision of high risk pregnancy, unspecified, unspecified trimester: Secondary | ICD-10-CM

## 2024-03-22 DIAGNOSIS — O365930 Maternal care for other known or suspected poor fetal growth, third trimester, not applicable or unspecified: Secondary | ICD-10-CM

## 2024-03-22 DIAGNOSIS — O99333 Smoking (tobacco) complicating pregnancy, third trimester: Secondary | ICD-10-CM | POA: Insufficient documentation

## 2024-03-22 DIAGNOSIS — O365920 Maternal care for other known or suspected poor fetal growth, second trimester, not applicable or unspecified: Secondary | ICD-10-CM | POA: Diagnosis present

## 2024-03-22 DIAGNOSIS — O99013 Anemia complicating pregnancy, third trimester: Secondary | ICD-10-CM | POA: Diagnosis not present

## 2024-03-22 DIAGNOSIS — Z3A30 30 weeks gestation of pregnancy: Secondary | ICD-10-CM

## 2024-03-22 DIAGNOSIS — O99323 Drug use complicating pregnancy, third trimester: Secondary | ICD-10-CM | POA: Diagnosis not present

## 2024-03-22 NOTE — Progress Notes (Signed)
 MFM Consult Note  Tracey Ho is currently at 30 weeks and 3 days.  She has been followed due to IUGR.   She denies any problems since her last exam and reports feeling vigorous fetal movements throughout the day.    On today's exam, the EFW of 2 pounds 11 ounces measures at the 2nd percentile for her gestational age indicating IUGR.  The fetus has grown 1 pound over the past 4 weeks.    The total AFI was 14.76 cm (within normal limits).  Fetal movements were noted throughout today's exam.  She had a reactive NST today.  Doppler studies of the umbilical arteries showed a normal S/D ratio of 2.31 .  There were no signs of absent or reversed end-diastolic flow.    The patient was reassured that IUGR is a common finding.  Most cases of IUGR result in the delivery of a healthy infant at or close to term.  Due to fetal growth restriction, we will continue to follow her with weekly fetal testing and umbilical artery Doppler studies.    Should IUGR continue to be noted later in her pregnancy, delivery will be recommended at between 37 to 38 weeks.  She will return in 1 week for an NST and umbilical artery Doppler study.    The patient stated that all of her questions were answered today.  A total of 20 minutes was spent counseling and coordinating the care for this patient.  Greater than 50% of the time was spent in direct face-to-face contact.

## 2024-03-22 NOTE — Procedures (Signed)
 Tracey Ho 06/02/1996 [redacted]w[redacted]d  Fetus A Non-Stress Test Interpretation for 03/22/24  Indication: IUGR and Suboxone  use  Fetal Heart Rate A Mode: External Baseline Rate (A): 150 bpm Variability: Moderate Accelerations: 10 x 10 Decelerations: None Multiple birth?: No  Uterine Activity Mode: Palpation, Toco Contraction Frequency (min): None Resting Tone Palpated: Relaxed Resting Time: Adequate  Interpretation (Fetal Testing) Nonstress Test Interpretation: Reactive Overall Impression: Reassuring for gestational age Comments: Dr. Ileana reviewed tracing

## 2024-03-23 ENCOUNTER — Encounter: Payer: MEDICAID | Admitting: Advanced Practice Midwife

## 2024-03-29 ENCOUNTER — Encounter: Payer: Self-pay | Admitting: Family Medicine

## 2024-03-29 DIAGNOSIS — F1190 Opioid use, unspecified, uncomplicated: Secondary | ICD-10-CM

## 2024-03-31 ENCOUNTER — Ambulatory Visit (HOSPITAL_BASED_OUTPATIENT_CLINIC_OR_DEPARTMENT_OTHER): Payer: MEDICAID | Admitting: *Deleted

## 2024-03-31 ENCOUNTER — Other Ambulatory Visit (HOSPITAL_COMMUNITY): Payer: Self-pay

## 2024-03-31 ENCOUNTER — Other Ambulatory Visit: Payer: MEDICAID

## 2024-03-31 ENCOUNTER — Ambulatory Visit (HOSPITAL_BASED_OUTPATIENT_CLINIC_OR_DEPARTMENT_OTHER): Payer: MEDICAID | Admitting: Obstetrics

## 2024-03-31 ENCOUNTER — Other Ambulatory Visit: Payer: Self-pay | Admitting: Advanced Practice Midwife

## 2024-03-31 ENCOUNTER — Ambulatory Visit: Payer: MEDICAID | Attending: Obstetrics

## 2024-03-31 VITALS — BP 115/63 | HR 85

## 2024-03-31 DIAGNOSIS — O99213 Obesity complicating pregnancy, third trimester: Secondary | ICD-10-CM

## 2024-03-31 DIAGNOSIS — O99333 Smoking (tobacco) complicating pregnancy, third trimester: Secondary | ICD-10-CM | POA: Diagnosis not present

## 2024-03-31 DIAGNOSIS — O99323 Drug use complicating pregnancy, third trimester: Secondary | ICD-10-CM

## 2024-03-31 DIAGNOSIS — O365930 Maternal care for other known or suspected poor fetal growth, third trimester, not applicable or unspecified: Secondary | ICD-10-CM

## 2024-03-31 DIAGNOSIS — F1190 Opioid use, unspecified, uncomplicated: Secondary | ICD-10-CM

## 2024-03-31 DIAGNOSIS — F17200 Nicotine dependence, unspecified, uncomplicated: Secondary | ICD-10-CM | POA: Diagnosis not present

## 2024-03-31 DIAGNOSIS — Z3A31 31 weeks gestation of pregnancy: Secondary | ICD-10-CM | POA: Diagnosis present

## 2024-03-31 DIAGNOSIS — O0990 Supervision of high risk pregnancy, unspecified, unspecified trimester: Secondary | ICD-10-CM | POA: Insufficient documentation

## 2024-03-31 DIAGNOSIS — O09293 Supervision of pregnancy with other poor reproductive or obstetric history, third trimester: Secondary | ICD-10-CM

## 2024-03-31 DIAGNOSIS — E669 Obesity, unspecified: Secondary | ICD-10-CM

## 2024-03-31 MED ORDER — BUPRENORPHINE HCL-NALOXONE HCL 8-2 MG SL FILM
1.0000 | ORAL_FILM | Freq: Four times a day (QID) | SUBLINGUAL | 0 refills | Status: DC
  Filled 2024-03-31: qty 120, 30d supply, fill #0

## 2024-03-31 NOTE — Progress Notes (Signed)
 MFM Consult Note  Tracey Ho is currently at 31 weeks and 5 days.  She has been followed due to IUGR.    She denies any problems since her last exam and reports feeling fetal movements throughout the day.  She had a reactive NST for her gestational age today.    There was normal amniotic fluid noted with a total AFI of 12.46 cm.  Doppler studies of the umbilical arteries performed due to fetal growth restriction showed a normal S/D ratio of 2.11.  There were no signs of absent or reversed end-diastolic flow noted today.  She will return in 1 week for a BPP and umbilical artery Doppler study.     The patient stated that all of her questions were answered today.  A total of 10 minutes was spent counseling and coordinating the care for this patient.  Greater than 50% of the time was spent in direct face-to-face contact.

## 2024-03-31 NOTE — Procedures (Signed)
 Tracey Ho [birth year] [redacted]w[redacted]d  Fetus A Non-Stress Test Interpretation for 03/31/24  Indication: IUGR  Fetal Heart Rate A Mode: External Baseline Rate (A): 40 bpm Variability: Moderate Accelerations: 10 x 10 Multiple birth?: No  Uterine Activity Mode: Toco Contraction Frequency (min): none Resting Tone Palpated: Relaxed  Interpretation (Fetal Testing) Nonstress Test Interpretation: Reactive Comments: tracing reviewed byDr. Ileana

## 2024-04-04 LAB — TOXASSURE FLEX 15, UR
6-ACETYLMORPHINE IA: NEGATIVE ng/mL
7-aminoclonazepam: NOT DETECTED ng/mg{creat}
AMPHETAMINES IA: NEGATIVE ng/mL
Alpha-hydroxyalprazolam: NOT DETECTED ng/mg{creat}
Alpha-hydroxymidazolam: NOT DETECTED ng/mg{creat}
Alpha-hydroxytriazolam: NOT DETECTED ng/mg{creat}
Alprazolam: NOT DETECTED ng/mg{creat}
BARBITURATES IA: NEGATIVE ng/mL
BUPRENORPHINE: POSITIVE
Benzodiazepines: NEGATIVE
Buprenorphine: 90 ng/mg{creat}
COCAINE METABOLITE IA: NEGATIVE ng/mL
Clonazepam: NOT DETECTED ng/mg{creat}
Creatinine: 250 mg/dL (ref >=20)
Desalkylflurazepam: NOT DETECTED ng/mg{creat}
Desmethyldiazepam: NOT DETECTED ng/mg{creat}
Desmethylflunitrazepam: NOT DETECTED ng/mg{creat}
Diazepam: NOT DETECTED ng/mg{creat}
ETHYL ALCOHOL Enzymatic: NEGATIVE g/dL
FENTANYL: NEGATIVE
Fentanyl: NOT DETECTED ng/mg{creat}
Flunitrazepam: NOT DETECTED ng/mg{creat}
Lorazepam: NOT DETECTED ng/mg{creat}
METHADONE IA: NEGATIVE ng/mL
METHADONE MTB IA: NEGATIVE ng/mL
Midazolam: NOT DETECTED ng/mg{creat}
Norbuprenorphine: >400 ng/mg{creat}
Norfentanyl: NOT DETECTED ng/mg{creat}
Oxazepam: NOT DETECTED ng/mg{creat}
PHENCYCLIDINE IA: NEGATIVE ng/mL
TAPENTADOL, IA: NEGATIVE ng/mL
TRAMADOL IA: NEGATIVE ng/mL
Temazepam: NOT DETECTED ng/mg{creat}

## 2024-04-04 LAB — OPIATE CLASS, MS, UR RFX
Codeine: NOT DETECTED ng/mg{creat}
Dihydrocodeine: NOT DETECTED ng/mg{creat}
Hydrocodone: NOT DETECTED ng/mg{creat}
Hydromorphone: NOT DETECTED ng/mg{creat}
Morphine: NOT DETECTED ng/mg{creat}
Norcodeine: NOT DETECTED ng/mg{creat}
Norhydrocodone: NOT DETECTED ng/mg{creat}
Normorphine: NOT DETECTED ng/mg{creat}
Opiate Class Confirmation: NEGATIVE

## 2024-04-04 LAB — OXYCODONE CLASS, MS, UR RFX
Noroxycodone: >4000 ng/mg{creat}
Noroxymorphone: 1516 ng/mg{creat}
Oxycodone Class Confirmation: POSITIVE
Oxycodone: 2798 ng/mg{creat}
Oxymorphone: 2608 ng/mg{creat}

## 2024-04-04 LAB — CANNABINOIDS, MS, UR RFX
Cannabinoids Confirmation: POSITIVE
Carboxy-THC: 36 ng/mg{creat}

## 2024-04-05 ENCOUNTER — Ambulatory Visit: Payer: Self-pay | Admitting: Family Medicine

## 2024-04-07 ENCOUNTER — Ambulatory Visit: Payer: MEDICAID

## 2024-04-08 ENCOUNTER — Other Ambulatory Visit (HOSPITAL_COMMUNITY): Payer: Self-pay

## 2024-04-09 ENCOUNTER — Encounter: Payer: Self-pay | Admitting: *Deleted

## 2024-04-13 ENCOUNTER — Ambulatory Visit (INDEPENDENT_AMBULATORY_CARE_PROVIDER_SITE_OTHER): Payer: MEDICAID | Admitting: Family Medicine

## 2024-04-13 ENCOUNTER — Other Ambulatory Visit: Payer: Self-pay

## 2024-04-13 ENCOUNTER — Other Ambulatory Visit (HOSPITAL_COMMUNITY)
Admission: RE | Admit: 2024-04-13 | Discharge: 2024-04-13 | Disposition: A | Discharge status: Home / Self Care | Payer: MEDICAID | Source: Ambulatory Visit | Attending: Family Medicine | Admitting: Family Medicine

## 2024-04-13 ENCOUNTER — Other Ambulatory Visit (HOSPITAL_COMMUNITY): Payer: Self-pay

## 2024-04-13 ENCOUNTER — Encounter: Payer: Self-pay | Admitting: Family Medicine

## 2024-04-13 VITALS — BP 112/79 | HR 90 | Wt 203.0 lb

## 2024-04-13 DIAGNOSIS — Z8632 Personal history of gestational diabetes: Secondary | ICD-10-CM

## 2024-04-13 DIAGNOSIS — O26893 Other specified pregnancy related conditions, third trimester: Secondary | ICD-10-CM | POA: Diagnosis present

## 2024-04-13 DIAGNOSIS — F321 Major depressive disorder, single episode, moderate: Secondary | ICD-10-CM | POA: Diagnosis not present

## 2024-04-13 DIAGNOSIS — Z3A33 33 weeks gestation of pregnancy: Secondary | ICD-10-CM

## 2024-04-13 DIAGNOSIS — N898 Other specified noninflammatory disorders of vagina: Secondary | ICD-10-CM

## 2024-04-13 DIAGNOSIS — A6000 Herpesviral infection of urogenital system, unspecified: Secondary | ICD-10-CM | POA: Diagnosis not present

## 2024-04-13 DIAGNOSIS — A6004 Herpesviral vulvovaginitis: Secondary | ICD-10-CM

## 2024-04-13 DIAGNOSIS — O0993 Supervision of high risk pregnancy, unspecified, third trimester: Secondary | ICD-10-CM

## 2024-04-13 DIAGNOSIS — O0990 Supervision of high risk pregnancy, unspecified, unspecified trimester: Secondary | ICD-10-CM

## 2024-04-13 DIAGNOSIS — O219 Vomiting of pregnancy, unspecified: Secondary | ICD-10-CM

## 2024-04-13 DIAGNOSIS — F1190 Opioid use, unspecified, uncomplicated: Secondary | ICD-10-CM

## 2024-04-13 DIAGNOSIS — O365930 Maternal care for other known or suspected poor fetal growth, third trimester, not applicable or unspecified: Secondary | ICD-10-CM

## 2024-04-13 DIAGNOSIS — Z8759 Personal history of other complications of pregnancy, childbirth and the puerperium: Secondary | ICD-10-CM

## 2024-04-13 DIAGNOSIS — O99013 Anemia complicating pregnancy, third trimester: Secondary | ICD-10-CM

## 2024-04-13 MED ORDER — ONDANSETRON 4 MG PO TBDP
4.0000 mg | ORAL_TABLET | Freq: Four times a day (QID) | ORAL | 5 refills | Status: DC | PRN
  Filled 2024-04-13: qty 20, 5d supply, fill #0

## 2024-04-13 MED ORDER — VALACYCLOVIR HCL 1 G PO TABS
1000.0000 mg | ORAL_TABLET | Freq: Two times a day (BID) | ORAL | 3 refills | Status: DC
  Filled 2024-04-13: qty 30, 15d supply, fill #0

## 2024-04-13 MED ORDER — VALACYCLOVIR HCL 500 MG PO TABS
500.0000 mg | ORAL_TABLET | Freq: Two times a day (BID) | ORAL | 2 refills | Status: DC
  Filled 2024-04-13: qty 60, 30d supply, fill #0

## 2024-04-13 MED ORDER — ONDANSETRON HCL 8 MG PO TABS
8.0000 mg | ORAL_TABLET | Freq: Three times a day (TID) | ORAL | 2 refills | Status: DC | PRN
  Filled 2024-05-14: qty 40, 14d supply, fill #0

## 2024-04-13 MED ORDER — FLUCONAZOLE 150 MG PO TABS
150.0000 mg | ORAL_TABLET | Freq: Once | ORAL | 0 refills | Status: AC
Start: 2024-04-13 — End: 2024-04-14
  Filled 2024-04-13: qty 1, 1d supply, fill #0

## 2024-04-13 NOTE — Progress Notes (Signed)
 Subjective:  Tracey Ho is a 28 y.o. [phone] at [redacted]w[redacted]d being seen today for ongoing prenatal care.  She is currently monitored for the following issues for this high-risk pregnancy and has MDD (major depressive disorder), single episode, moderate (HCC); ADHD (attention deficit hyperactivity disorder), combined type; Cannabis abuse; Opioid use disorder; Recurrent genital herpes; History of suicide attempt; History of gestational hypertension; Anxiety; Hidradenitis suppurativa; Supervision of high risk pregnancy, antepartum; History of gestational diabetes; Chlamydia trachomatis infection in mother during second trimester of pregnancy; Anemia of pregnancy; Genital warts; Iron  deficiency anemia; Migraine without aura and without status migrainosus, not intractable; History of self mutilation; Maternal drug use complicating pregnancy, antepartum; BMI 30.0-30.9,adult; Shortness of breath; Marijuana smoker; Dyspnea; Acute hypoxic respiratory failure (HCC); Continuous dependence on cigarette smoking; Reactive airway disease with acute exacerbation; Tobacco abuse; and IUGR (intrauterine growth restriction) affecting care of mother on their problem list.  Patient reports significant nausea, ran out of zofran . Needs GTT but doesn't want to do it today.  Contractions: Irregular. Vag. Bleeding: None.  Movement: Present. Denies leaking of fluid.   The following portions of the patient's history were reviewed and updated as appropriate: allergies, current medications, past family history, past medical history, past social history, past surgical history and problem list. Problem list updated.  Objective:   Vitals:   04/13/24 1459  BP: 112/79  Pulse: 90  Weight: 203 lb (92.1 kg)    Fetal Status: Fetal Heart Rate (bpm): 146   Movement: Present     General:  Alert, oriented and cooperative. Patient is in no acute distress.  Skin: Skin is warm and dry. No rash noted.   Cardiovascular: Normal heart rate noted   Respiratory: Normal respiratory effort, no problems with respiration noted  Abdomen: Soft, gravid, appropriate for gestational age. Pain/Pressure: Present     Pelvic: Vag. Bleeding: None Vag D/C Character: Mucous   Cervical exam performed        Extremities: Normal range of motion.  Edema: None  Mental Status: Normal mood and affect. Normal behavior. Normal judgment and thought content.   Urinalysis:      PDMP reviewed during this encounter.    Last UDS: Lab Results  Component Value Date   CREATIUR 250 03/31/2024     Assessment and Plan:  Pregnancy: [id number] at [redacted]w[redacted]d  1. Supervision of high risk pregnancy, antepartum (Primary) BP and FHR normal Collect swabs at next visit unless IUGR is completely resolved Having some discharge, swab collected Worried she has a yeast infection, empiric fluconazole  sent Having inconsistent contractions, cervix appears multiparous and not clearly dilated on speculum exam (did not do digital exam given c/f active HSV outbreak, see below)  2. Recurrent genital herpes Not on valtrex  suppression, feels like she has a current outbreak Discussed if not symptom free by time she needs to be delivered that it would warrant a cesarean On exam does have two lesions on R labia majora that appear to be HSV lesions that are not yet unroofed Sent treatment as well as prophylaxis  3. Opioid use disorder Last UDS with bup present in appropriate ratios, however also with some oxycodone  Reports she ran out of her medicine and she used some oxycodone  to stave off withdrawals Reports none since then Repeat UDS next visit Not yet due for refill  4. MDD (major depressive disorder), single episode, moderate (HCC) Not addressed at this visit  5. Poor fetal growth affecting management of mother in third trimester, single or unspecified fetus Last growth  US  03/22/2024 EFW 1216g 2.1%, AC 2.8%, AFI wnl, c/w severe IUGR More recent UAD were normal Has repeat growth  scan tomorrow, timing of delivery to be based on that scan  6. History of gestational hypertension Not taking ASA Normotensive  7. History of gestational diabetes Has not yet had GDM testing Does not want to do GTT, will reschedule for this A1c today  8. Anemia during pregnancy in third trimester Lab Results  Component Value Date   HGB 10.8 (L) 03/02/2024  Much improved s/p IV venofer  x2  Preterm labor symptoms and general obstetric precautions including but not limited to vaginal bleeding, contractions, leaking of fluid and fetal movement were reviewed in detail with the patient. Please refer to After Visit Summary for other counseling recommendations.  Return in 2 weeks (on 04/27/2024) for [facility] clinic, ob visit.  Future Appointments  Date Time Provider Department Center  04/14/2024  2:15 PM [facility] PROVIDER 1 [facility]  04/14/2024  2:30 PM [facility] US5 [facility]  04/21/2024 10:15 AM [facility] PROVIDER 1 [facility]  04/21/2024 10:30 AM [facility] US2 [facility]     Lola Donnice HERO, MD

## 2024-04-13 NOTE — Progress Notes (Signed)
   Subjective:   Tracey Ho is a 28 y.o. [phone] here today for ongoing prenatal care, substance use disorder management and substance exposed newborn care.  Tracey Ho reports running out of her buprenorphine  and being unable to contact REACH providers for a refill, having a brief return to use, and not feeling well today.  Health Maintenance Due  Topic Date Due   Hepatitis B Vaccines (3 of 3 - 3-dose series) 11/26/1999   HPV VACCINES (1 - 3-dose series) Never done   Pneumococcal Vaccine 12-10 Years old (1 of 2 - PCV) 06/12/2017    Past Medical History:  Diagnosis Date   ADHD (attention deficit hyperactivity disorder)    Anemia    Depression    Migraine headache    Oral thrush 12/04/2023   PID (acute pelvic inflammatory disease) 10/22/2020   SOB (shortness of breath) 01/02/2024   Unwanted fertility 05/02/2022    Past Surgical History:  Procedure Laterality Date   NERVE SURGERY     NO PAST SURGERIES      The following portions of the patient's history were reviewed and updated as appropriate: allergies, current medications, past family history, past medical history, past social history, past surgical history and problem list.     Objective:  Tracey Ho is quiet but with appropriate responses and questions.    Assessment and Plan:  Substance exposed newborn contact information given to Tracey Ho again with the understanding that this contact is not for medical emergencies but can be used if she needs assistance with medication refills.  Will follow at next visit, sooner if needs arise. Problem List Items Addressed This Visit       Genitourinary   Recurrent genital herpes     Other   MDD (major depressive disorder), single episode, moderate (HCC)   Opioid use disorder   History of gestational hypertension   Supervision of high risk pregnancy, antepartum - Primary   History of gestational diabetes   Anemia of pregnancy   IUGR (intrauterine growth restriction) affecting care  of mother   Other Visit Diagnoses       Herpes simplex vulvovaginitis         Nausea/vomiting in pregnancy           Routine preventative health maintenance measures emphasized. Please refer to After Visit Summary for other counseling recommendations.   Return in 2 weeks (on 04/27/2024) for REACH clinic, ob visit.    Total face-to-face time with patient: 10 minutes.  Over 50% of encounter was spent on counseling and coordination of care.   Delon LITTIE Frater, NNP-BC Neonatal Nurse Practitioner Substance Exposed Newborn Consult at the [facility] [phone]

## 2024-04-13 NOTE — Patient Instructions (Signed)

## 2024-04-14 ENCOUNTER — Ambulatory Visit: Payer: MEDICAID | Admitting: *Deleted

## 2024-04-14 ENCOUNTER — Ambulatory Visit: Payer: Self-pay | Admitting: Family Medicine

## 2024-04-14 ENCOUNTER — Ambulatory Visit: Payer: MEDICAID | Attending: Obstetrics

## 2024-04-14 ENCOUNTER — Other Ambulatory Visit: Payer: Self-pay | Admitting: Obstetrics

## 2024-04-14 ENCOUNTER — Ambulatory Visit: Payer: MEDICAID | Admitting: Obstetrics and Gynecology

## 2024-04-14 VITALS — BP 127/72 | HR 70

## 2024-04-14 DIAGNOSIS — O365930 Maternal care for other known or suspected poor fetal growth, third trimester, not applicable or unspecified: Secondary | ICD-10-CM

## 2024-04-14 DIAGNOSIS — O99013 Anemia complicating pregnancy, third trimester: Secondary | ICD-10-CM

## 2024-04-14 DIAGNOSIS — O09293 Supervision of pregnancy with other poor reproductive or obstetric history, third trimester: Secondary | ICD-10-CM

## 2024-04-14 DIAGNOSIS — O0990 Supervision of high risk pregnancy, unspecified, unspecified trimester: Secondary | ICD-10-CM | POA: Diagnosis present

## 2024-04-14 DIAGNOSIS — O99213 Obesity complicating pregnancy, third trimester: Secondary | ICD-10-CM

## 2024-04-14 DIAGNOSIS — Z3A33 33 weeks gestation of pregnancy: Secondary | ICD-10-CM | POA: Diagnosis not present

## 2024-04-14 DIAGNOSIS — F1190 Opioid use, unspecified, uncomplicated: Secondary | ICD-10-CM

## 2024-04-14 DIAGNOSIS — E669 Obesity, unspecified: Secondary | ICD-10-CM

## 2024-04-14 LAB — HEMOGLOBIN A1C
Est. average glucose Bld gHb Est-mCnc: 97 mg/dL
Hgb A1c MFr Bld: 5 % (ref 4.8–5.6)

## 2024-04-14 NOTE — Progress Notes (Signed)
 Maternal-Fetal Medicine Consultation Name: Tracey Ho MRN: [id number]  G6 [id number] at 33w 5d gestation.  Severe fetal growth restriction.  Patient returned for fetal growth assessment and antenatal testing.  She does not have hypertension or gestational diabetes.  Blood pressure today at our office is 127/72 mmHg.  Ultrasound On today's ultrasound, the estimated fetal weight is at the 1st percentile and the abdominal circumference measurement at the 2nd percentile.  Interval weight gain is 457 g over 3 weeks.  Amniotic fluid is normal good fetal activity seen.  Umbilical artery Doppler showed normal forward diastolic flow.  NST is reactive.  BPP 10/10.  Severe fetal growth restriction Her pregnancy is well dated by 7-week ultrasound.  I explained the finding of severe fetal growth restriction and adequate interval growth.  I counseled the patient that the most likely cause of fetal growth restriction is placental insufficiency.  Patient does not give history of fetal growth restriction in previous pregnancies. I discussed ultrasound protocol and timing of delivery.  We recommend a follow-up fetal growth assessment in 3 weeks.  If severe fetal growth restriction persists, we will recommend delivery at [redacted] weeks gestation.  Patient was counseled that severe fetal growth restriction is associated with increased risks of perinatal mortality and morbidity.  If no severe fetal growth restriction is seen, delivery may be delayed to 69- or 39-weeks' gestation.  Recommendations - Continue weekly BPP, NST and UA Doppler studies till delivery. - Fetal growth assessment in 3 weeks and discussed timing of delivery.    Consultation including face-to-face (more than 50%) counseling 20 minutes.

## 2024-04-14 NOTE — Procedures (Signed)
 Tracey Ho 07/17/1996 [redacted]w[redacted]d  Fetus A Non-Stress Test Interpretation for 04/14/24-NST with BPP  Indication: IUGR  Fetal Heart Rate A Mode: External Baseline Rate (A): 130 bpm Variability: Moderate Accelerations: 15 x 15 Decelerations: None Multiple birth?: No  Uterine Activity Mode: Toco Contraction Frequency (min): 1 u/c during nST Contraction Duration (sec): 60 Contraction Quality: Mild Resting Tone Palpated: Relaxed  Interpretation (Fetal Testing) Nonstress Test Interpretation: Reactive Comments: Tracing reviewed byDr. Arna

## 2024-04-15 LAB — CERVICOVAGINAL ANCILLARY ONLY
Bacterial Vaginitis (gardnerella): NEGATIVE
Candida Glabrata: NEGATIVE
Candida Vaginitis: NEGATIVE
Chlamydia: NEGATIVE
Comment: NEGATIVE
Comment: NEGATIVE
Comment: NEGATIVE
Comment: NEGATIVE
Comment: NEGATIVE
Comment: NORMAL
Neisseria Gonorrhea: NEGATIVE
Trichomonas: NEGATIVE

## 2024-04-20 ENCOUNTER — Other Ambulatory Visit: Payer: MEDICAID

## 2024-04-21 ENCOUNTER — Other Ambulatory Visit: Payer: MEDICAID

## 2024-04-21 ENCOUNTER — Ambulatory Visit: Payer: MEDICAID

## 2024-04-22 ENCOUNTER — Other Ambulatory Visit: Payer: Self-pay

## 2024-04-23 ENCOUNTER — Other Ambulatory Visit (HOSPITAL_COMMUNITY): Payer: Self-pay

## 2024-04-27 ENCOUNTER — Other Ambulatory Visit: Payer: Self-pay

## 2024-04-27 ENCOUNTER — Ambulatory Visit: Payer: MEDICAID | Attending: Obstetrics

## 2024-04-27 ENCOUNTER — Ambulatory Visit (HOSPITAL_BASED_OUTPATIENT_CLINIC_OR_DEPARTMENT_OTHER): Payer: MEDICAID | Admitting: Obstetrics

## 2024-04-27 ENCOUNTER — Ambulatory Visit: Payer: MEDICAID | Admitting: Advanced Practice Midwife

## 2024-04-27 ENCOUNTER — Other Ambulatory Visit: Payer: MEDICAID

## 2024-04-27 ENCOUNTER — Other Ambulatory Visit (HOSPITAL_COMMUNITY): Payer: Self-pay

## 2024-04-27 VITALS — BP 116/77 | HR 80 | Wt 199.4 lb

## 2024-04-27 VITALS — BP 109/67 | HR 76

## 2024-04-27 DIAGNOSIS — F1190 Opioid use, unspecified, uncomplicated: Secondary | ICD-10-CM

## 2024-04-27 DIAGNOSIS — O26899 Other specified pregnancy related conditions, unspecified trimester: Secondary | ICD-10-CM

## 2024-04-27 DIAGNOSIS — O99213 Obesity complicating pregnancy, third trimester: Secondary | ICD-10-CM | POA: Diagnosis not present

## 2024-04-27 DIAGNOSIS — Z3A35 35 weeks gestation of pregnancy: Secondary | ICD-10-CM

## 2024-04-27 DIAGNOSIS — O0990 Supervision of high risk pregnancy, unspecified, unspecified trimester: Secondary | ICD-10-CM | POA: Diagnosis not present

## 2024-04-27 DIAGNOSIS — O365930 Maternal care for other known or suspected poor fetal growth, third trimester, not applicable or unspecified: Secondary | ICD-10-CM

## 2024-04-27 DIAGNOSIS — O99320 Drug use complicating pregnancy, unspecified trimester: Secondary | ICD-10-CM

## 2024-04-27 DIAGNOSIS — E669 Obesity, unspecified: Secondary | ICD-10-CM

## 2024-04-27 DIAGNOSIS — O26893 Other specified pregnancy related conditions, third trimester: Secondary | ICD-10-CM

## 2024-04-27 DIAGNOSIS — O0993 Supervision of high risk pregnancy, unspecified, third trimester: Secondary | ICD-10-CM | POA: Diagnosis not present

## 2024-04-27 DIAGNOSIS — Z3009 Encounter for other general counseling and advice on contraception: Secondary | ICD-10-CM

## 2024-04-27 DIAGNOSIS — O99013 Anemia complicating pregnancy, third trimester: Secondary | ICD-10-CM | POA: Diagnosis not present

## 2024-04-27 DIAGNOSIS — A6000 Herpesviral infection of urogenital system, unspecified: Secondary | ICD-10-CM | POA: Diagnosis not present

## 2024-04-27 DIAGNOSIS — D649 Anemia, unspecified: Secondary | ICD-10-CM

## 2024-04-27 DIAGNOSIS — O99323 Drug use complicating pregnancy, third trimester: Secondary | ICD-10-CM

## 2024-04-27 DIAGNOSIS — Z6791 Unspecified blood type, Rh negative: Secondary | ICD-10-CM | POA: Diagnosis not present

## 2024-04-27 MED ORDER — RHO D IMMUNE GLOBULIN 1500 UNIT/2ML IJ SOSY
300.0000 ug | PREFILLED_SYRINGE | Freq: Once | INTRAMUSCULAR | Status: AC
  Administered 2024-04-27: 300 ug via INTRAMUSCULAR

## 2024-04-27 MED ORDER — BUPRENORPHINE HCL-NALOXONE HCL 8-2 MG SL FILM
1.0000 | ORAL_FILM | Freq: Four times a day (QID) | SUBLINGUAL | 0 refills | Status: AC
  Filled 2024-04-27 – 2024-04-28 (×2): qty 120, 30d supply, fill #0

## 2024-04-27 NOTE — Progress Notes (Signed)
 MFM Consult Note  Tracey Ho is currently at 35 weeks and 4 days.  She has been followed due to severe IUGR.    She denies any problems since her last exam and reports feeling fetal movements throughout the day.  A BPP performed today was 8 out of 8.  There was normal amniotic fluid noted with a total AFI of 8.12 cm.  Doppler studies of the umbilical arteries performed due to fetal growth restriction showed a normal S/D ratio of 2.17.  There were no signs of absent or reversed end-diastolic flow noted today.  She will return in 1 week for a BPP, growth scan, and umbilical artery Doppler study.     Should severe IUGR continue to be noted on her growth scan next week, delivery will be recommended at between 37 to 38 weeks.  The patient stated that all of her questions were answered today.  A total of 15 minutes was spent counseling and coordinating the care for this patient.  Greater than 50% of the time was spent in direct face-to-face contact.

## 2024-04-27 NOTE — Progress Notes (Signed)
 BTL consent form signed with patient and placed in folder to be scanned. Patient made aware by [place]  CNM this will be interval BTL.  Vernell RN 04/27/24 1622

## 2024-04-27 NOTE — Progress Notes (Signed)
 Subjective:  Tracey Ho is a 28 y.o. [phone] at [redacted]w[redacted]d being seen today for ongoing prenatal care at [facility] (.  She is currently monitored for the following issues for this high-risk pregnancy and has MDD (major depressive disorder), single episode, moderate (HCC); ADHD (attention deficit hyperactivity disorder), combined type; Opioid use disorder; Rh negative state in antepartum period; Recurrent genital herpes; History of suicide attempt; History of gestational hypertension; Anxiety; Hidradenitis suppurativa; Supervision of high risk pregnancy, antepartum; History of gestational diabetes; Chlamydia trachomatis infection in mother during second trimester of pregnancy; Anemia of pregnancy; Genital warts; Iron  deficiency anemia; Migraine without aura and without status migrainosus, not intractable; History of self mutilation; Maternal drug use complicating pregnancy, antepartum; BMI 30.0-30.9,adult; Shortness of breath; Marijuana smoker; Dyspnea; Acute hypoxic respiratory failure (HCC); Continuous dependence on cigarette smoking; Reactive airway disease with acute exacerbation; Tobacco abuse; and IUGR (intrauterine growth restriction) affecting care of mother on their problem list.  Patient reports increased contractions. HSV lesions healed by 04/15/24 after starting Valtrex .   Contractions: Irregular. Vag. Bleeding: None.  Movement: Present. Denies leaking of fluid. Denies withdrawal Sx or cravings on Suboxone  QID. + Oxy on last ToxScreen. Reported using after running our of Suboxone  although the timing of prescriptions does not line up for that.   The following portions of the patient's history were reviewed and updated as appropriate: allergies, current medications, past family history, past medical history, past social history, past surgical history and problem list. Problem list updated.  Has missed several appointments including appt for 28 week labs, Rhogam, etc. Initially declined Rhogam.    Objective:   Vitals:   04/27/24 1344  BP: 116/77  Pulse: 80  Weight: 199 lb 6.4 oz (90.4 kg)    Fetal Status: Fetal Heart Rate (bpm): 140   Movement: Present  Presentation: Vertex  General:  Alert, oriented and cooperative. Patient is in no acute distress.  Skin: Skin is warm and dry. No rash noted.   Cardiovascular: Normal heart rate noted  Respiratory: Normal respiratory effort, no problems with respiration noted  Abdomen: Soft, gravid, appropriate for gestational age. Pain/Pressure: Absent     Pelvic: Vag. Bleeding: None Vag D/C Character: White   Cervical exam performed Dilation: 1.5 Effacement (%): 0 Station: -2  Extremities: Normal range of motion.  Edema: None  Mental Status: Normal mood and affect. Normal behavior. Normal judgment and thought content.   Urinalysis:      PDMP reviewed during this encounter.   Last UDS: Lab Results  Component Value Date   CREATIUR 250 03/31/2024     04/13/24 US : EFW 1.4%   Assessment and Plan:  Pregnancy: [id number] at [redacted]w[redacted]d  1. Supervision of high risk pregnancy, antepartum (Primary) - ToxAssure Flex 15, Ur - Culture, beta strep (group b only)  2. [redacted] weeks gestation of pregnancy - Plans BTL. Consent signed today. Plan interval BTL.   3. Recurrent genital herpes - Lesions resolved 04/15/24. No lesions on spec exam today. Instructed to continue Valtrex  500 BID for rest of pregnancy to reduce risk of recurrence. Discussed indications for C/S for recent outbreak. Should be OK for vaginal birth at term. Pt STRONGLY hopes to avoid C/S.   4. Opioid use disorder- Last ToxAssure + Oxy - ToxAssure Flex 15, Ur - Culture, beta strep (group b only) - Comp Met (CMET) - CBC - Buprenorphine  HCl-Naloxone  HCl (SUBOXONE ) 8-2 MG FILM; Place 1 Film under the tongue in the morning, at noon, in the evening, and at bedtime.  Dispense:  120 Film; Refill: 0  5. Maternal drug use complicating pregnancy, antepartum - ToxAssure Flex 15, Ur - Culture,  beta strep (group b only) - Comp Met (CMET) - CBC - Buprenorphine  HCl-Naloxone  HCl (SUBOXONE ) 8-2 MG FILM; Place 1 Film under the tongue in the morning, at noon, in the evening, and at bedtime.  Dispense: 120 Film; Refill: 0  6. Poor fetal growth affecting management of mother in third trimester, single or unspecified fetus--SEVERE - Per MFM, deliver at 37 weeks if severe growth restriction persists. Pt questioning recommendation. Discussed risks associate with FGR including stillbirth. Dopplers and BPP today. Growth US  due 8/12. Will schedule 37 week IOL today to have the appointment slot and can move to late if next growth US  has improved. Pt still may decline.   7. Rh negative state in antepartum period [facility] previously) - rho (d) immune globulin  (RHIG/RHOPHYLAC ) injection 300 mcg   Preterm labor symptoms and general obstetric precautions including but not limited to vaginal bleeding, contractions, leaking of fluid and fetal movement were reviewed in detail with the patient. Please refer to After Visit Summary for other counseling recommendations.   Return in about 1 week (around 05/04/2024) for REACH ROB.   Future Appointments  Date Time Provider Department Center  04/27/2024  3:30 PM Ileana Babara Rushie Steffan, MD [facility]  04/27/2024  3:45 PM [facility] US4 [facility]  05/07/2024 12:00 AM [facility] SCHED ROOM [facility] None    Total face-to-face time with patient: 20 minutes.  Over 50% of encounter was spent on counseling and coordination of care.   Makaylia Hewett  Claudene HOWARD 04/27/2024 3:17 PM Center for [employer], [facility]

## 2024-04-27 NOTE — Progress Notes (Signed)
 Subjective:   Tracey Ho is a 28 y.o. [phone] here today for ongoing prenatal care, substance use disorder management and substance exposed newborn care.  Tracey Ho reports feeling well with no changes.  Health Maintenance Due  Topic Date Due   Hepatitis B Vaccines (3 of 3 - 3-dose series) 11/26/1999   HPV VACCINES (1 - 3-dose series) Never done   Pneumococcal Vaccine: 19-49 Years (1 of 2 - PCV) 06/12/2017    Past Medical History:  Diagnosis Date   ADHD (attention deficit hyperactivity disorder)    Anemia    Depression    Migraine headache    Oral thrush 12/04/2023   PID (acute pelvic inflammatory disease) 10/22/2020   SOB (shortness of breath) 01/02/2024   Unwanted fertility 05/02/2022    Past Surgical History:  Procedure Laterality Date   NERVE SURGERY     NO PAST SURGERIES      The following portions of the patient's history were reviewed and updated as appropriate: allergies, current medications, past family history, past medical history, past social history, past surgical history and problem list.     Objective:   Tracey Ho's is well appearing and asking clear questions with appropriate interaction.     Assessment and Plan:  Tracey Ho's baby is currently IUGR and begin followed for growth with repeat ultrasound next week.  We reviewed the weight criteria for NICU admission and feeding strategies, including increased caloric density feedings to promote postnatal growth.  She plans to initially provide breast milk as her sister will be assuming care/custody of this baby.  We talked about half breast milk feedings and half formula feedings or fortified breast milk to increase calories.  We reviewed extended newborn observation period and single family rooms if her  baby should need NICU admission.  Currently her induction is planned for 8/15.  She has contact information for substance exposed newborn team should needs arise prior to her next visit. Problem List Items Addressed  This Visit       Genitourinary   Recurrent genital herpes   Relevant Medications   rho (d) immune globulin  (RHIG/RHOPHYLAC ) injection 300 mcg     Other   Opioid use disorder   Relevant Medications   Buprenorphine  HCl-Naloxone  HCl (SUBOXONE ) 8-2 MG FILM   Other Relevant Orders   ToxAssure Flex 15, Ur   Culture, beta strep (group b only)   Comp Met (CMET)   CBC   Rh negative state in antepartum period   Relevant Medications   rho (d) immune globulin  (RHIG/RHOPHYLAC ) injection 300 mcg   Other Relevant Orders   Antibody screen   Supervision of high risk pregnancy, antepartum - Primary   Relevant Orders   ToxAssure Flex 15, Ur   Culture, beta strep (group b only)   Maternal drug use complicating pregnancy, antepartum   Relevant Medications   Buprenorphine  HCl-Naloxone  HCl (SUBOXONE ) 8-2 MG FILM   Other Relevant Orders   ToxAssure Flex 15, Ur   Culture, beta strep (group b only)   Comp Met (CMET)   CBC   IUGR (intrauterine growth restriction) affecting care of mother   Other Visit Diagnoses       [redacted] weeks gestation of pregnancy         Encounter for consultation for female sterilization           Routine preventative health maintenance measures emphasized. Please refer to After Visit Summary for other counseling recommendations.   Return in about 1 week (around 05/04/2024) for REACH ROB.  Total face-to-face time with patient: 10 minutes.  Over 50% of encounter was spent on counseling and coordination of care.   Tracey Ho, NNP-BC Neonatal Nurse Practitioner Substance Exposed Newborn Consult at the [facility] [phone]

## 2024-04-28 ENCOUNTER — Encounter: Payer: Self-pay | Admitting: Advanced Practice Midwife

## 2024-04-28 ENCOUNTER — Ambulatory Visit: Payer: Self-pay | Admitting: Advanced Practice Midwife

## 2024-04-28 ENCOUNTER — Other Ambulatory Visit: Payer: Self-pay | Admitting: *Deleted

## 2024-04-28 ENCOUNTER — Other Ambulatory Visit (HOSPITAL_COMMUNITY): Payer: Self-pay

## 2024-04-28 DIAGNOSIS — O365930 Maternal care for other known or suspected poor fetal growth, third trimester, not applicable or unspecified: Secondary | ICD-10-CM

## 2024-04-28 LAB — CBC
Hematocrit: 33.1 % — ABNORMAL LOW (ref 34.0–46.6)
Hemoglobin: 11 g/dL — ABNORMAL LOW (ref 11.1–15.9)
MCH: 29.3 pg (ref 26.6–33.0)
MCHC: 33.2 g/dL (ref 31.5–35.7)
MCV: 88 fL (ref 79–97)
Platelets: 194 x10E3/uL (ref 150–450)
RBC: 3.76 x10E6/uL — ABNORMAL LOW (ref 3.77–5.28)
RDW: 12.6 % (ref 11.7–15.4)
WBC: 4 x10E3/uL (ref 3.4–10.8)

## 2024-04-28 LAB — COMPREHENSIVE METABOLIC PANEL WITH GFR
ALT: 5 IU/L (ref 0–32)
AST: 10 IU/L (ref 0–40)
Albumin: 3.3 g/dL — ABNORMAL LOW (ref 4.0–5.0)
Alkaline Phosphatase: 127 IU/L — ABNORMAL HIGH (ref 44–121)
BUN/Creatinine Ratio: 9 (ref 9–23)
BUN: 5 mg/dL — ABNORMAL LOW (ref 6–20)
Bilirubin Total: 0.5 mg/dL (ref 0.0–1.2)
CO2: 18 mmol/L — ABNORMAL LOW (ref 20–29)
Calcium: 8.7 mg/dL (ref 8.7–10.2)
Chloride: 104 mmol/L (ref 96–106)
Creatinine, Ser: 0.57 mg/dL (ref 0.57–1.00)
Globulin, Total: 3 g/dL (ref 1.5–4.5)
Glucose: 98 mg/dL (ref 70–99)
Potassium: 3.8 mmol/L (ref 3.5–5.2)
Sodium: 134 mmol/L (ref 134–144)
Total Protein: 6.3 g/dL (ref 6.0–8.5)
eGFR: 129 mL/min/1.73 (ref >59)

## 2024-04-28 LAB — GLUCOSE TOLERANCE, 2 HOURS W/ 1HR
Glucose, 1 hour: 93 mg/dL (ref 70–179)
Glucose, 2 hour: 85 mg/dL (ref 70–152)
Glucose, Fasting: 82 mg/dL (ref 70–91)

## 2024-04-28 LAB — ANTIBODY SCREEN: Antibody Screen: NEGATIVE

## 2024-04-30 ENCOUNTER — Telehealth (HOSPITAL_COMMUNITY): Payer: Self-pay | Admitting: *Deleted

## 2024-04-30 NOTE — Telephone Encounter (Signed)
 Preadmission screen

## 2024-05-01 ENCOUNTER — Encounter: Payer: Self-pay | Admitting: Family Medicine

## 2024-05-01 LAB — CULTURE, BETA STREP (GROUP B ONLY): Strep Gp B Culture: NEGATIVE

## 2024-05-01 LAB — TOXASSURE FLEX 15, UR
6-ACETYLMORPHINE IA: NEGATIVE ng/mL
7-aminoclonazepam: NOT DETECTED ng/mg{creat}
AMPHETAMINES IA: NEGATIVE ng/mL
Alpha-hydroxyalprazolam: NOT DETECTED ng/mg{creat}
Alpha-hydroxymidazolam: NOT DETECTED ng/mg{creat}
Alpha-hydroxytriazolam: NOT DETECTED ng/mg{creat}
Alprazolam: NOT DETECTED ng/mg{creat}
BARBITURATES IA: NEGATIVE ng/mL
BUPRENORPHINE: POSITIVE
Benzodiazepines: NEGATIVE
Buprenorphine: 89 ng/mg{creat}
COCAINE METABOLITE IA: NEGATIVE ng/mL
Clonazepam: NOT DETECTED ng/mg{creat}
Creatinine: 204 mg/dL (ref >=20)
Desalkylflurazepam: NOT DETECTED ng/mg{creat}
Desmethyldiazepam: NOT DETECTED ng/mg{creat}
Desmethylflunitrazepam: NOT DETECTED ng/mg{creat}
Diazepam: NOT DETECTED ng/mg{creat}
ETHYL ALCOHOL Enzymatic: POSITIVE g/dL
FENTANYL: NEGATIVE
Fentanyl: NOT DETECTED ng/mg{creat}
Flunitrazepam: NOT DETECTED ng/mg{creat}
Lorazepam: NOT DETECTED ng/mg{creat}
METHADONE IA: NEGATIVE ng/mL
METHADONE MTB IA: NEGATIVE ng/mL
Midazolam: NOT DETECTED ng/mg{creat}
Norbuprenorphine: 393 ng/mg{creat}
Norfentanyl: NOT DETECTED ng/mg{creat}
OPIATE CLASS IA: NEGATIVE ng/mL
Oxazepam: NOT DETECTED ng/mg{creat}
PHENCYCLIDINE IA: NEGATIVE ng/mL
TAPENTADOL, IA: NEGATIVE ng/mL
TRAMADOL IA: NEGATIVE ng/mL
Temazepam: NOT DETECTED ng/mg{creat}

## 2024-05-01 LAB — OXYCODONE CLASS, MS, UR RFX
Noroxycodone: 63 ng/mg{creat}
Noroxymorphone: 78 ng/mg{creat}
Oxycodone Class Confirmation: POSITIVE
Oxycodone: NOT DETECTED ng/mg{creat}
Oxymorphone: 66 ng/mg{creat}

## 2024-05-01 LAB — ALCOHOL, ETHYL, UR, QT
Alcohol, Ethyl: 0.022 g/dL
Alcohol, Ethyl: POSITIVE

## 2024-05-01 LAB — CANNABINOIDS, MS, UR RFX
Cannabinoids Confirmation: POSITIVE
Carboxy-THC: 70 ng/mg{creat}

## 2024-05-02 NOTE — ED Notes (Signed)
 An After Visit Summary was printed and given to the patient. Santa Pickle, RN 05/02/2024 / 1:54 AM

## 2024-05-03 ENCOUNTER — Ambulatory Visit: Payer: Self-pay | Admitting: Advanced Practice Midwife

## 2024-05-03 ENCOUNTER — Telehealth (HOSPITAL_COMMUNITY): Payer: Self-pay | Admitting: *Deleted

## 2024-05-03 NOTE — Telephone Encounter (Signed)
 Preadmission screen

## 2024-05-04 ENCOUNTER — Ambulatory Visit (HOSPITAL_BASED_OUTPATIENT_CLINIC_OR_DEPARTMENT_OTHER): Payer: MEDICAID | Admitting: Maternal & Fetal Medicine

## 2024-05-04 ENCOUNTER — Other Ambulatory Visit (HOSPITAL_COMMUNITY): Payer: Self-pay

## 2024-05-04 ENCOUNTER — Ambulatory Visit: Payer: MEDICAID | Attending: Obstetrics and Gynecology

## 2024-05-04 ENCOUNTER — Telehealth (HOSPITAL_COMMUNITY): Payer: Self-pay | Admitting: *Deleted

## 2024-05-04 ENCOUNTER — Other Ambulatory Visit: Payer: Self-pay | Admitting: Obstetrics and Gynecology

## 2024-05-04 ENCOUNTER — Ambulatory Visit (INDEPENDENT_AMBULATORY_CARE_PROVIDER_SITE_OTHER): Payer: MEDICAID | Admitting: Advanced Practice Midwife

## 2024-05-04 VITALS — BP 127/71

## 2024-05-04 VITALS — Wt 201.2 lb

## 2024-05-04 DIAGNOSIS — O99013 Anemia complicating pregnancy, third trimester: Secondary | ICD-10-CM | POA: Diagnosis not present

## 2024-05-04 DIAGNOSIS — A6000 Herpesviral infection of urogenital system, unspecified: Secondary | ICD-10-CM | POA: Diagnosis not present

## 2024-05-04 DIAGNOSIS — Z3A36 36 weeks gestation of pregnancy: Secondary | ICD-10-CM

## 2024-05-04 DIAGNOSIS — O365920 Maternal care for other known or suspected poor fetal growth, second trimester, not applicable or unspecified: Secondary | ICD-10-CM | POA: Diagnosis present

## 2024-05-04 DIAGNOSIS — E669 Obesity, unspecified: Secondary | ICD-10-CM | POA: Diagnosis not present

## 2024-05-04 DIAGNOSIS — O0990 Supervision of high risk pregnancy, unspecified, unspecified trimester: Secondary | ICD-10-CM | POA: Diagnosis present

## 2024-05-04 DIAGNOSIS — O99323 Drug use complicating pregnancy, third trimester: Secondary | ICD-10-CM

## 2024-05-04 DIAGNOSIS — O99213 Obesity complicating pregnancy, third trimester: Secondary | ICD-10-CM

## 2024-05-04 DIAGNOSIS — D649 Anemia, unspecified: Secondary | ICD-10-CM

## 2024-05-04 DIAGNOSIS — O365930 Maternal care for other known or suspected poor fetal growth, third trimester, not applicable or unspecified: Secondary | ICD-10-CM | POA: Diagnosis not present

## 2024-05-04 DIAGNOSIS — B3731 Acute candidiasis of vulva and vagina: Secondary | ICD-10-CM

## 2024-05-04 DIAGNOSIS — F1190 Opioid use, unspecified, uncomplicated: Secondary | ICD-10-CM | POA: Diagnosis not present

## 2024-05-04 DIAGNOSIS — O99320 Drug use complicating pregnancy, unspecified trimester: Secondary | ICD-10-CM

## 2024-05-04 DIAGNOSIS — O09293 Supervision of pregnancy with other poor reproductive or obstetric history, third trimester: Secondary | ICD-10-CM

## 2024-05-04 MED ORDER — FLUCONAZOLE 150 MG PO TABS
150.0000 mg | ORAL_TABLET | ORAL | 1 refills | Status: DC | PRN
  Filled 2024-05-04: qty 3, 9d supply, fill #0

## 2024-05-04 NOTE — Progress Notes (Addendum)
 Patient information  Patient Name: SUSSAN METER  Patient MRN:   [id number]  Referring practice: MFM Referring Provider: [facility])  Problem List   Patient Active Problem List   Diagnosis Date Noted   IUGR (intrauterine growth restriction) affecting care of mother 02/22/2024   Tobacco abuse 01/03/2024   Shortness of breath 01/02/2024   Dyspnea 01/02/2024   Acute hypoxic respiratory failure (HCC) 01/02/2024   Continuous dependence on cigarette smoking 01/02/2024   Reactive airway disease with acute exacerbation 01/02/2024   Iron  deficiency anemia 12/09/2023   Maternal drug use complicating pregnancy, antepartum 12/09/2023   Chlamydia trachomatis infection in mother during second trimester of pregnancy 12/01/2023   Anemia of pregnancy 12/01/2023   Supervision of high risk pregnancy, antepartum 11/18/2023   Hidradenitis suppurativa 03/25/2023   Anxiety 02/18/2023   History of gestational hypertension 10/17/2022   Rh negative state in antepartum period 07/22/2022   Opioid use disorder 03/27/2022   History of gestational diabetes 01/11/2022   Genital warts 04/16/2021   Recurrent genital herpes 10/22/2020   Migraine without aura and without status migrainosus, not intractable 10/28/2017   BMI 30.0-30.9,adult 11/20/2015   History of suicide attempt 10/09/2012   History of self mutilation 10/09/2012   MDD (major depressive disorder), single episode, moderate (HCC) 08/04/2012   ADHD (attention deficit hyperactivity disorder), combined type 08/04/2012   Marijuana smoker 08/04/2012    Maternal Fetal medicine Consult  ALIZEY NOREN is a 28 y.o. H3E6976 at [redacted]w[redacted]d here for ultrasound and consultation. Analisse Finck is doing well today with no acute concerns. Today we focused on the following:   FGR: The estimated fetal weight went from the 1st percentile to the 6 percentile.  Due to this the delivery timing can be moved to 38 weeks.  The patient had already had  her delivery support system arranged for 37 weeks but we discussed the benefits of a 38-week newborn compared to a 37-week.     Sonographic findings Single intrauterine pregnancy at 36w 4d. Fetal cardiac activity:  Observed and appears normal. Presentation: Cephalic. Interval fetal anatomy appears normal. Fetal biometry shows the estimated fetal weight at the 6 percentile and the abdominal circumference at the 13 percentile.  Amniotic fluid: Within normal limits.  MVP: 4.04 cm. Placenta: Posterior. Umbilical artery dopplers findings: -S/D:2.49 which are normal at this gestational age.  BPP 8/8.   Recommendations - Continue weekly UA dopplers and antenatal testing.  - Delivery around 38 weeks if the patient is able to rearrange her support system for the time of delivery - Fetal movement precautions given  Review of Systems: A review of systems was performed and was negative except per HPI   Vitals and Physical Exam    05/04/2024    4:37 PM 05/04/2024    3:08 PM 04/27/2024    2:50 PM  Vitals with BMI  Weight 201 lbs 4 oz    Systolic  127 109  Diastolic  71 67  Pulse   76    Sitting comfortably on the sonogram table Nonlabored breathing20 Normal rate and rhythm Abdomen is nontender  Past pregnancies OB History  Gravida Para Term Preterm AB Living  6 3 3  0 2 3  SAB IAB Ectopic Multiple Live Births  2 0 0 0 3    # Outcome Date GA Lbr Len/2nd Weight Sex Type Anes PTL Lv  6 Current           5 Term 08/09/22 [redacted]w[redacted]d  M Vag-Spont   LIV  4 Term 2017     Vag-Spont   LIV  3 Term 2016     Vag-Spont   LIV     Birth Comments: System Generated. Please review and update pregnancy details.  2 SAB 2014          1 SAB 2014            I spent 20 minutes reviewing the patients chart, including labs and images as well as counseling the patient about her medical conditions. Greater than 50% of the time was spent in direct face-to-face patient counseling.  Delora Smaller  MFM, [facility]    05/04/2024  4:44 PM

## 2024-05-04 NOTE — Progress Notes (Signed)
 Subjective:  Tracey Ho is a 28 y.o. [phone] at [redacted]w[redacted]d being seen today for ongoing prenatal care.  She is currently monitored for the following issues for this high-risk pregnancy and has MDD (major depressive disorder), single episode, moderate (HCC); ADHD (attention deficit hyperactivity disorder), combined type; Opioid use disorder; Rh negative state in antepartum period; Recurrent genital herpes; History of suicide attempt; History of gestational hypertension; Anxiety; Hidradenitis suppurativa; Supervision of high risk pregnancy, antepartum; History of gestational diabetes; Chlamydia trachomatis infection in mother during second trimester of pregnancy; Anemia of pregnancy; Genital warts; Iron  deficiency anemia; Migraine without aura and without status migrainosus, not intractable; History of self mutilation; Maternal drug use complicating pregnancy, antepartum; BMI 30.0-30.9,adult; Shortness of breath; Marijuana smoker; Dyspnea; Acute hypoxic respiratory failure (HCC); Continuous dependence on cigarette smoking; Reactive airway disease with acute exacerbation; Tobacco abuse; and IUGR (intrauterine growth restriction) affecting care of mother on their problem list.  Patient reports increased contractions and new painful genital lesion. State sshe has been taking 1000 mg Valtrex  QD as directed. .  Contractions: Irritability. Vag. Bleeding: None.  Movement: Present. Denies leaking of fluid.   The following portions of the patient's history were reviewed and updated as appropriate: allergies, current medications, past family history, past medical history, past social history, past surgical history and problem list. Problem list updated.  Denies withdrawal Sx or craving on current dose of Suboxone .   Objective:   Vitals:   05/04/24 1637  Weight: 201 lb 4 oz (91.3 kg)    Fetal Status: Fetal Heart Rate (bpm): 143   Movement: Present  Presentation: Vertex  General:  Alert, oriented and  cooperative. Patient is in no acute distress.  Skin: Skin is warm and dry. No rash noted.   Cardiovascular: Normal heart rate noted  Respiratory: Normal respiratory effort, no problems with respiration noted  Abdomen: Soft, gravid, appropriate for gestational age. Pain/Pressure: Present     Pelvic: Vag. Bleeding: None Vag D/C Character: White  3 mm painful vesicular lesion on clitoral hood. Mod amount of thick, white, curd-like discharge Cervical exam performed Dilation: 2 Effacement (%): 0 Station: -2  Extremities: Normal range of motion.  Edema: None  Mental Status: Normal mood and affect. Normal behavior. Normal judgment and thought content.   Urinalysis:      PDMP reviewed during this encounter.   Last UDS: Lab Results  Component Value Date   CREATIUR 204 04/27/2024     US  05/04/24  BPD:      83.6  mm     G. Age:  33w 5d        2.8  %    CI:        72.22   %    70 - 86                                                          FL/HC:      20.5   %    20.8 - 22.6  HC:       313   mm     G. Age:  35w 0d        3.5  %    HC/AC:      1.02        0.92 - 1.05  AC:  307   mm     G. Age:  34w 5d         13  %    FL/BPD:     76.9   %    71 - 87  FL:       64.3  mm     G. Age:  33w 1d        < 1  %    FL/AC:      20.9   %    20 - 24    LV:        5.5  mm    Est. FW:    2371  gm      5 lb 4 oz      6  %  AFI 7  Assessment and Plan:  Pregnancy: [id number] at [redacted]w[redacted]d  1. Recurrent genital herpes (Primary)-- SUSPECT NEW OUTBREAK - Herpes simplex virus culture - Dr. Lola discussed recommendation for C/S depending on interval btw outbreak and delivery. Discussed R/B HSV for newborn vs R/B C/S. Pt hopes to avoid C/S.   2. Opioid use disorder - ToxASSURE Select 13 (MW), Urine  3. Maternal drug use complicating pregnancy, antepartum - Feels like she's doing well on Suboxone  but Oxy still present in urine but dropping levels. - Very low Ethyl ETOH last ToxAssure. Denies alcohol   consumption and has not been + before. On review of possible causes of false + ETOH is vaginal yeast infection. Pt has had persistent and significant yeast infections at last several visits including today.    4. Poor fetal growth affecting management of mother in third trimester, single or unspecified fetus - Improved. No longer severe. Will more recommendation for delivery to 38-39 weeks per MFM   5. Vaginal yeast infection - fluconazole  (DIFLUCAN ) 150 MG tablet; Take 1 tablet (150 mg total) by mouth every three (3) days as needed.  Dispense: 3 tablet; Refill: 1   Term labor symptoms and general obstetric precautions including but not limited to vaginal bleeding, contractions, leaking of fluid and fetal movement were reviewed in detail with the patient. Please refer to After Visit Summary for other counseling recommendations.   No follow-ups on file.   Future Appointments  Date Time Provider Department Center  05/10/2024  3:15 PM [facility] PROVIDER 1 [facility]  05/10/2024  3:30 PM [facility] US2 [facility]  05/20/2024  7:00 AM [facility] SCHED ROOM [facility] None  06/08/2024  2:35 PM Lola Donnice HERO, MD [facility]      Kimla Furth  Claudene, CNM 05/04/2024 5:09 PM Center for [employer] [facility], [facility]

## 2024-05-04 NOTE — Telephone Encounter (Signed)
 Preadmission screen

## 2024-05-05 NOTE — Addendum Note (Signed)
 Addended by: JOSHUA METH C on: 05/05/2024 09:03 AM   Modules accepted: Orders

## 2024-05-06 ENCOUNTER — Inpatient Hospital Stay (HOSPITAL_COMMUNITY)
Admission: AD | Admit: 2024-05-06 | Discharge: 2024-05-07 | Disposition: A | Discharge status: Home / Self Care | Payer: MEDICAID | Attending: Obstetrics & Gynecology | Admitting: Obstetrics & Gynecology

## 2024-05-06 ENCOUNTER — Ambulatory Visit: Payer: Self-pay | Admitting: Family Medicine

## 2024-05-06 DIAGNOSIS — Z3A37 37 weeks gestation of pregnancy: Secondary | ICD-10-CM | POA: Insufficient documentation

## 2024-05-06 DIAGNOSIS — O471 False labor at or after 37 completed weeks of gestation: Secondary | ICD-10-CM | POA: Insufficient documentation

## 2024-05-06 DIAGNOSIS — G43909 Migraine, unspecified, not intractable, without status migrainosus: Secondary | ICD-10-CM | POA: Insufficient documentation

## 2024-05-06 DIAGNOSIS — G43801 Other migraine, not intractable, with status migrainosus: Secondary | ICD-10-CM

## 2024-05-06 DIAGNOSIS — O99353 Diseases of the nervous system complicating pregnancy, third trimester: Secondary | ICD-10-CM | POA: Insufficient documentation

## 2024-05-06 DIAGNOSIS — Z3689 Encounter for other specified antenatal screening: Secondary | ICD-10-CM

## 2024-05-06 DIAGNOSIS — O210 Mild hyperemesis gravidarum: Secondary | ICD-10-CM | POA: Insufficient documentation

## 2024-05-06 LAB — TOXASSURE SELECT 13 (MW), URINE

## 2024-05-07 ENCOUNTER — Inpatient Hospital Stay (HOSPITAL_COMMUNITY): Payer: MEDICAID

## 2024-05-07 ENCOUNTER — Other Ambulatory Visit: Payer: Self-pay

## 2024-05-07 ENCOUNTER — Other Ambulatory Visit (HOSPITAL_COMMUNITY): Payer: Self-pay

## 2024-05-07 ENCOUNTER — Encounter (HOSPITAL_COMMUNITY): Payer: Self-pay | Admitting: Obstetrics & Gynecology

## 2024-05-07 DIAGNOSIS — R519 Headache, unspecified: Secondary | ICD-10-CM | POA: Diagnosis present

## 2024-05-07 DIAGNOSIS — Z3A37 37 weeks gestation of pregnancy: Secondary | ICD-10-CM | POA: Diagnosis not present

## 2024-05-07 DIAGNOSIS — O26893 Other specified pregnancy related conditions, third trimester: Secondary | ICD-10-CM | POA: Diagnosis not present

## 2024-05-07 DIAGNOSIS — O471 False labor at or after 37 completed weeks of gestation: Secondary | ICD-10-CM | POA: Diagnosis not present

## 2024-05-07 DIAGNOSIS — O99353 Diseases of the nervous system complicating pregnancy, third trimester: Secondary | ICD-10-CM | POA: Diagnosis not present

## 2024-05-07 DIAGNOSIS — G43909 Migraine, unspecified, not intractable, without status migrainosus: Secondary | ICD-10-CM | POA: Diagnosis not present

## 2024-05-07 DIAGNOSIS — G43801 Other migraine, not intractable, with status migrainosus: Secondary | ICD-10-CM

## 2024-05-07 DIAGNOSIS — O210 Mild hyperemesis gravidarum: Secondary | ICD-10-CM | POA: Diagnosis not present

## 2024-05-07 LAB — URINALYSIS, ROUTINE W REFLEX MICROSCOPIC
Glucose, UA: NEGATIVE mg/dL
Hgb urine dipstick: NEGATIVE
Ketones, ur: 20 mg/dL — AB
Leukocytes,Ua: NEGATIVE
Nitrite: NEGATIVE
Protein, ur: 100 mg/dL — AB
Specific Gravity, Urine: 1.034 — ABNORMAL HIGH (ref 1.005–1.030)
pH: 5 (ref 5.0–8.0)

## 2024-05-07 MED ORDER — METOCLOPRAMIDE HCL 5 MG/ML IJ SOLN
10.0000 mg | Freq: Once | INTRAMUSCULAR | Status: AC
  Administered 2024-05-07: 10 mg via INTRAVENOUS
  Filled 2024-05-07: qty 2

## 2024-05-07 MED ORDER — DIPHENHYDRAMINE HCL 50 MG/ML IJ SOLN
25.0000 mg | Freq: Once | INTRAMUSCULAR | Status: AC
  Administered 2024-05-07: 25 mg via INTRAVENOUS
  Filled 2024-05-07: qty 1

## 2024-05-07 MED ORDER — LACTATED RINGERS IV BOLUS
1000.0000 mL | Freq: Once | INTRAVENOUS | Status: AC
  Administered 2024-05-07: 1000 mL via INTRAVENOUS

## 2024-05-07 MED ORDER — ONDANSETRON HCL 4 MG/2ML IJ SOLN
4.0000 mg | Freq: Once | INTRAMUSCULAR | Status: AC
  Administered 2024-05-07: 4 mg via INTRAVENOUS
  Filled 2024-05-07: qty 2

## 2024-05-07 NOTE — MAU Note (Signed)
 Tracey Ho is a 28 y.o. at [redacted]w[redacted]d here in MAU reporting: migraine for 4 days and unable to keep anything down today. Took zofran  at 2100 and it has not helped with nausea. Ctx have been ongoing, but got stronger today. Denies VB or LOF. +FM. States she took excedrin extra strength around 2100 - vomited and took a goody powder after that.   LMP: NA Onset of complaint: 4 days Pain score: 7 - HA; 7 - ctx  Vitals:   05/07/24 0015  BP: 117/75  Pulse: 73  Resp: 18  Temp: 97.7 F (36.5 C)  SpO2: 99%     FHT: 150  Lab orders placed from triage: UA

## 2024-05-07 NOTE — MAU Provider Note (Signed)
 History     CSN: [id number]  Arrival date and time: 05/06/24 2342   Event Date/Time   First Provider Initiated Contact with Patient 05/07/24 0155      Chief Complaint  Patient presents with   Contractions   Headache   Emesis   Nausea   Tracey Ho is a 28 y.o. H3E6976 at [redacted]w[redacted]d who receives care at [facility].  She presents today for headache.  She states she has a history of migraines and has currently been experiencing one for 4 days.  She reports she usually takes Excedrin and good powder with relief.  However, she reports she took those, today, with no relief. She rates her HA a 6-7/10. She reports also taking Zofran  at 2100, for nausea, but did not have relief.  Patient endorses fetal movement and denies vaginal concerns.  She reports ctx that have been present for a month now, but they got stronger today.    OB History     Gravida  6   Para  3   Term  3   Preterm  0   AB  2   Living  3      SAB  2   IAB  0   Ectopic  0   Multiple  0   Live Births  3           Past Medical History:  Diagnosis Date   ADHD (attention deficit hyperactivity disorder)    Anemia    Depression    Migraine headache    Opioid use disorder 03/27/2022   Oral thrush 12/04/2023   PID (acute pelvic inflammatory disease) 10/22/2020   Rh negative state in antepartum period 07/22/2022   Patient did not have this on her problem list until 36 weeks.   Reviewed chart and realized 07/22/22 that she has not had rhogam     SOB (shortness of breath) 01/02/2024   Unwanted fertility 05/02/2022    Past Surgical History:  Procedure Laterality Date   NERVE SURGERY     NO PAST SURGERIES      Family History  Problem Relation Age of Onset   Hypertension Mother    Thyroid disease Mother    Healthy Father    Diabetes Maternal Aunt    Stroke Maternal Aunt    Hypertension Maternal Aunt    Stroke Maternal Grandmother    Hypertension Maternal Grandmother    Asthma Neg Hx    Cancer Neg  Hx    Birth defects Neg Hx    Heart disease Neg Hx     Social History   Tobacco Use   Smoking status: Every Day    Current packs/day: 0.50    Types: Cigarettes   Smokeless tobacco: Never  Vaping Use   Vaping status: Former   Substances: Nicotine , Flavoring  Substance Use Topics   Alcohol  use: Not Currently   Drug use: Not Currently    Types: Marijuana, Oxycodone     Comment: current oxycodone  use; last marijuana use 5 + years ago    Allergies:  Allergies  Allergen Reactions   Azithromycin  Diarrhea and Nausea And Vomiting    Pt reports she has had azithromycin  on multiple occasions with severe vomiting and diarrhea and stomach cramps always following administration    Medications Prior to Admission  Medication Sig Dispense Refill Last Dose/Taking   Buprenorphine  HCl-Naloxone  HCl (SUBOXONE ) 8-2 MG FILM Place 1 Film under the tongue in the morning, at noon, in the evening, and at bedtime. 120  Film 0 05/06/2024   ondansetron  (ZOFRAN ) 8 MG tablet Take 1 tablet (8 mg total) by mouth every 8 (eight) hours as needed for nausea or vomiting. 40 tablet 2 05/06/2024   Prenatal Vit-Fe Fumarate-FA (MULTIVITAMIN-PRENATAL) 27-0.8 MG TABS tablet Take 1 tablet by mouth daily at 12 noon.   Past Week   valACYclovir  (VALTREX ) 1000 MG tablet Take 1 tablet (1,000 mg) by mouth every 12 hours for 7-10 days for first outbreak. For recurrences, take 1 tablet) daily for 3 days. 30 tablet 3 05/06/2024   fluconazole  (DIFLUCAN ) 150 MG tablet Take 1 tablet (150 mg total) by mouth every three (3) days as needed. 3 tablet 1    naloxone  (NARCAN ) nasal spray 4 mg/0.1 mL Use 1 spray in one nostril. If no response repeat in alternate nostril as needed to reverse opioid overdose. Call 911 (Patient not taking: Reported on 05/04/2024) 2 each 11    nicotine  (NICODERM CQ  - DOSED IN MG/24 HOURS) 14 mg/24hr patch Place 1 patch (14 mg total) onto the skin daily. (Patient not taking: Reported on 04/27/2024) 28 patch 0     ondansetron  (ZOFRAN -ODT) 4 MG disintegrating tablet Dissolve 1 tablet (4 mg total) by mouth every 6 (six) hours as needed for nausea. (Patient not taking: Reported on 04/27/2024) 20 tablet 5    polyethylene glycol powder (GLYCOLAX /MIRALAX ) 17 GM/SCOOP powder Mix 17 grams in beverage and take by mouth daily. (Patient not taking: Reported on 04/27/2024) 476 g 2    valACYclovir  (VALTREX ) 500 MG tablet Take 1 tablet (500 mg total) by mouth 2 (two) times daily. (Patient not taking: Reported on 05/04/2024) 60 tablet 2     Review of Systems  Constitutional:  Negative for chills and fever.  Eyes:  Negative for visual disturbance.  Gastrointestinal:  Positive for abdominal pain (Cramping and tightening), constipation (BM today), nausea and vomiting. Negative for diarrhea.  Genitourinary:  Negative for difficulty urinating, dysuria, vaginal bleeding and vaginal discharge.  Neurological:  Positive for headaches. Negative for dizziness and light-headedness.   Physical Exam   Blood pressure 117/75, pulse 73, temperature 97.7 F (36.5 C), temperature source Oral, resp. rate 18, height 5' 6 (1.676 m), weight 90.2 kg, last menstrual period 08/19/2023, SpO2 99%, not currently breastfeeding.  Physical Exam Vitals reviewed.  Constitutional:      General: She is not in acute distress.    Appearance: Normal appearance. She is well-developed. She is not ill-appearing.  HENT:     Head: Normocephalic and atraumatic.  Eyes:     Conjunctiva/sclera: Conjunctivae normal.  Cardiovascular:     Rate and Rhythm: Normal rate.     Heart sounds: Normal heart sounds.  Pulmonary:     Effort: Pulmonary effort is normal. No respiratory distress.  Abdominal:     Palpations: Abdomen is soft.     Comments: Appears SGA, Soft, NT, No ctx palpated  Musculoskeletal:     Cervical back: Normal range of motion.  Skin:    General: Skin is warm and dry.  Neurological:     Mental Status: She is alert and oriented to person, place,  and time.  Psychiatric:        Mood and Affect: Mood normal.        Behavior: Behavior normal.     Fetal Assessment 125 bpm, Mod Var, -Decels, +Accels Toco: No ctx graphed  MAU Course   Results for orders placed or performed during the hospital encounter of 05/06/24 (from the past 24 hours)  Urinalysis, Routine w  reflex microscopic -Urine, Clean Catch     Status: Abnormal   Collection Time: 05/07/24 12:18 AM  Result Value Ref Range   Color, Urine AMBER (A) YELLOW   APPearance HAZY (A) CLEAR   Specific Gravity, Urine 1.034 (H) 1.005 - 1.030   pH 5.0 5.0 - 8.0   Glucose, UA NEGATIVE NEGATIVE mg/dL   Hgb urine dipstick NEGATIVE NEGATIVE   Bilirubin Urine SMALL (A) NEGATIVE   Ketones, ur 20 (A) NEGATIVE mg/dL   Protein, ur 899 (A) NEGATIVE mg/dL   Nitrite NEGATIVE NEGATIVE   Leukocytes,Ua NEGATIVE NEGATIVE   RBC / HPF 0-5 0 - 5 RBC/hpf   WBC, UA 0-5 0 - 5 WBC/hpf   Bacteria, UA FEW (A) NONE SEEN   Squamous Epithelial / HPF 0-5 0 - 5 /HPF   Mucus PRESENT    Non Squamous Epithelial 0-5 (A) NONE SEEN   No results found.   MDM PE Labs: UA EFM Start IV LR Bolus Antiemetic Assessment and Plan   28 year old [id number]  SIUP at 37 weeks Cat I FT Migraine Ctx Nausea  -Orders placed while patient in triage.  -Headache cocktail complete and patient reports HA now 4/10. -Nausea continues. -Will give zofran  IV and reassess. -Patient requests cervical exam. Will inform nurse.  -Exam performed. -NST reactive. -Monitor and reassess  Harlene LITTIE Duncans MSN, CNM 05/07/2024, 1:55 AM   Reassessment (3:09 AM) -Nurse calls reports fluids complete and patient reports nausea resolved.  -Cervix 1cm. -Nurse to give precautions.  -Keep next appt as scheduled.  -Encouraged to call primary office or return to MAU if symptoms worsen or with the onset of new symptoms. -Discharged to home in improved condition.  Harlene LITTIE Duncans MSN, CNM Advanced Practice Provider, Center for [employer]

## 2024-05-09 LAB — HERPES SIMPLEX VIRUS CULTURE

## 2024-05-10 ENCOUNTER — Ambulatory Visit: Payer: MEDICAID

## 2024-05-10 ENCOUNTER — Other Ambulatory Visit: Payer: MEDICAID

## 2024-05-11 ENCOUNTER — Telehealth: Payer: Self-pay

## 2024-05-11 DIAGNOSIS — O0990 Supervision of high risk pregnancy, unspecified, unspecified trimester: Secondary | ICD-10-CM

## 2024-05-11 DIAGNOSIS — O365930 Maternal care for other known or suspected poor fetal growth, third trimester, not applicable or unspecified: Secondary | ICD-10-CM

## 2024-05-11 NOTE — Telephone Encounter (Signed)
 Patient was not scheduled for future OB appts following visit last week. Called patient to follow up. Patient missed MFM appt yesterday for BPP and doppler. Called MFM but office was closed. Scheduled patient for BPP and routine OB tomorrow AM. Also added appt for REACH clinic next week. Will call MFM tomorrow morning to reschedule doppler studies and antenatal testing next week.

## 2024-05-12 ENCOUNTER — Encounter: Payer: MEDICAID | Admitting: Family Medicine

## 2024-05-12 ENCOUNTER — Other Ambulatory Visit: Payer: MEDICAID

## 2024-05-12 NOTE — Telephone Encounter (Signed)
 MFM does not have availability for doppler studies this week. Next available is Tuesday. They will schedule BPP + dopplers for 05/18/24.

## 2024-05-13 ENCOUNTER — Encounter: Payer: Self-pay | Admitting: Family Medicine

## 2024-05-14 ENCOUNTER — Other Ambulatory Visit (HOSPITAL_COMMUNITY): Payer: Self-pay

## 2024-05-18 ENCOUNTER — Inpatient Hospital Stay (HOSPITAL_COMMUNITY): Admission: AD | Admit: 2024-05-18 | Payer: MEDICAID | Source: Home / Self Care | Admitting: Family Medicine

## 2024-05-18 ENCOUNTER — Ambulatory Visit: Payer: MEDICAID

## 2024-05-18 ENCOUNTER — Encounter: Payer: Self-pay | Admitting: Family Medicine

## 2024-05-18 ENCOUNTER — Other Ambulatory Visit: Payer: Self-pay

## 2024-05-18 ENCOUNTER — Ambulatory Visit (INDEPENDENT_AMBULATORY_CARE_PROVIDER_SITE_OTHER): Payer: MEDICAID | Admitting: Family Medicine

## 2024-05-18 ENCOUNTER — Encounter (HOSPITAL_COMMUNITY): Payer: Self-pay | Admitting: Obstetrics and Gynecology

## 2024-05-18 ENCOUNTER — Inpatient Hospital Stay (HOSPITAL_COMMUNITY)
Admission: AD | Admit: 2024-05-18 | Discharge: 2024-05-21 | DRG: 806 | Disposition: A | Discharge status: Home / Self Care | Payer: MEDICAID | Attending: Obstetrics & Gynecology | Admitting: Obstetrics & Gynecology

## 2024-05-18 ENCOUNTER — Ambulatory Visit: Payer: MEDICAID | Attending: Obstetrics

## 2024-05-18 VITALS — BP 121/81 | HR 67 | Wt 205.2 lb

## 2024-05-18 DIAGNOSIS — Z833 Family history of diabetes mellitus: Secondary | ICD-10-CM

## 2024-05-18 DIAGNOSIS — O365930 Maternal care for other known or suspected poor fetal growth, third trimester, not applicable or unspecified: Principal | ICD-10-CM | POA: Diagnosis present

## 2024-05-18 DIAGNOSIS — O99334 Smoking (tobacco) complicating childbirth: Secondary | ICD-10-CM | POA: Diagnosis present

## 2024-05-18 DIAGNOSIS — O26893 Other specified pregnancy related conditions, third trimester: Secondary | ICD-10-CM | POA: Diagnosis not present

## 2024-05-18 DIAGNOSIS — A6000 Herpesviral infection of urogenital system, unspecified: Secondary | ICD-10-CM

## 2024-05-18 DIAGNOSIS — Z6791 Unspecified blood type, Rh negative: Secondary | ICD-10-CM

## 2024-05-18 DIAGNOSIS — O0990 Supervision of high risk pregnancy, unspecified, unspecified trimester: Secondary | ICD-10-CM

## 2024-05-18 DIAGNOSIS — O99324 Drug use complicating childbirth: Secondary | ICD-10-CM | POA: Diagnosis present

## 2024-05-18 DIAGNOSIS — D509 Iron deficiency anemia, unspecified: Secondary | ICD-10-CM

## 2024-05-18 DIAGNOSIS — F1120 Opioid dependence, uncomplicated: Secondary | ICD-10-CM

## 2024-05-18 DIAGNOSIS — O26899 Other specified pregnancy related conditions, unspecified trimester: Secondary | ICD-10-CM

## 2024-05-18 DIAGNOSIS — O0993 Supervision of high risk pregnancy, unspecified, third trimester: Secondary | ICD-10-CM

## 2024-05-18 DIAGNOSIS — O9832 Other infections with a predominantly sexual mode of transmission complicating childbirth: Secondary | ICD-10-CM | POA: Diagnosis present

## 2024-05-18 DIAGNOSIS — Z8759 Personal history of other complications of pregnancy, childbirth and the puerperium: Secondary | ICD-10-CM

## 2024-05-18 DIAGNOSIS — Z3A38 38 weeks gestation of pregnancy: Secondary | ICD-10-CM

## 2024-05-18 DIAGNOSIS — Z975 Presence of (intrauterine) contraceptive device: Secondary | ICD-10-CM

## 2024-05-18 DIAGNOSIS — F1190 Opioid use, unspecified, uncomplicated: Secondary | ICD-10-CM | POA: Diagnosis present

## 2024-05-18 DIAGNOSIS — O98312 Other infections with a predominantly sexual mode of transmission complicating pregnancy, second trimester: Secondary | ICD-10-CM

## 2024-05-18 DIAGNOSIS — O479 False labor, unspecified: Secondary | ICD-10-CM

## 2024-05-18 DIAGNOSIS — F17210 Nicotine dependence, cigarettes, uncomplicated: Secondary | ICD-10-CM | POA: Diagnosis present

## 2024-05-18 DIAGNOSIS — Z30430 Encounter for insertion of intrauterine contraceptive device: Secondary | ICD-10-CM

## 2024-05-18 DIAGNOSIS — F1110 Opioid abuse, uncomplicated: Secondary | ICD-10-CM | POA: Diagnosis present

## 2024-05-18 DIAGNOSIS — F1290 Cannabis use, unspecified, uncomplicated: Secondary | ICD-10-CM

## 2024-05-18 DIAGNOSIS — O365990 Maternal care for other known or suspected poor fetal growth, unspecified trimester, not applicable or unspecified: Secondary | ICD-10-CM | POA: Diagnosis present

## 2024-05-18 DIAGNOSIS — Z720 Tobacco use: Secondary | ICD-10-CM

## 2024-05-18 DIAGNOSIS — Z8632 Personal history of gestational diabetes: Secondary | ICD-10-CM | POA: Diagnosis present

## 2024-05-18 DIAGNOSIS — F321 Major depressive disorder, single episode, moderate: Secondary | ICD-10-CM

## 2024-05-18 DIAGNOSIS — Z8249 Family history of ischemic heart disease and other diseases of the circulatory system: Secondary | ICD-10-CM

## 2024-05-18 DIAGNOSIS — Z3689 Encounter for other specified antenatal screening: Secondary | ICD-10-CM

## 2024-05-18 DIAGNOSIS — G43009 Migraine without aura, not intractable, without status migrainosus: Secondary | ICD-10-CM

## 2024-05-18 DIAGNOSIS — A568 Sexually transmitted chlamydial infection of other sites: Secondary | ICD-10-CM

## 2024-05-18 MED ORDER — CYCLOBENZAPRINE HCL 5 MG PO TABS
10.0000 mg | ORAL_TABLET | Freq: Once | ORAL | Status: AC
  Administered 2024-05-18: 10 mg via ORAL
  Filled 2024-05-18: qty 2

## 2024-05-18 NOTE — Progress Notes (Signed)
 Subjective:  Tracey Ho is a 28 y.o. [phone] at [redacted]w[redacted]d being seen today for ongoing prenatal care.  She is currently monitored for the following issues for this high-risk pregnancy and has MDD (major depressive disorder), single episode, moderate (HCC); ADHD (attention deficit hyperactivity disorder), combined type; Opioid use disorder; Rh negative state in antepartum period; Recurrent genital herpes; History of suicide attempt; History of gestational hypertension; Anxiety; Hidradenitis suppurativa; Supervision of high risk pregnancy, antepartum; History of gestational diabetes; Chlamydia trachomatis infection in mother during second trimester of pregnancy; Anemia of pregnancy; Genital warts; Iron  deficiency anemia; Migraine without aura and without status migrainosus, not intractable; History of self mutilation; Maternal drug use complicating pregnancy, antepartum; BMI 30.0-30.9,adult; Shortness of breath; Marijuana smoker; Dyspnea; Acute hypoxic respiratory failure (HCC); Continuous dependence on cigarette smoking; Reactive airway disease with acute exacerbation; Tobacco abuse; and IUGR (intrauterine growth restriction) affecting care of mother on their problem list.  Patient reports extreme fatigue, very tired of being pregnant.  Contractions: Irritability. Vag. Bleeding: None.  Movement: Present. Denies leaking of fluid.   The following portions of the patient's history were reviewed and updated as appropriate: allergies, current medications, past family history, past medical history, past social history, past surgical history and problem list. Problem list updated.  Objective:   Vitals:   05/18/24 1633  BP: 121/81  Pulse: 67  Weight: 205 lb 3.2 oz (93.1 kg)    Fetal Status: Fetal Heart Rate (bpm): 140   Movement: Present     General:  Alert, oriented and cooperative. Patient is in no acute distress.  Skin: Skin is warm and dry. No rash noted.   Cardiovascular: Normal heart rate noted   Respiratory: Normal respiratory effort, no problems with respiration noted  Abdomen: Soft, gravid, appropriate for gestational age. Pain/Pressure: Present     Pelvic: Vag. Bleeding: None     Cervical exam performed      NO EXTERNAL LESIONS SEEN ON EXAM  Extremities: Normal range of motion.  Edema: None  Mental Status: Normal mood and affect. Normal behavior. Normal judgment and thought content.   Urinalysis:      PDMP not reviewed this encounter.    Last UDS: Lab Results  Component Value Date   CREATIUR 204 04/27/2024     Assessment and Plan:  Pregnancy: [id number] at [redacted]w[redacted]d  1. Supervision of high risk pregnancy, antepartum (Primary) BP and FHR normal  2. Rh negative state in antepartum period S/p rhogam 04/27/2024  3. Recurrent genital herpes No lesions seen on external exam today Suspicious lesion from last visit was swabbed and was negative for HSV  4. Opioid use disorder Stable on suboxone  8 QID Last UDS appropriate  5. MDD (major depressive disorder), single episode, moderate (HCC) Not addressed  6. Poor fetal growth affecting management of mother in third trimester, single or unspecified fetus Last growth US  05/04/2024 EFW 6%, AC 13%, AFI 9 cm, UAD normal Missed f/u scan today MFM recommended delivery by 38 weeks at last US  Patient extremely over being pregnant and regardless has indication for delivery Accepts direct admission for delivery Communicated to L&D staff, they report no room available, she will go to MAU to await bed  7. Iron  deficiency anemia, unspecified iron  deficiency anemia type Lab Results  Component Value Date   HGB 11.0 (L) 04/27/2024   mild  8. History of gestational hypertension normotensive  Term labor symptoms and general obstetric precautions including but not limited to vaginal bleeding, contractions, leaking of fluid and fetal movement  were reviewed in detail with the patient. Please refer to After Visit Summary for other  counseling recommendations.  Return for ob visit, REACH clinic.  Future Appointments  Date Time Provider Department Center  05/20/2024  7:00 AM [facility] SCHED ROOM [facility] None  06/08/2024  2:35 PM Lola Donnice HERO, MD [facility]     Lola Donnice HERO, MD

## 2024-05-18 NOTE — Patient Instructions (Addendum)
 Go home and get your bag, then go to maternity admissions on the first [facility]. If there is a room available on labor and delivery, you will go straight there, if not they will put you in a room in MAU (the Maternity Assessment Unit) until there is a bed available on labor and delivery.

## 2024-05-18 NOTE — Progress Notes (Signed)
   Subjective:   Tracey Ho is a 28 y.o. [phone] here today for ongoing substance exposed newborn consult.   Health Maintenance Due  Topic Date Due   Hepatitis B Vaccines 19-59 Average Risk (3 of 3 - 3-dose series) 11/26/1999   HPV VACCINES (1 - 3-dose series) Never done   Pneumococcal Vaccine (1 of 2 - PCV) 06/12/2017    Past Medical History:  Diagnosis Date   ADHD (attention deficit hyperactivity disorder)    Anemia    Depression    Migraine headache    Opioid use disorder 03/27/2022   Oral thrush 12/04/2023   PID (acute pelvic inflammatory disease) 10/22/2020   Rh negative state in antepartum period 07/22/2022   Patient did not have this on her problem list until 36 weeks.   Reviewed chart and realized 07/22/22 that she has not had rhogam     SOB (shortness of breath) 01/02/2024   Unwanted fertility 05/02/2022    Past Surgical History:  Procedure Laterality Date   NERVE SURGERY     NO PAST SURGERIES      The following portions of the patient's history were reviewed and updated as appropriate: allergies, current medications, past family history, past medical history, past social history, past surgical history and problem list.     Objective:   [name] is well appearing in no acute distress. She has linear thinking and clear communication.      Assessment and Plan:  Met with Lasharn today at [facility] for ongoing substance exposed newborn consult. We discussed her ongoing prenatal care and recent symptomology of being nauseous/lethargy. Encouraged Ashely to discuss these symptoms with Dr. Zena. Claudene, CNM. She stated she did not feel like these symptoms were related to withdrawal from substances as she has been taking her Subxone as prescribed. However at times she vomits shortly after taking doses.   The REACH team discussed Keairra's wishes following delivery. At present she would like to see/bond with infant and stay for his 3-5 day observation period. She  confirmed that her sister would be taking custody of the infant however not until following discharge from the hospital. She says that sister has paperwork with this plan as well. Historically IUGR noted on ultrasounds and recurrent HSV outbreaks. Expected delivery soon per Dr. Merlynn plan.   Encouraged Jami to contact [facility] consult phone in between appointments or when sent to the hospital for induction.     Problem List Items Addressed This Visit       Genitourinary   Recurrent genital herpes     Other   MDD (major depressive disorder), single episode, moderate (HCC)   Opioid use disorder   Rh negative state in antepartum period   History of gestational hypertension   Supervision of high risk pregnancy, antepartum - Primary   Iron  deficiency anemia   IUGR (intrauterine growth restriction) affecting care of mother    Routine preventative health maintenance measures emphasized. Please refer to After Visit Summary for other counseling recommendations.   Return for ob visit, REACH clinic.    Total face-to-face time with patient: 30 minutes.  Over 50% of encounter was spent on counseling and coordination of care.   Comer Fang, NNP-BC Neonatal Nurse Practitioner Substance Exposed Newborn Consult at the [facility] [phone]

## 2024-05-18 NOTE — MAU Note (Signed)
 Tracey Ho is a 27 y.o. at [redacted]w[redacted]d here in MAU reporting being here for IOL. Pt was to be direct admit but has to come thru MAU due to busyness in L&D. Reports good FM and denies LOF or VB. Does report a h/a which she says she has daily. Also some ctxs  LMP: na Onset of complaint: na Pain score: 7 for ctxs and 8 for h/a Vitals:   05/18/24 2157 05/18/24 2201  BP:  127/74  Pulse: 85   Resp: 18   Temp: 98.1 F (36.7 C)   SpO2: 98%      FHT: 135  Lab orders placed from triage: none

## 2024-05-19 ENCOUNTER — Inpatient Hospital Stay (HOSPITAL_COMMUNITY): Payer: MEDICAID | Admitting: Anesthesiology

## 2024-05-19 ENCOUNTER — Encounter (HOSPITAL_COMMUNITY): Payer: Self-pay | Admitting: Obstetrics and Gynecology

## 2024-05-19 ENCOUNTER — Other Ambulatory Visit: Payer: Self-pay

## 2024-05-19 DIAGNOSIS — F17210 Nicotine dependence, cigarettes, uncomplicated: Secondary | ICD-10-CM | POA: Diagnosis present

## 2024-05-19 DIAGNOSIS — Z30430 Encounter for insertion of intrauterine contraceptive device: Secondary | ICD-10-CM | POA: Diagnosis not present

## 2024-05-19 DIAGNOSIS — O9832 Other infections with a predominantly sexual mode of transmission complicating childbirth: Secondary | ICD-10-CM | POA: Diagnosis present

## 2024-05-19 DIAGNOSIS — A6000 Herpesviral infection of urogenital system, unspecified: Secondary | ICD-10-CM | POA: Diagnosis present

## 2024-05-19 DIAGNOSIS — Z3A38 38 weeks gestation of pregnancy: Secondary | ICD-10-CM

## 2024-05-19 DIAGNOSIS — O99324 Drug use complicating childbirth: Secondary | ICD-10-CM | POA: Diagnosis present

## 2024-05-19 DIAGNOSIS — O99334 Smoking (tobacco) complicating childbirth: Secondary | ICD-10-CM | POA: Diagnosis present

## 2024-05-19 DIAGNOSIS — F1110 Opioid abuse, uncomplicated: Secondary | ICD-10-CM | POA: Diagnosis present

## 2024-05-19 DIAGNOSIS — F1120 Opioid dependence, uncomplicated: Secondary | ICD-10-CM

## 2024-05-19 DIAGNOSIS — Z6791 Unspecified blood type, Rh negative: Secondary | ICD-10-CM | POA: Diagnosis not present

## 2024-05-19 DIAGNOSIS — Z975 Presence of (intrauterine) contraceptive device: Secondary | ICD-10-CM

## 2024-05-19 DIAGNOSIS — Z8249 Family history of ischemic heart disease and other diseases of the circulatory system: Secondary | ICD-10-CM | POA: Diagnosis not present

## 2024-05-19 DIAGNOSIS — Z833 Family history of diabetes mellitus: Secondary | ICD-10-CM | POA: Diagnosis not present

## 2024-05-19 DIAGNOSIS — O26893 Other specified pregnancy related conditions, third trimester: Secondary | ICD-10-CM | POA: Diagnosis present

## 2024-05-19 DIAGNOSIS — O365930 Maternal care for other known or suspected poor fetal growth, third trimester, not applicable or unspecified: Secondary | ICD-10-CM | POA: Diagnosis present

## 2024-05-19 LAB — TYPE AND SCREEN
ABO/RH(D): O NEG
Antibody Screen: POSITIVE

## 2024-05-19 LAB — CBC
HCT: 35.1 % — ABNORMAL LOW (ref 36.0–46.0)
Hemoglobin: 11.8 g/dL — ABNORMAL LOW (ref 12.0–15.0)
MCH: 29.6 pg (ref 26.0–34.0)
MCHC: 33.6 g/dL (ref 30.0–36.0)
MCV: 88 fL (ref 80.0–100.0)
Platelets: 184 K/uL (ref 150–400)
RBC: 3.99 MIL/uL (ref 3.87–5.11)
RDW: 12.8 % (ref 11.5–15.5)
WBC: 5.3 K/uL (ref 4.0–10.5)
nRBC: 0 % (ref 0.0–0.2)

## 2024-05-19 LAB — RPR: RPR Ser Ql: NONREACTIVE

## 2024-05-19 MED ORDER — TRANEXAMIC ACID-NACL 1000-0.7 MG/100ML-% IV SOLN
1000.0000 mg | INTRAVENOUS | Status: AC
  Administered 2024-05-19: 1000 mg via INTRAVENOUS

## 2024-05-19 MED ORDER — EPHEDRINE 5 MG/ML INJ: 10.0000 mg | INTRAVENOUS | Status: DC | PRN

## 2024-05-19 MED ORDER — FENTANYL CITRATE (PF) 100 MCG/2ML IJ SOLN
INTRAMUSCULAR | Status: DC | PRN
  Administered 2024-05-19: 100 ug via EPIDURAL

## 2024-05-19 MED ORDER — FENTANYL CITRATE (PF) 100 MCG/2ML IJ SOLN
50.0000 ug | INTRAMUSCULAR | Status: DC | PRN
  Administered 2024-05-19 (×2): 100 ug via INTRAVENOUS
  Filled 2024-05-19 (×3): qty 2

## 2024-05-19 MED ORDER — ACETAMINOPHEN 325 MG PO TABS
650.0000 mg | ORAL_TABLET | ORAL | Status: DC | PRN
Start: 2024-05-19 — End: 2024-05-19

## 2024-05-19 MED ORDER — LEVONORGESTREL 20 MCG/DAY IU IUD
1.0000 | INTRAUTERINE_SYSTEM | Freq: Once | INTRAUTERINE | Status: AC
Start: 2024-05-19 — End: 2024-05-19
  Administered 2024-05-19: 1 via INTRAUTERINE
  Filled 2024-05-19: qty 1

## 2024-05-19 MED ORDER — TERBUTALINE SULFATE 1 MG/ML IJ SOLN
0.2500 mg | Freq: Once | INTRAMUSCULAR | Status: DC | PRN
  Filled 2024-05-19: qty 1

## 2024-05-19 MED ORDER — ONDANSETRON HCL 4 MG/2ML IJ SOLN
4.0000 mg | Freq: Four times a day (QID) | INTRAMUSCULAR | Status: DC | PRN
Start: 2024-05-19 — End: 2024-05-19

## 2024-05-19 MED ORDER — BUPIVACAINE HCL (PF) 0.25 % IJ SOLN
INTRAMUSCULAR | Status: DC | PRN
Start: 2024-05-19 — End: 2024-05-19
  Administered 2024-05-19: 3 mL via EPIDURAL

## 2024-05-19 MED ORDER — TRANEXAMIC ACID-NACL 1000-0.7 MG/100ML-% IV SOLN
INTRAVENOUS | Status: AC
  Filled 2024-05-19: qty 100

## 2024-05-19 MED ORDER — BUPIVACAINE HCL (PF) 0.25 % IJ SOLN
INTRAMUSCULAR | Status: DC | PRN
  Administered 2024-05-19: 1.6 mL via INTRATHECAL

## 2024-05-19 MED ORDER — OXYTOCIN-SODIUM CHLORIDE 30-0.9 UT/500ML-% IV SOLN: 2.5000 [IU]/h | INTRAVENOUS | Status: DC

## 2024-05-19 MED ORDER — OXYTOCIN BOLUS FROM INFUSION
333.0000 mL | Freq: Once | INTRAVENOUS | Status: AC
  Administered 2024-05-19: 333 mL via INTRAVENOUS

## 2024-05-19 MED ORDER — OXYCODONE-ACETAMINOPHEN 5-325 MG PO TABS: 1.0000 | ORAL_TABLET | ORAL | Status: DC | PRN

## 2024-05-19 MED ORDER — LACTATED RINGERS IV SOLN: 500.0000 mL | INTRAVENOUS | Status: DC | PRN

## 2024-05-19 MED ORDER — BUPRENORPHINE HCL-NALOXONE HCL 8-2 MG SL SUBL: 1.0000 | SUBLINGUAL_TABLET | Freq: Three times a day (TID) | SUBLINGUAL | Status: DC

## 2024-05-19 MED ORDER — LACTATED RINGERS IV SOLN
500.0000 mL | Freq: Once | INTRAVENOUS | Status: AC
  Administered 2024-05-19: 500 mL via INTRAVENOUS

## 2024-05-19 MED ORDER — LIDOCAINE HCL (PF) 1 % IJ SOLN: 30.0000 mL | INTRAMUSCULAR | Status: DC | PRN

## 2024-05-19 MED ORDER — LACTATED RINGERS AMNIOINFUSION: INTRAVENOUS | Status: DC

## 2024-05-19 MED ORDER — TERBUTALINE SULFATE 1 MG/ML IJ SOLN: 0.2500 mg | Freq: Once | INTRAMUSCULAR | Status: DC | PRN

## 2024-05-19 MED ORDER — BUPRENORPHINE HCL-NALOXONE HCL 8-2 MG SL SUBL
1.0000 | SUBLINGUAL_TABLET | Freq: Four times a day (QID) | SUBLINGUAL | Status: DC
  Filled 2024-05-19: qty 1

## 2024-05-19 MED ORDER — LACTATED RINGERS IV SOLN: INTRAVENOUS | Status: DC

## 2024-05-19 MED ORDER — BUPIVACAINE HCL (PF) 0.25 % IJ SOLN: INTRAMUSCULAR | Status: DC | PRN

## 2024-05-19 MED ORDER — OXYTOCIN-SODIUM CHLORIDE 30-0.9 UT/500ML-% IV SOLN
1.0000 m[IU]/min | INTRAVENOUS | Status: DC
  Administered 2024-05-19: 2 m[IU]/min via INTRAVENOUS
  Filled 2024-05-19: qty 500

## 2024-05-19 MED ORDER — ACETAMINOPHEN-CAFFEINE 500-65 MG PO TABS
2.0000 | ORAL_TABLET | Freq: Once | ORAL | Status: AC
  Administered 2024-05-19: 2 via ORAL
  Filled 2024-05-19: qty 2

## 2024-05-19 MED ORDER — ASPIRIN-ACETAMINOPHEN-CAFFEINE 250-250-65 MG PO TABS: 2.0000 | ORAL_TABLET | Freq: Once | ORAL | Status: DC

## 2024-05-19 MED ORDER — BUPRENORPHINE HCL-NALOXONE HCL 8-2 MG SL FILM
8.0000 mg | ORAL_FILM | Freq: Four times a day (QID) | SUBLINGUAL | Status: DC
  Administered 2024-05-19: 1 via SUBLINGUAL
  Filled 2024-05-19 (×2): qty 1

## 2024-05-19 MED ORDER — HYDROXYZINE HCL 50 MG PO TABS
50.0000 mg | ORAL_TABLET | Freq: Four times a day (QID) | ORAL | Status: DC | PRN
Start: 2024-05-19 — End: 2024-05-19

## 2024-05-19 MED ORDER — PHENYLEPHRINE 80 MCG/ML (10ML) SYRINGE FOR IV PUSH (FOR BLOOD PRESSURE SUPPORT): 80.0000 ug | PREFILLED_SYRINGE | INTRAVENOUS | Status: DC | PRN

## 2024-05-19 MED ORDER — SOD CITRATE-CITRIC ACID 500-334 MG/5ML PO SOLN: 30.0000 mL | ORAL | Status: DC | PRN

## 2024-05-19 MED ORDER — FENTANYL-BUPIVACAINE-NACL 0.5-0.125-0.9 MG/250ML-% EP SOLN
12.0000 mL/h | EPIDURAL | Status: DC | PRN
  Administered 2024-05-19: 12 mL/h via EPIDURAL
  Filled 2024-05-19: qty 250

## 2024-05-19 MED ORDER — OXYTOCIN-SODIUM CHLORIDE 30-0.9 UT/500ML-% IV SOLN: 1.0000 m[IU]/min | INTRAVENOUS | Status: DC

## 2024-05-19 MED ORDER — OXYCODONE-ACETAMINOPHEN 5-325 MG PO TABS: 2.0000 | ORAL_TABLET | ORAL | Status: DC | PRN

## 2024-05-19 MED ORDER — DIPHENHYDRAMINE HCL 50 MG/ML IJ SOLN: 12.5000 mg | INTRAMUSCULAR | Status: DC | PRN

## 2024-05-19 NOTE — Discharge Summary (Signed)
 Postpartum Discharge Summary  Date of Service updated***     Patient Name: Tracey Ho DOB: July 21, 1996 MRN: [id number]  Date of admission: 05/18/2024 Delivery date:05/19/2024 Delivering provider: LONA MERRITTS Date of discharge: 05/19/2024  Admitting diagnosis: IUGR (intrauterine growth restriction) affecting care of mother [O36.5990] Intrauterine pregnancy: [redacted]w[redacted]d     Secondary diagnosis:  Principal Problem:   IUGR (intrauterine growth restriction) affecting care of mother Active Problems:   Opioid use disorder   Rh negative state in antepartum period   Recurrent genital herpes   History of gestational hypertension   History of gestational diabetes   Pregnancy complicated by subutex  maintenance, antepartum (HCC)   Encounter for insertion of intrauterine contraceptive device (IUD)  Additional problems: none    Discharge diagnosis: Term Pregnancy Delivered                                              Post partum procedures:postplacental IUD placed; Rhogam *** Augmentation: AROM and Pitocin  Complications: None  Hospital course: Induction of Labor With Vaginal Delivery   28 y.o. yo [phone] at [redacted]w[redacted]d was admitted to the hospital 05/18/2024 for induction of labor.  Indication for induction: FGR. Patient had an uncomplicated labor course. Membrane Rupture Time/Date: 3:00 PM,05/19/2024  Delivery Method:Vaginal, Spontaneous Operative Delivery:N/A Episiotomy: None Lacerations:  None Details of delivery can be found in separate delivery note.  Patient had a postpartum course complicated by***. Patient is discharged home 05/19/24.  Newborn Data: Birth date:05/19/2024 Birth time:8:51 PM Gender:Female Living status:Living Apgars:8 ,8  Weight:2590 g (5lb 11.4oz)  Magnesium  Sulfate received: No BMZ received: No Rhophylac :Yes *** MMR:N/A T-DaP:declined prenatally Flu: N/A RSV Vaccine received: No Transfusion:No  Immunizations received: Immunization History  Administered  Date(s) Administered   DTaP 09/27/1998, 02/13/1999, 04/25/1999, 03/28/2000, 06/23/2002   H1N1 10/25/2008   HIB, Unspecified 09/27/1998, 02/13/1999, 04/25/1999, 10/01/1999   Hepatitis B, PED/ADOLESCENT 02/13/1999, 10/01/1999   IPV 09/27/1998, 02/13/1999, 04/25/1999, 06/23/2002   Influenza Nasal 07/31/2009   Influenza-Unspecified 06/28/2011, 10/09/2012, 08/06/2013   MMR 10/01/1999, 06/23/2002   Meningococcal Acwy, Unspecified 07/31/2009   PFIZER Comirnaty(Gray Top)Covid-19 Tri-Sucrose Vaccine 12/28/2020   PFIZER(Purple Top)SARS-COV-2 Vaccination 10/31/2020   Pneumococcal Conjugate,unspecified 10/01/1999, 03/28/2000   Rho (D) Immune Globulin  08/18/2012, 08/18/2012, 02/11/2015, 07/12/2016, 01/31/2022, 08/09/2022   Tdap 06/16/2009, 02/12/2015   Varicella 10/01/1999, 02/20/2006    Physical exam  Vitals:   05/19/24 2054 05/19/24 2102 05/19/24 2132 05/19/24 2147  BP: 108/70 117/79 129/75 125/78  Pulse: (!) 57 (!) 59 (!) 42 (!) 43  Resp:      Temp:      TempSrc:      SpO2:      Weight:      Height:       General: {Exam; general:21111117} Lochia: {Desc; appropriate/inappropriate:30686::appropriate} Uterine Fundus: {Desc; firm/soft:30687} Incision: {Exam; incision:21111123} DVT Evaluation: {Exam; dvt:2111122} Labs: Lab Results  Component Value Date   WBC 5.3 05/18/2024   HGB 11.8 (L) 05/18/2024   HCT 35.1 (L) 05/18/2024   MCV 88.0 05/18/2024   PLT 184 05/18/2024      Latest Ref Rng & Units 04/27/2024    2:14 PM  CMP  Glucose 70 - 99 mg/dL 98   BUN 6 - 20 mg/dL 5   Creatinine 9.42 - 8.99 mg/dL 9.42   Sodium 865 - 855 mmol/L 134   Potassium 3.5 - 5.2 mmol/L 3.8   Chloride 96 -  106 mmol/L 104   CO2 20 - 29 mmol/L 18   Calcium 8.7 - 10.2 mg/dL 8.7   Total Protein 6.0 - 8.5 g/dL 6.3   Total Bilirubin 0.0 - 1.2 mg/dL 0.5   Alkaline Phos 44 - 121 IU/L 127   AST 0 - 40 IU/L 10   ALT 0 - 32 IU/L 5    Edinburgh Score:    10/17/2022    8:33 AM  Edinburgh Postnatal  Depression Scale Screening Tool  I have been able to laugh and see the funny side of things. 0  I have looked forward with enjoyment to things. 0  I have blamed myself unnecessarily when things went wrong. 2  I have been anxious or worried for no good reason. 0  I have felt scared or panicky for no good reason. 0  Things have been getting on top of me. 0  I have been so unhappy that I have had difficulty sleeping. 2  I have felt sad or miserable. 2  I have been so unhappy that I have been crying. 2  The thought of harming myself has occurred to me. 0  Edinburgh Postnatal Depression Scale Total 8      Data saved with a previous flowsheet row definition   No data recorded  After visit meds:  Allergies as of 05/19/2024       Reactions   Azithromycin  Diarrhea, Nausea And Vomiting   Pt reports she has had azithromycin  on multiple occasions with severe vomiting and diarrhea and stomach cramps always following administration     Med Rec must be completed prior to using this SMARTLINK***        Discharge home in stable condition Infant Feeding: {Baby feeding:23562} Infant Disposition:{CHL IP OB HOME WITH FNUYZM:76418} Discharge instruction: per After Visit Summary and Postpartum booklet. Activity: Advance as tolerated. Pelvic rest for 6 weeks.  Diet: routine diet Future Appointments: Future Appointments  Date Time Provider Department Center  06/08/2024  2:35 PM Lola Donnice HERO, MD [facility]   Follow up Visit: Loreli Suzen BIRCH, CNM  P [facility] Admin Pool Please schedule this patient for Postpartum visit in: 6 weeks with the following provider: [facility] In-Person For C/S patients schedule nurse incision check in weeks 2 weeks: no High risk pregnancy complicated by: FGR; suboxone  use Delivery mode:  SVD Anticipated Birth Control:  PP IUD Placed PP Procedures needed: trim IUD strings Schedule Integrated BH visit: no    05/19/2024 Suzen BIRCH Loreli, CNM

## 2024-05-19 NOTE — Progress Notes (Signed)
 LABOR PROGRESS NOTE  Tracey Ho is a 28 y.o. [phone] at [redacted]w[redacted]d  admitted for IOL for IUGR.  Subjective: Starting to feel contractions since starting pitocin  Wanting to discuss pain management options  Objective: BP (!) 105/54   Pulse 62   Temp 97.9 F (36.6 C) (Oral)   Resp 18   Ht 5' 6 (1.676 m)   Wt 93 kg   LMP 08/19/2023 (Within Days)   SpO2 98%   BMI 33.09 kg/m  or  Vitals:   05/18/24 2157 05/18/24 2201 05/19/24 0355 05/19/24 0948  BP:  127/74 115/65 (!) 105/54  Pulse: 85  (!) 51 62  Resp: 18  16 18   Temp: 98.1 F (36.7 C)  98.6 F (37 C) 97.9 F (36.6 C)  TempSrc:   Oral Oral  SpO2: 98%     Weight: 93 kg     Height: 5' 6 (1.676 m)       Physical Exam Vitals reviewed.  Constitutional:      General: She is not in acute distress.    Appearance: She is well-developed. She is not diaphoretic.  Cardiovascular:     Rate and Rhythm: Normal rate and regular rhythm.     Heart sounds: Normal heart sounds. No murmur heard. Pulmonary:     Effort: Pulmonary effort is normal. No respiratory distress.     Breath sounds: Normal breath sounds. No wheezing or rales.  Abdominal:     General: Bowel sounds are normal. There is no distension.     Palpations: Abdomen is soft.     Tenderness: There is no abdominal tenderness. There is no guarding or rebound.  Skin:    General: Skin is warm and dry.  Neurological:     Mental Status: She is alert.     Coordination: Coordination normal.     Dilation: 3 Effacement (%): 50 Station: -3 Presentation: Vertex Exam by:: Tracey Oatis MD  FHT Baseline: 130 bpm Variability: Good {> 6 bpm) Accelerations: Reactive Decelerations: Early Uterine activity: irregular Cat: I  Labs: Lab Results  Component Value Date   WBC 5.3 05/18/2024   HGB 11.8 (L) 05/18/2024   HCT 35.1 (L) 05/18/2024   MCV 88.0 05/18/2024   PLT 184 05/18/2024    Patient Active Problem List   Diagnosis Date Noted   IUGR (intrauterine growth restriction)  affecting care of mother 02/22/2024   Tobacco abuse 01/03/2024   Shortness of breath 01/02/2024   Dyspnea 01/02/2024   Acute hypoxic respiratory failure (HCC) 01/02/2024   Continuous dependence on cigarette smoking 01/02/2024   Reactive airway disease with acute exacerbation 01/02/2024   Iron  deficiency anemia 12/09/2023   Maternal drug use complicating pregnancy, antepartum 12/09/2023   Chlamydia trachomatis infection in mother during second trimester of pregnancy 12/01/2023   Anemia of pregnancy 12/01/2023   Supervision of high risk pregnancy, antepartum 11/18/2023   Hidradenitis suppurativa 03/25/2023   Anxiety 02/18/2023   History of gestational hypertension 10/17/2022   Rh negative state in antepartum period 07/22/2022   Opioid use disorder 03/27/2022   History of gestational diabetes 01/11/2022   Genital warts 04/16/2021   Recurrent genital herpes 10/22/2020   Migraine without aura and without status migrainosus, not intractable 10/28/2017   BMI 30.0-30.9,adult 11/20/2015   History of suicide attempt 10/09/2012   History of self mutilation 10/09/2012   MDD (major depressive disorder), single episode, moderate (HCC) 08/04/2012   ADHD (attention deficit hyperactivity disorder), combined type 08/04/2012   Marijuana smoker 08/04/2012    Assessment /  Plan: 28 y.o. [id number] at [redacted]w[redacted]d here for IOL for IUGR.  Labor: AROM for small amount of meconium stained fluid. On pitocin  already, continue to titrate.  Fetal Wellbeing:  Cat I Pain Control:  discussed all options are available to her regardless of OUD treated with suboxone . Can have IV pain meds, nitrous, epidural, etc. Ultimately planning an epidural, encouraged not to wait too long.  GBS: negative Anticipated MOD:  NSVD  Rh neg: rhogam eval after delivery  HSV: no lesions on exam  OUD: stable on 8 QID, prefers films to tablets, hospital only stocks tablets. Plan to use home suboxone  while admitted.   Tracey CHRISTELLA Carolus,  MD/MPH Attending Family Medicine Physician, [facility] for [facility], [facility]   05/19/2024, 3:09 PM

## 2024-05-19 NOTE — H&P (Signed)
 LABOR AND DELIVERY ADMISSION HISTORY AND PHYSICAL NOTE  Tracey Ho is a 28 y.o. female [phone] with IUP at [redacted]w[redacted]d presenting for HA, irregular contractions. Known IUGR with MFM recommendation for IOL 38-39 weeks. Elected to stay for IOL.   Patient reports the fetal movement as active. Patient reports uterine contraction activity as every 30 minutes. Patient reports  vaginal bleeding as none. Patient describes fluid per vagina as None.   Patient has HA. States this is the same as her daily HA. Denies vision changes, RUQ pain, CP, SOB.  She plans on bottle feeding. Her contraception plan is: interval BTL.  Prenatal History/Complications: PNC at [facility] - OUD on suboxone  (oxycodone  use at beginning of pregnancy. Transitioned to suboxone  early 2nd trimester) - IUGR - HSV with outbreak during third trimester - Rh neg - Chlamydia during pregnancy - Migraines - Tobacco and marijuana use  Sono:  @[redacted]w[redacted]d , CWD, normal anatomy, cephalic presentation, posterior placenta, 6%ile, EFW 2371g  Pregnancy complications:  Patient Active Problem List   Diagnosis Date Noted   IUGR (intrauterine growth restriction) affecting care of mother 02/22/2024   Tobacco abuse 01/03/2024   Shortness of breath 01/02/2024   Dyspnea 01/02/2024   Acute hypoxic respiratory failure (HCC) 01/02/2024   Continuous dependence on cigarette smoking 01/02/2024   Reactive airway disease with acute exacerbation 01/02/2024   Iron  deficiency anemia 12/09/2023   Maternal drug use complicating pregnancy, antepartum 12/09/2023   Chlamydia trachomatis infection in mother during second trimester of pregnancy 12/01/2023   Anemia of pregnancy 12/01/2023   Supervision of high risk pregnancy, antepartum 11/18/2023   Hidradenitis suppurativa 03/25/2023   Anxiety 02/18/2023   History of gestational hypertension 10/17/2022   Rh negative state in antepartum period 07/22/2022   Opioid use disorder 03/27/2022   History of gestational  diabetes 01/11/2022   Genital warts 04/16/2021   Recurrent genital herpes 10/22/2020   Migraine without aura and without status migrainosus, not intractable 10/28/2017   BMI 30.0-30.9,adult 11/20/2015   History of suicide attempt 10/09/2012   History of self mutilation 10/09/2012   MDD (major depressive disorder), single episode, moderate (HCC) 08/04/2012   ADHD (attention deficit hyperactivity disorder), combined type 08/04/2012   Marijuana smoker 08/04/2012    Past Medical History: Past Medical History:  Diagnosis Date   ADHD (attention deficit hyperactivity disorder)    Anemia    Depression    Migraine headache    Opioid use disorder 03/27/2022   Oral thrush 12/04/2023   PID (acute pelvic inflammatory disease) 10/22/2020   Rh negative state in antepartum period 07/22/2022   Patient did not have this on her problem list until 36 weeks.   Reviewed chart and realized 07/22/22 that she has not had rhogam     SOB (shortness of breath) 01/02/2024   Unwanted fertility 05/02/2022    Past Surgical History: Past Surgical History:  Procedure Laterality Date   NERVE SURGERY     NO PAST SURGERIES      Obstetrical History: OB History     Gravida  6   Para  3   Term  3   Preterm  0   AB  2   Living  3      SAB  2   IAB  0   Ectopic  0   Multiple  0   Live Births  3           Social History: Social History   Socioeconomic History   Marital status: Single  Spouse name: Not on file   Number of children: Not on file   Years of education: Not on file   Highest education level: Not on file  Occupational History   Occupation: Student    Comment: 8th grade at [employer]  Tobacco Use   Smoking status: Every Day    Current packs/day: 0.50    Types: Cigarettes   Smokeless tobacco: Never  Vaping Use   Vaping status: Former   Substances: Nicotine , Flavoring  Substance and Sexual Activity   Alcohol  use: Not Currently   Drug use: Not Currently     Types: Marijuana, Oxycodone     Comment: current oxycodone  use; last marijuana use 5 + years ago   Sexual activity: Yes    Partners: Male    Birth control/protection: None  Other Topics Concern   Not on file  Social History Narrative   Not on file   Social Drivers of Health   Financial Resource Strain: Medium Risk (08/08/2022)   Received from [facility]   Overall Financial Resource Strain (CARDIA)    Difficulty of Paying Living Expenses: Somewhat hard  Food Insecurity: No Food Insecurity (01/03/2024)   Hunger Vital Sign    Worried About Running Out of Food in the Last Year: Never true    Ran Out of Food in the Last Year: Never true  Transportation Needs: No Transportation Needs (01/03/2024)   PRAPARE - Administrator, Civil Service (Medical): No    Lack of Transportation (Non-Medical): No  Physical Activity: Inactive (08/29/2021)   Received from [facility]   Exercise Vital Sign    On average, how many days per week do you engage in moderate to strenuous exercise (like a brisk walk)?: 0 days    On average, how many minutes do you engage in exercise at this level?: 0 min  Stress: No Stress Concern Present (08/08/2022)   Received from [facility] - Occupational Stress Questionnaire    Feeling of Stress : Only a little  Social Connections: Unknown (01/21/2022)   [facility] from [facility]   Social Network    Social Network: Not on file    Family History: Family History  Problem Relation Age of Onset   Hypertension Mother    Thyroid disease Mother    Healthy Father    Diabetes Maternal Aunt    Stroke Maternal Aunt    Hypertension Maternal Aunt    Stroke Maternal Grandmother    Hypertension Maternal Grandmother    Asthma Neg Hx    Cancer Neg Hx    Birth defects Neg Hx    Heart disease Neg Hx     Allergies: Allergies  Allergen Reactions   Azithromycin  Diarrhea and Nausea And Vomiting    Pt reports she has  had azithromycin  on multiple occasions with severe vomiting and diarrhea and stomach cramps always following administration    Medications Prior to Admission  Medication Sig Dispense Refill Last Dose/Taking   Buprenorphine  HCl-Naloxone  HCl (SUBOXONE ) 8-2 MG FILM Place 1 Film under the tongue in the morning, at noon, in the evening, and at bedtime. 120 Film 0 05/18/2024   ondansetron  (ZOFRAN ) 8 MG tablet Take 1 tablet (8 mg total) by mouth every 8 (eight) hours as needed for nausea or vomiting. 40 tablet 2 05/18/2024   valACYclovir  (VALTREX ) 1000 MG tablet Take 1 tablet (1,000 mg) by mouth every 12 hours for 7-10 days for first outbreak. For recurrences, take 1  tablet) daily for 3 days. 30 tablet 3 05/18/2024   naloxone  (NARCAN ) nasal spray 4 mg/0.1 mL Use 1 spray in one nostril. If no response repeat in alternate nostril as needed to reverse opioid overdose. Call 911 (Patient not taking: Reported on 05/04/2024) 2 each 11    nicotine  (NICODERM CQ  - DOSED IN MG/24 HOURS) 14 mg/24hr patch Place 1 patch (14 mg total) onto the skin daily. (Patient not taking: Reported on 04/27/2024) 28 patch 0    polyethylene glycol powder (GLYCOLAX /MIRALAX ) 17 GM/SCOOP powder Mix 17 grams in beverage and take by mouth daily. (Patient not taking: Reported on 04/27/2024) 476 g 2    Prenatal Vit-Fe Fumarate-FA (MULTIVITAMIN-PRENATAL) 27-0.8 MG TABS tablet Take 1 tablet by mouth daily at 12 noon.        Review of Systems  All systems reviewed and negative except as stated in HPI  Physical Exam BP 115/65 (BP Location: Right Arm)   Pulse (!) 51   Temp 98.6 F (37 C) (Oral)   Resp 16   Ht 5' 6 (1.676 m)   Wt 93 kg   LMP 08/19/2023 (Within Days)   SpO2 98%   BMI 33.09 kg/m   Physical Exam Constitutional:      General: She is not in acute distress.    Appearance: She is not ill-appearing.  Cardiovascular:     Rate and Rhythm: Normal rate.  Pulmonary:     Effort: Pulmonary effort is normal.  Abdominal:      Comments: Gravid  Genitourinary:    General: Normal vulva.     Rectum: Normal.  Musculoskeletal:        General: No swelling.  Skin:    General: Skin is warm and dry.  Neurological:     General: No focal deficit present.  Psychiatric:        Mood and Affect: Mood normal.   Presentation: cephalic by BSUS  Fetal monitoring: Baseline: 125 bpm, Variability: Good {> 6 bpm), Accelerations: Reactive, and Decelerations: Absent Uterine activity: None  Patient did not tolerate cervical exam  Prenatal labs: ABO, Rh: --/--/O NEG (08/26 2235) Antibody: POS (08/26 2235) Rubella: 3.18 (03/04 1613) RPR: Non Reactive (06/10 1617)  HBsAg: Negative (03/04 1613)  HIV: Non Reactive (06/10 1617)  GC/Chlamydia:  Neisseria Gonorrhea  Date Value Ref Range Status  04/13/2024 Negative  Final   Chlamydia  Date Value Ref Range Status  04/13/2024 Negative  Final   GBS: Negative/-- (08/05 1642)   Prenatal Transfer Tool  Maternal Diabetes: No Genetic Screening: Normal Maternal Ultrasounds/Referrals: IUGR Fetal Ultrasounds or other Referrals:  Referred to Materal Fetal Medicine  Maternal Substance Abuse:  Yes:  Type: Smoker, Marijuana, Other:  oxycodone  Significant Maternal Medications: suboxone  Significant Maternal Lab Results: Group B Strep negative  Results for orders placed or performed during the hospital encounter of 05/18/24 (from the past 24 hours)  CBC   Collection Time: 05/18/24 10:35 PM  Result Value Ref Range   WBC 5.3 4.0 - 10.5 K/uL   RBC 3.99 3.87 - 5.11 MIL/uL   Hemoglobin 11.8 (L) 12.0 - 15.0 g/dL   HCT 64.8 (L) 63.9 - 53.9 %   MCV 88.0 80.0 - 100.0 fL   MCH 29.6 26.0 - 34.0 pg   MCHC 33.6 30.0 - 36.0 g/dL   RDW 87.1 88.4 - 84.4 %   Platelets 184 150 - 400 K/uL   nRBC 0.0 0.0 - 0.2 %  Type and screen [facility]   Collection Time:  05/18/24 10:35 PM  Result Value Ref Range   ABO/RH(D) O NEG    Antibody Screen POS    Sample Expiration 05/21/2024,2359     Antibody Identification      PASSIVELY ACQUIRED ANTI-D Performed at [facility], [address], [place], [place] [address]     Assessment: Nerida Boivin is a 28 y.o. [phone] at [redacted]w[redacted]d here for IOL for IUGR  #Labor: Will reassess cervical check once on L&D #Pain: IV pain meds PRN, epidural upon request #FHT: Category I #GBS/ID: Negative #MOF: bottle feeding #MOC: interval BTL #Circ: Yes  #OUD: Continue home suboxone  8-2 QID #IUGR: current Cat I tracing #Recurrent HSV: on valtrex . No prodromal symptoms. Treated for outbreak in 3rd trimester and has healed. Full SSE upon arrival to L&D #Tobacco/marijuana use #Rh neg: rhogam assessment PP  Almarie CHRISTELLA Moats, MD [facility] for [facility], [facility]  05/19/2024, 6:28 AM

## 2024-05-19 NOTE — MAU Note (Signed)
 Pt informed that the ultrasound is considered a limited OB ultrasound and is not intended to be a complete ultrasound exam.  Patient also informed that the ultrasound is not being completed with the intent of assessing for fetal or placental anomalies or any pelvic abnormalities.  Explained that the purpose of today's ultrasound is to assess for presentation only.   Patient acknowledges the purpose of the exam and the limitations of the study.  AT time of this u/s baby is vertex

## 2024-05-19 NOTE — Procedures (Signed)
  Post-Placental IUD Insertion Procedure Note  Patient identified, informed consent signed prior to delivery, signed copy in chart, time out was performed.    Vaginal, labial and perineal areas thoroughly inspected for lacerations. No laceration identified.  - Mirena  IUD grasped between sterile gloved fingers. Sterile lubrication applied to sterile gloved hand for ease of insertion. Fundus identified through abdominal wall using non-insertion hand. IUD inserted to fundus with bimanual technique. IUD carefully released at the fundus and insertion hand gently removed from vagina.    Strings trimmed to the level of the introitus. Patient tolerated procedure well.  Lot # [id number] Expiration Date: Oct 2027  Patient given post procedure instructions and IUD care card with expiration date.  Patient is asked to keep IUD strings tucked in her vagina until her postpartum follow up visit in 4-6 weeks. Patient advised to abstain from sexual intercourse and pulling on strings before her follow-up visit. Patient verbalized an understanding of the plan of care and agrees.

## 2024-05-19 NOTE — Progress Notes (Signed)
 Labor Progress Note Tracey Ho is a 28 y.o. [phone] at [redacted]w[redacted]d presented for IOL for IUGR. Pregnancy also c/b HSV on valtrex  with 2 active outbreaks during pregnancy; chronic OUD on suboxone  8mg  TID-QID  S: Patient is comfortable with epidural  O:  BP 122/75   Pulse (!) 57   Temp 97.8 F (36.6 C) (Oral)   Resp 16   Ht 5' 6 (1.676 m)   Wt 93 kg   LMP 08/19/2023 (Within Days)   SpO2 100%   BMI 33.09 kg/m  EFM: 110 baseline/moderate variablity/+ recurrent variable decels  CVE: Dilation: 6.5 Effacement (%): 80 Station: -1 Presentation: Vertex Exam by:: [name], RN   A&P: 28 y.o. [id number] [redacted]w[redacted]d  IOL for IUGR s/p pitocin  and AROM #Labor: DC pit and placed IUPC for amnio-infusion given recurrent variable decels #Pain: epidural #FWB: \category 2 #GBS negative #HSV- currently on valtrex  #Suboxone - continue regimen; encourage at least partial breastfeeding to reduce withdrawal in neonate   Coolidge Moros, MD 8:48 PM

## 2024-05-19 NOTE — Anesthesia Procedure Notes (Signed)
 Epidural Patient location during procedure: OB Start time: 05/19/2024 5:25 PM End time: 05/19/2024 5:35 PM  Staffing Anesthesiologist: Niels Marien CROME, MD Performed: anesthesiologist   Preanesthetic Checklist Completed: patient identified, IV checked, risks and benefits discussed, monitors and equipment checked, pre-op evaluation and timeout performed  Epidural Patient position: sitting Prep: DuraPrep and site prepped and draped Patient monitoring: continuous pulse ox, blood pressure, heart rate and cardiac monitor Approach: midline Location: L3-L4 Injection technique: LOR air  Needle:  Needle type: Tuohy  Needle gauge: 17 G Needle length: 9 cm Needle insertion depth: 6 cm Catheter type: closed end flexible Catheter size: 19 Gauge Catheter at skin depth: 11 cm Test dose: negative  Assessment Sensory level: T8 Events: blood not aspirated, no cerebrospinal fluid, injection not painful, no injection resistance, no paresthesia and negative IV test  Additional Notes Patient identified. Risks/Benefits/Options discussed with patient including but not limited to bleeding, infection, nerve damage, paralysis, failed block, incomplete pain control, headache, blood pressure changes, nausea, vomiting, reactions to medication both or allergic, itching and postpartum back pain. Confirmed with bedside nurse the patient's most recent platelet count. Confirmed with patient that they are not currently taking any anticoagulation, have any bleeding history or any family history of bleeding disorders. Patient expressed understanding and wished to proceed. All questions were answered. Sterile technique was used throughout the entire procedure. Please see nursing notes for vital signs. Test dose was given through epidural catheter and negative prior to continuing to dose epidural or start infusion. Warning signs of high block given to the patient including shortness of breath, tingling/numbness in hands,  complete motor block, or any concerning symptoms with instructions to call for help. Patient was given instructions on fall risk and not to get out of bed. All questions and concerns addressed with instructions to call with any issues or inadequate analgesia.    CSE performed 2/2 advanced labor. LOR at 6cm. 25g pencan inserted into intrathecal space with return of clear CSF. Epidural catheter inserted into epidural space and left at 11cm at skin. Pt tolerated well. No complications. VSS. Reason for block:procedure for pain

## 2024-05-19 NOTE — Anesthesia Preprocedure Evaluation (Addendum)
 Anesthesia Evaluation  Patient identified by MRN, date of birth, ID band Patient awake    Reviewed: Allergy & Precautions, NPO status , Patient's Chart, lab work & pertinent test results  Airway Mallampati: II  TM Distance: >3 FB Neck ROM: Full    Dental no notable dental hx.    Pulmonary shortness of breath, Current Smoker and Patient abstained from smoking.   Pulmonary exam normal breath sounds clear to auscultation       Cardiovascular negative cardio ROS Normal cardiovascular exam Rhythm:Regular Rate:Normal     Neuro/Psych  Headaches PSYCHIATRIC DISORDERS Anxiety Depression       GI/Hepatic negative GI ROS,,,(+)     substance abuse (on suboxone )  marijuana use  Endo/Other  negative endocrine ROS    Renal/GU negative Renal ROS  negative genitourinary   Musculoskeletal negative musculoskeletal ROS (+)  narcotic dependent  Abdominal   Peds  (+) ADHD Hematology negative hematology ROS (+)   Anesthesia Other Findings IOL for IUGR  Reproductive/Obstetrics (+) Pregnancy                              Anesthesia Physical Anesthesia Plan  ASA: 3  Anesthesia Plan: Epidural   Post-op Pain Management:    Induction:   PONV Risk Score and Plan: Treatment may vary due to age or medical condition  Airway Management Planned: Natural Airway  Additional Equipment:   Intra-op Plan:   Post-operative Plan:   Informed Consent: I have reviewed the patients History and Physical, chart, labs and discussed the procedure including the risks, benefits and alternatives for the proposed anesthesia with the patient or authorized representative who has indicated his/her understanding and acceptance.       Plan Discussed with: Anesthesiologist  Anesthesia Plan Comments: (Patient identified. Risks, benefits, options discussed with patient including but not limited to bleeding, infection, nerve  damage, paralysis, failed block, incomplete pain control, headache, blood pressure changes, nausea, vomiting, reactions to medication, itching, and post partum back pain. Confirmed with bedside nurse the patient's most recent platelet count. Confirmed with the patient that they are not taking any anticoagulation, have any bleeding history or any family history of bleeding disorders. Patient expressed understanding and wishes to proceed. All questions were answered. )         Anesthesia Quick Evaluation

## 2024-05-20 ENCOUNTER — Inpatient Hospital Stay (HOSPITAL_COMMUNITY): Payer: MEDICAID

## 2024-05-20 MED ORDER — RHO D IMMUNE GLOBULIN 1500 UNIT/2ML IJ SOSY
300.0000 ug | PREFILLED_SYRINGE | Freq: Once | INTRAMUSCULAR | Status: DC
  Filled 2024-05-20: qty 2

## 2024-05-20 MED ORDER — DIPHENHYDRAMINE HCL 25 MG PO CAPS: 25.0000 mg | ORAL_CAPSULE | Freq: Four times a day (QID) | ORAL | Status: DC | PRN

## 2024-05-20 MED ORDER — IBUPROFEN 600 MG PO TABS
600.0000 mg | ORAL_TABLET | Freq: Four times a day (QID) | ORAL | Status: DC
  Administered 2024-05-20 – 2024-05-21 (×3): 600 mg via ORAL
  Filled 2024-05-20 (×3): qty 1

## 2024-05-20 MED ORDER — GABAPENTIN 100 MG PO CAPS
300.0000 mg | ORAL_CAPSULE | Freq: Two times a day (BID) | ORAL | Status: DC
  Administered 2024-05-20 – 2024-05-21 (×3): 300 mg via ORAL
  Filled 2024-05-20 (×3): qty 3

## 2024-05-20 MED ORDER — OXYCODONE HCL 5 MG PO TABS
5.0000 mg | ORAL_TABLET | ORAL | Status: DC | PRN
  Administered 2024-05-20: 5 mg via ORAL
  Filled 2024-05-20: qty 1

## 2024-05-20 MED ORDER — SIMETHICONE 80 MG PO CHEW: 80.0000 mg | CHEWABLE_TABLET | ORAL | Status: DC | PRN

## 2024-05-20 MED ORDER — IBUPROFEN 600 MG PO TABS
600.0000 mg | ORAL_TABLET | Freq: Four times a day (QID) | ORAL | Status: DC
  Administered 2024-05-20 (×2): 600 mg via ORAL
  Filled 2024-05-20 (×3): qty 1

## 2024-05-20 MED ORDER — SENNOSIDES-DOCUSATE SODIUM 8.6-50 MG PO TABS
2.0000 | ORAL_TABLET | ORAL | Status: DC
  Administered 2024-05-20 – 2024-05-21 (×2): 2 via ORAL
  Filled 2024-05-20 (×2): qty 2

## 2024-05-20 MED ORDER — KETOROLAC TROMETHAMINE 30 MG/ML IJ SOLN
30.0000 mg | Freq: Four times a day (QID) | INTRAMUSCULAR | Status: DC
  Filled 2024-05-20: qty 1

## 2024-05-20 MED ORDER — ONDANSETRON HCL 4 MG/2ML IJ SOLN: 4.0000 mg | INTRAMUSCULAR | Status: DC | PRN

## 2024-05-20 MED ORDER — WITCH HAZEL-GLYCERIN EX PADS: 1.0000 | MEDICATED_PAD | CUTANEOUS | Status: DC | PRN

## 2024-05-20 MED ORDER — ONDANSETRON HCL 4 MG PO TABS
4.0000 mg | ORAL_TABLET | ORAL | Status: DC | PRN
  Administered 2024-05-20 (×2): 4 mg via ORAL
  Filled 2024-05-20 (×2): qty 1

## 2024-05-20 MED ORDER — TETANUS-DIPHTH-ACELL PERTUSSIS 5-2.5-18.5 LF-MCG/0.5 IM SUSY: 0.5000 mL | PREFILLED_SYRINGE | Freq: Once | INTRAMUSCULAR | Status: DC

## 2024-05-20 MED ORDER — IBUPROFEN 600 MG PO TABS: 600.0000 mg | ORAL_TABLET | Freq: Four times a day (QID) | ORAL | Status: DC

## 2024-05-20 MED ORDER — DIBUCAINE (PERIANAL) 1 % EX OINT: 1.0000 | TOPICAL_OINTMENT | CUTANEOUS | Status: DC | PRN

## 2024-05-20 MED ORDER — MEASLES, MUMPS & RUBELLA VAC IJ SOLR: 0.5000 mL | Freq: Once | INTRAMUSCULAR | Status: DC

## 2024-05-20 MED ORDER — ACETAMINOPHEN 325 MG PO TABS
650.0000 mg | ORAL_TABLET | ORAL | Status: DC | PRN
  Administered 2024-05-20 – 2024-05-21 (×6): 650 mg via ORAL
  Filled 2024-05-20 (×6): qty 2

## 2024-05-20 MED ORDER — COCONUT OIL OIL: 1.0000 | TOPICAL_OIL | Status: DC | PRN

## 2024-05-20 MED ORDER — KETOROLAC TROMETHAMINE 30 MG/ML IJ SOLN: 30.0000 mg | Freq: Four times a day (QID) | INTRAMUSCULAR | Status: DC

## 2024-05-20 MED ORDER — ZOLPIDEM TARTRATE 5 MG PO TABS: 5.0000 mg | ORAL_TABLET | Freq: Every evening | ORAL | Status: DC | PRN

## 2024-05-20 MED ORDER — OXYCODONE HCL 5 MG PO TABS
10.0000 mg | ORAL_TABLET | Freq: Four times a day (QID) | ORAL | Status: DC | PRN
  Administered 2024-05-20 – 2024-05-21 (×5): 10 mg via ORAL
  Filled 2024-05-20 (×5): qty 2

## 2024-05-20 MED ORDER — BENZOCAINE-MENTHOL 20-0.5 % EX AERO: 1.0000 | INHALATION_SPRAY | CUTANEOUS | Status: DC | PRN

## 2024-05-20 MED ORDER — PRENATAL MULTIVITAMIN CH
1.0000 | ORAL_TABLET | Freq: Every day | ORAL | Status: DC
  Administered 2024-05-20: 1 via ORAL
  Filled 2024-05-20 (×2): qty 1

## 2024-05-20 NOTE — Progress Notes (Addendum)
 POSTPARTUM PROGRESS NOTE  Post Partum Day 1  Subjective:  Tracey Ho is a 28 y.o. [id number] s/p NSVD at [redacted]w[redacted]d.  No acute events overnight.  Pt denies problems with voiding, and has been able to ambulate independently.  She reports nausea but no vomiting.  Pain is poorly controlled.  She has not had flatus. She has not had bowel movement.  Lochia Moderate.   Objective: Blood pressure 123/85, pulse (!) 59, temperature 98 F (36.7 C), temperature source Axillary, resp. rate 18, height 5' 6 (1.676 m), weight 93 kg, last menstrual period 08/19/2023, SpO2 100%, unknown if currently breastfeeding.  Physical Exam:  General: alert, cooperative and no distress Chest: no respiratory distress Heart:regular rate, distal pulses intact Abdomen: soft, nontender,  Uterine Fundus: firm, appropriately tender DVT Evaluation: No calf swelling or tenderness Extremities: No edema Skin: warm, dry  Recent Labs    05/18/24 2235  HGB 11.8*  HCT 35.1*    Assessment/Plan: Tracey Ho is a 28 y.o. H3E5975 s/p NSVD at [redacted]w[redacted]d   PPD#1 - Doing well Contraception: post-placental IUD was placed Feeding: formula feeding Circumcision: r/b/a discussion was had and patient consented to procedure. Consent note in baby's chart Dispo: Plan for discharge likely 8/29.  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

## 2024-05-20 NOTE — Progress Notes (Signed)
 Pt is sleeping and had requested to not be disturbed. RN will return to do her next 4hr check at a later time.

## 2024-05-20 NOTE — Lactation Note (Signed)
 This note was copied from a baby's chart. Lactation Consultation Note  Patient Name: Tracey Ho Unijb'd Date: 05/20/2024 Age:26 hours   Mom is choosing to formula feed.   Maternal Data    Feeding Nipple Type: Slow - flow  LATCH Score                    Lactation Tools Discussed/Used    Interventions    Discharge    Consult Status Consult Status: Complete    Ryanne Morand G 05/20/2024, 4:50 AM

## 2024-05-20 NOTE — Anesthesia Postprocedure Evaluation (Signed)
 Anesthesia Post Note  Patient: Marketing executive  Procedure(s) Performed: AN AD HOC LABOR EPIDURAL     Patient location during evaluation: Mother Baby Anesthesia Type: Epidural Level of consciousness: awake and alert Pain management: pain level controlled Vital Signs Assessment: post-procedure vital signs reviewed and stable Respiratory status: spontaneous breathing, nonlabored ventilation and respiratory function stable Cardiovascular status: stable Postop Assessment: no headache, no backache and epidural receding Anesthetic complications: no   No notable events documented.  Last Vitals:  Vitals:   05/20/24 0030 05/20/24 0400  BP: 128/84 123/85  Pulse: (!) 59 (!) 59  Resp: 18 18  Temp: 36.6 C 36.7 C  SpO2: 100% 100%    Last Pain:  Vitals:   05/20/24 0630  TempSrc:   PainSc: 8    Pain Goal: Patients Stated Pain Goal: 0 (05/19/24 1332)                 Shan Padgett

## 2024-05-21 ENCOUNTER — Other Ambulatory Visit (HOSPITAL_COMMUNITY): Payer: Self-pay

## 2024-05-21 MED ORDER — ACETAMINOPHEN 325 MG PO TABS
650.0000 mg | ORAL_TABLET | ORAL | 0 refills | Status: DC | PRN
  Filled 2024-05-21: qty 30, 3d supply, fill #0

## 2024-05-21 MED ORDER — IBUPROFEN 600 MG PO TABS
600.0000 mg | ORAL_TABLET | Freq: Four times a day (QID) | ORAL | 0 refills | Status: AC
  Filled 2024-05-21: qty 30, 8d supply, fill #0

## 2024-05-21 MED ORDER — SENNOSIDES-DOCUSATE SODIUM 8.6-50 MG PO TABS
2.0000 | ORAL_TABLET | ORAL | 0 refills | Status: DC
  Filled 2024-05-21: qty 30, 15d supply, fill #0

## 2024-05-21 MED ORDER — LIDOCAINE 5 % EX PTCH
1.0000 | MEDICATED_PATCH | CUTANEOUS | Status: DC
  Administered 2024-05-21: 1 via TRANSDERMAL
  Filled 2024-05-21 (×2): qty 1

## 2024-05-21 NOTE — Progress Notes (Signed)
 Patient reporting vaginal burning. Patient seen and evaluated. Examined with Abigail RN chaperone. Area of concern on left labia without any lesions, ulcers or abrasions. No signs of herpes and patient also had a negative examination upon admission with no prodromal symptoms and was on valtrex . Symptoms most likely related to recent vaginal delivery causing sensitivity in this area. Continue to monitor symptoms and report any new or worsening symptoms.  Patient also reporting legs feeling weak but able to ambulate without issue. Denies numbness/tingling. May be due to recovery s/p epidural. Symptoms should continue to improve. Report any new or worsening symptoms.  [name], MD, FACOG Obstetrician & Gynecologist, [facility] for [facility]

## 2024-05-21 NOTE — Clinical Social Work Maternal (Signed)
 CLINICAL SOCIAL WORK MATERNAL/CHILD NOTE  Patient Details  Name: Tracey Ho MRN: [id number] Date of Birth: [birth year]  Date:  05/21/2024  Clinical Social Worker Initiating Note:  Eliazar Luisdavid Hamblin Date/Time: Initiated:  05/21/24/1130     Child's Name:  Tracey Ho 05/19/2024   Biological Parents:  Mother Tracey Ho 1996/08/03)   Need for Interpreter:  None   Reason for Referral:  Current Substance Use/Substance Use During Pregnancy     Address:  [address]  [place] [address]    Phone number:  [phone] (home)     Additional phone number:   Household Members/Support Persons (HM/SP):   Household Member/Support Person 1, Household Member/Support Person 2, Household Member/Support Person 3   HM/SP Name Relationship DOB or Age  HM/SP -1 Daleen Finder son 11/17/02023  HM/SP -2 Parin Clinton Daughter 07/12/2016  HM/SP -3 Za'Novia Roets daughter 02/10/2015  HM/SP -4        HM/SP -5        HM/SP -6        HM/SP -7        HM/SP -8          Natural Supports (not living in the home):  Extended Family   Professional Supports: Other (Comment) ([facility])   Employment: Full-time   Type of Work: [employer]   Education:  [employer] school graduate   Homebound arranged: No  Financial Resources:  [employer]   Other Resources:  Sales executive  , [employer]   Cultural/Religious Considerations Which May Impact Care:    Strengths:  Ability to meet basic needs  , Home prepared for child  , Pediatrician chosen, Psychotropic Medications   Psychotropic Medications:  Suboxone       Pediatrician:    [facility] (including [place])  Pediatrician List:   [facility] [place]    [facility]    Pediatrician Fax Number:    Risk Factors/Current Problems:  Substance Use  , Mental Health Concerns     Cognitive State:  Able to Concentrate  , Alert      Mood/Affect:  Calm  , Comfortable     CSW Assessment: CSW received a consult for hx of OUD, anxiety and depression. CSW met with MOB to complete assessment and offer support. CSW entered the room and observed MOB resting in bed holding the infant and her sister at bedside. CSW introduced self, CSW role and reason for visit, MOB was agreeable to visit and allowed her sister to remain in the room. CSW inquired about how MOB was feeling, MOB reported good. CSW confirmed MOB demographic info, MOB reported her current address is [address] [place], [place] [address] and the number on file is correct. CSW inquired about MOB MH hx, MOB reported she was diagnosed in 2015, CSW inquired about treatment, MOB reported none, CSW inquired about MOB copes with her symptoms, MOB stated I don't. CSW assisted MOB in exploring ways to cope with MH symptoms and provided resources.  CSW  assessed or safety, MOB denied any SI of HI, MOB denied any SI or HI. CSW provided education regarding the baby blues period vs. perinatal mood disorders, discussed treatment and gave resources for mental health follow up if concerns arise.  CSW recommends self-evaluation during the postpartum time period using the New Mom Checklist from Postpartum Progress and encouraged MOB to contact a medical professional if symptoms are  noted at any time.  MOB identified her sister at her support. CSW inquired about MOB placing the infant  with her sister, MOB reported that is still the plan but they are going to handle that process outside of the hospital, infant will discharge to [facility].   CSW inquired about MOB OUD, MOB reported she started in Suboxone  around April and that was her last use of Oxycodone , CSW inquired about most recent positive drug scree, MOB stated  I'm not sure why, I have only been taking my suboxone .  CSW informed MOB of the hospital drug screen policy, CSW informed MOB infant UDS was negative and CDS was pending, MOB verbalized  understanding. CSW inquired about CPS hx, MOB denied CPS hx.   CSW provided review of Sudden Infant Death Syndrome (SIDS) precautions.  MOB reported she has all essential items for the infant including a bassinet crib and car seat.  CSW identifies no further need for intervention and no barriers to discharge at this time.  CSW Plan/Description:  No Further Intervention Required/No Barriers to Discharge, Sudden Infant Death Syndrome (SIDS) Education, Perinatal Mood and Anxiety Disorder (PMADs) Education, Neonatal Abstinence Syndrome (NAS) Education, CSW Will Continue to Monitor Umbilical Cord Tissue Drug Screen Results and Make Report if [facility] Drug Screen Policy Information    Eliazar CHRISTELLA Gave, [place] 05/21/2024, 3:09 PM

## 2024-05-21 NOTE — Patient Instructions (Signed)
 If interested in an outpatient lactation consult in office or virtually please reach out to us  at [facility] (First Floor) [address], [place]  Please call [phone] and press 4 for lactation.    Lactation support groups:  [facility], Tuesdays 10:00 am -12:00 pm at [address] on the second floor in the conference room, lactating parents and lap babies welcome.  [email or web address]  [email or web address]   Geraldina Louder, [facility] Center for [facility]

## 2024-05-22 LAB — RH IG WORKUP (INCLUDES ABO/RH)
Fetal Screen: NEGATIVE
Gestational Age(Wks): 38.5
Unit division: 0

## 2024-05-25 ENCOUNTER — Ambulatory Visit: Payer: MEDICAID | Admitting: Advanced Practice Midwife

## 2024-06-01 ENCOUNTER — Other Ambulatory Visit: Payer: Self-pay

## 2024-06-01 ENCOUNTER — Other Ambulatory Visit (HOSPITAL_COMMUNITY): Payer: Self-pay

## 2024-06-01 ENCOUNTER — Ambulatory Visit (INDEPENDENT_AMBULATORY_CARE_PROVIDER_SITE_OTHER): Payer: MEDICAID | Admitting: Advanced Practice Midwife

## 2024-06-01 ENCOUNTER — Encounter: Payer: Self-pay | Admitting: Family Medicine

## 2024-06-01 VITALS — BP 144/107 | HR 76

## 2024-06-01 DIAGNOSIS — F1120 Opioid dependence, uncomplicated: Secondary | ICD-10-CM

## 2024-06-01 DIAGNOSIS — H60391 Other infective otitis externa, right ear: Secondary | ICD-10-CM | POA: Diagnosis not present

## 2024-06-01 DIAGNOSIS — O99325 Drug use complicating the puerperium: Secondary | ICD-10-CM

## 2024-06-01 DIAGNOSIS — Z5181 Encounter for therapeutic drug level monitoring: Secondary | ICD-10-CM

## 2024-06-01 DIAGNOSIS — Z79891 Long term (current) use of opiate analgesic: Secondary | ICD-10-CM

## 2024-06-01 DIAGNOSIS — O165 Unspecified maternal hypertension, complicating the puerperium: Secondary | ICD-10-CM

## 2024-06-01 MED ORDER — BUPRENORPHINE HCL-NALOXONE HCL 8-2 MG SL FILM
1.0000 | ORAL_FILM | Freq: Four times a day (QID) | SUBLINGUAL | 0 refills | Status: DC
  Filled 2024-06-01: qty 120, 30d supply, fill #0

## 2024-06-01 MED ORDER — MUPIROCIN 2 % EX OINT
1.0000 | TOPICAL_OINTMENT | Freq: Two times a day (BID) | CUTANEOUS | 0 refills | Status: AC
  Filled 2024-06-01: qty 22, 11d supply, fill #0

## 2024-06-01 MED ORDER — AMLODIPINE BESYLATE 10 MG PO TABS
10.0000 mg | ORAL_TABLET | Freq: Every day | ORAL | 1 refills | Status: DC
  Filled 2024-06-01: qty 30, 30d supply, fill #0

## 2024-06-01 NOTE — Progress Notes (Unsigned)
 Tracey Ho is a 28 y.o. [phone] here today for 2 week postpartum checkup after vaginal delivery. PP IUD placed. Plans to continue IUD and NOT get tubal ligation.   BP newly elevated today. Mild HA's that resolve w./ IBU. No vision changes or epigastric pain. Not on BP meds. Reports sewlling and hands and face. Starting to pee a lot.   Health Maintenance Due  Topic Date Due   Hepatitis B Vaccines 19-59 Average Risk (3 of 3 - 3-dose series) 11/26/1999   HPV VACCINES (1 - 3-dose series) Never done   Pneumococcal Vaccine (1 of 2 - PCV) 06/12/2017    Past Medical History:  Diagnosis Date   ADHD (attention deficit hyperactivity disorder)    Anemia    Depression    Migraine headache    Opioid use disorder 03/27/2022   Oral thrush 12/04/2023   PID (acute pelvic inflammatory disease) 10/22/2020   Rh negative state in antepartum period 07/22/2022   Patient did not have this on her problem list until 36 weeks.   Reviewed chart and realized 07/22/22 that she has not had rhogam     SOB (shortness of breath) 01/02/2024   Unwanted fertility 05/02/2022    Past Surgical History:  Procedure Laterality Date   NERVE SURGERY     NO PAST SURGERIES      The following portions of the patient's history were reviewed and updated as appropriate: allergies, current medications, past family history, past medical history, past social history, past surgical history and problem list.   Health Maintenance:   Last pap:  Result Date Procedure Results Follow-ups  03/27/2022 Cytology - PAP( [place]) Neisseria Gonorrhea: Negative Chlamydia: Negative Trichomonas: Negative Adequacy: Satisfactory for evaluation; transformation zone component PRESENT. Diagnosis: - Negative for intraepithelial lesion or malignancy (NILM) Comment: Normal Reference Ranger Chlamydia - Negative Comment: Normal Reference Range Neisseria Gonorrhea - Negative Comment: Normal Reference Range Trichomonas - Negative     Hep A  Immunization: deferred to future visit  Hep B Immunization: deferred to future visit  Last LFTs: Lab Results  Component Value Date   ALT 5 04/27/2024   AST 10 04/27/2024   ALKPHOS 127 (H) 04/27/2024   BILITOT 0.5 04/27/2024     Review of Systems:  Pertinent items noted in HPI and remainder of comprehensive ROS otherwise negative.  Physical Exam:  BP (!) 150/116 BP (!) 144/107   Pulse 76   LMP 08/19/2023 (Within Days)   Breastfeeding No   CONSTITUTIONAL: Well-developed, well-nourished female in no acute distress.  HEENT:  Normocephalic, atraumatic. External right and left ear normal. No scleral icterus.  NECK: Normal range of motion, supple, no masses noted on observation SKIN: No rash noted. Not diaphoretic. No erythema. No pallor. MUSCULOSKELETAL: Normal range of motion. 2+ bilat pitting pedal edema noted. NEUROLOGIC: Alert and oriented to person, place, and time. Normal muscle tone coordination. DTRs 2+ PSYCHIATRIC: Normal mood and affect. Normal behavior. Normal judgment and thought content. RESPIRATORY: Effort normal, no problems with respiration noted ABDOMEN: Deferred PELVIC: Not indicated  Labs and Imaging PDMP not reviewed this encounter.   Last UDS: Lab Results  Component Value Date   CREATIUR 204 04/27/2024    No results found for this or any previous visit (from the past week).   Assessment and Plan:  1. Pregnancy complicated by subutex  maintenance, antepartum (HCC) (Primary) - ToxAssure Flex 15, Ur  2. Postpartum hypertension 1 severe range BP and repeat BP mildly elevated. Has had HA's. None now.  Discussed need for Pre-E eval. Reasonable to do labs in office today.  - Return precautions reviewed. Go to MAU for Sx severe Pre-E.  BP check in 3 days (can't come in 2 days.)  - CBC - Comp Met (CMET) - Protein / creatinine ratio, urine - amLODipine  (NORVASC ) 10 MG tablet; Take 1 tablet (10 mg total) by mouth daily.  Dispense: 30 tablet; Refill:  1  3. Encounter for monitoring Suboxone  maintenance therapy - ToxAssure Flex 15, Ur - Buprenorphine  HCl-Naloxone  HCl (SUBOXONE ) 8-2 MG FILM; Place 1 Film under the tongue in the morning, at noon, in the evening, and at bedtime.  Dispense: 120 Film; Refill: 0  4. Skin of right earlobe with infection - mupirocin  ointment (BACTROBAN ) 2 %; Apply 1 Application topically 2 (two) times daily.  Dispense: 22 g; Refill: 0   Return in about 3 weeks (around 06/22/2024) for Blood Pressure check 06/04/24, REACH Postpartum appointment in 3 weeks .   Future Appointments  Date Time Provider Department Center  06/29/2024  1:35 PM Lola Donnice HERO, MD [facility]    Total face-to-face time with patient: 20 minutes.  Over 50% of encounter was spent on counseling and coordination of care.   Varick Keys  Claudene HOWARD 06/01/2024 4:35 PM Center for [employer], [facility]

## 2024-06-01 NOTE — Progress Notes (Unsigned)
   Subjective:   Tracey Ho is a 28 y.o. [phone] here today for ongoing substance exposed newborn consult and REACH clinic collaboration.   Health Maintenance Due  Topic Date Due   Hepatitis B Vaccines 19-59 Average Risk (3 of 3 - 3-dose series) 11/26/1999   HPV VACCINES (1 - 3-dose series) Never done   Pneumococcal Vaccine (1 of 2 - PCV) 06/12/2017    Past Medical History:  Diagnosis Date   ADHD (attention deficit hyperactivity disorder)    Anemia    Depression    Migraine headache    Opioid use disorder 03/27/2022   Oral thrush 12/04/2023   PID (acute pelvic inflammatory disease) 10/22/2020   Rh negative state in antepartum period 07/22/2022   Patient did not have this on her problem list until 36 weeks.   Reviewed chart and realized 07/22/22 that she has not had rhogam     SOB (shortness of breath) 01/02/2024   Unwanted fertility 05/02/2022    Past Surgical History:  Procedure Laterality Date   NERVE SURGERY     NO PAST SURGERIES      The following portions of the patient's history were reviewed and updated as appropriate: allergies, current medications, past family history, past medical history, past social history, past surgical history and problem list.     Objective:   Tracey Ho is well appearing in no acute distress. She has linear thinking and clear communication.      Assessment and Plan:  Met with Tracey Ho and her 56 year old son today at [facility] for ongoing substance exposed newborn consult. We discussed her delivery and lived experiences at the hospital. Tracey Ho shared that her infant (Tracey Ho) is currently at [facility] for poor weight gain and feeding intervention. Tracey Ho stated that she opted to go to [facility] after experiences with her care at [facility]. Celebrated that Tracey Ho came to clinic today and continues to stay within her treatment regimen despite increased stressors. At present Tracey Ho has chosen to allow her sister to raise Tracey Ho,  however would like to continue to have legal custody of infant.   Encouraged Tracey Ho to contact [facility] consult phone in between appointments with any further questions or concerns.     Problem List Items Addressed This Visit   None   Routine preventative health maintenance measures emphasized. Please refer to After Visit Summary for other counseling recommendations.   No follow-ups on file.    Total face-to-face time with patient: 30 minutes.  Over 50% of encounter was spent on counseling and coordination of care.   Comer Fang, NNP-BC Neonatal Nurse Practitioner Substance Exposed Newborn Consult at the [facility] [phone]

## 2024-06-02 ENCOUNTER — Ambulatory Visit: Payer: Self-pay | Admitting: Advanced Practice Midwife

## 2024-06-02 LAB — CBC
Hematocrit: 39.6 % (ref 34.0–46.6)
Hemoglobin: 12.9 g/dL (ref 11.1–15.9)
MCH: 29.1 pg (ref 26.6–33.0)
MCHC: 32.6 g/dL (ref 31.5–35.7)
MCV: 89 fL (ref 79–97)
Platelets: 239 x10E3/uL (ref 150–450)
RBC: 4.44 x10E6/uL (ref 3.77–5.28)
RDW: 12.5 % (ref 11.7–15.4)
WBC: 3.8 x10E3/uL (ref 3.4–10.8)

## 2024-06-02 LAB — COMPREHENSIVE METABOLIC PANEL WITH GFR
ALT: 12 IU/L (ref 0–32)
AST: 15 IU/L (ref 0–40)
Albumin: 3.8 g/dL — ABNORMAL LOW (ref 4.0–5.0)
Alkaline Phosphatase: 104 IU/L (ref 44–121)
BUN/Creatinine Ratio: 11 (ref 9–23)
BUN: 9 mg/dL (ref 6–20)
Bilirubin Total: 1.1 mg/dL (ref 0.0–1.2)
CO2: 24 mmol/L (ref 20–29)
Calcium: 9.4 mg/dL (ref 8.7–10.2)
Chloride: 104 mmol/L (ref 96–106)
Creatinine, Ser: 0.8 mg/dL (ref 0.57–1.00)
Globulin, Total: 3.2 g/dL (ref 1.5–4.5)
Glucose: 76 mg/dL (ref 70–99)
Potassium: 4.6 mmol/L (ref 3.5–5.2)
Sodium: 141 mmol/L (ref 134–144)
Total Protein: 7 g/dL (ref 6.0–8.5)
eGFR: 105 mL/min/1.73 (ref >59)

## 2024-06-04 ENCOUNTER — Telehealth: Payer: Self-pay | Admitting: Lactation Services

## 2024-06-04 NOTE — Telephone Encounter (Signed)
 Called patient to ask her to go to MAU for evaluation ASAP. Patient did not answer. LM for her to go to the hospital now. My chart message was sent also.

## 2024-06-08 ENCOUNTER — Ambulatory Visit: Payer: MEDICAID | Admitting: Family Medicine

## 2024-06-08 LAB — TOXASSURE FLEX 15, UR
6-ACETYLMORPHINE IA: NEGATIVE ng/mL
7-aminoclonazepam: NOT DETECTED ng/mg{creat}
AMPHETAMINES IA: NEGATIVE ng/mL
Alpha-hydroxyalprazolam: NOT DETECTED ng/mg{creat}
Alpha-hydroxymidazolam: NOT DETECTED ng/mg{creat}
Alpha-hydroxytriazolam: NOT DETECTED ng/mg{creat}
Alprazolam: NOT DETECTED ng/mg{creat}
BARBITURATES IA: NEGATIVE ng/mL
BUPRENORPHINE: POSITIVE
Benzodiazepines: NEGATIVE
Buprenorphine: 290 ng/mg{creat}
COCAINE METABOLITE IA: NEGATIVE ng/mL
Clonazepam: NOT DETECTED ng/mg{creat}
Creatinine: 49 mg/dL (ref >=20)
Desalkylflurazepam: NOT DETECTED ng/mg{creat}
Desmethyldiazepam: NOT DETECTED ng/mg{creat}
Desmethylflunitrazepam: NOT DETECTED ng/mg{creat}
Diazepam: NOT DETECTED ng/mg{creat}
ETHYL ALCOHOL Enzymatic: NEGATIVE g/dL
FENTANYL: NEGATIVE
Fentanyl: NOT DETECTED ng/mg{creat}
Flunitrazepam: NOT DETECTED ng/mg{creat}
Lorazepam: NOT DETECTED ng/mg{creat}
METHADONE IA: NEGATIVE ng/mL
METHADONE MTB IA: NEGATIVE ng/mL
Midazolam: NOT DETECTED ng/mg{creat}
Norbuprenorphine: 1094 ng/mg{creat}
Norfentanyl: NOT DETECTED ng/mg{creat}
OPIATE CLASS IA: NEGATIVE ng/mL
OXYCODONE CLASS IA: NEGATIVE ng/mL
Oxazepam: NOT DETECTED ng/mg{creat}
PHENCYCLIDINE IA: NEGATIVE ng/mL
TAPENTADOL, IA: NEGATIVE ng/mL
TRAMADOL IA: NEGATIVE ng/mL
Temazepam: NOT DETECTED ng/mg{creat}

## 2024-06-08 LAB — PROTEIN / CREATININE RATIO, URINE
Creatinine, Urine: 48.5 mg/dL
Protein, Ur: 5.2 mg/dL
Protein/Creat Ratio: 107 mg/g{creat} (ref 0–200)

## 2024-06-08 LAB — CANNABINOIDS, MS, UR RFX
Cannabinoids Confirmation: POSITIVE
Carboxy-THC: 20 ng/mg{creat}

## 2024-06-21 ENCOUNTER — Encounter: Payer: Self-pay | Admitting: Family Medicine

## 2024-06-21 DIAGNOSIS — Z79891 Long term (current) use of opiate analgesic: Secondary | ICD-10-CM

## 2024-06-22 ENCOUNTER — Ambulatory Visit: Payer: MEDICAID | Admitting: *Deleted

## 2024-06-22 ENCOUNTER — Other Ambulatory Visit (HOSPITAL_COMMUNITY): Payer: Self-pay

## 2024-06-22 ENCOUNTER — Other Ambulatory Visit: Payer: Self-pay

## 2024-06-22 VITALS — BP 125/78 | HR 76 | Ht 66.0 in | Wt 193.3 lb

## 2024-06-22 DIAGNOSIS — R519 Headache, unspecified: Secondary | ICD-10-CM

## 2024-06-22 DIAGNOSIS — Z0130 Encounter for examination of blood pressure without abnormal findings: Secondary | ICD-10-CM

## 2024-06-22 MED ORDER — CYCLOBENZAPRINE HCL 10 MG PO TABS
10.0000 mg | ORAL_TABLET | Freq: Three times a day (TID) | ORAL | 0 refills | Status: DC | PRN
  Filled 2024-06-22: qty 30, 10d supply, fill #0

## 2024-06-22 NOTE — Progress Notes (Signed)
 Here for BP check after newly elevated postpartum BP. Started norvasc  and states is taking daily. States having headaches=7 most days, takes ibuprofen  which helps some days. Today has HA=7 , took ibuprofen  without relief. States headaches same as when saw provide last. No edema noted. Reviewed with Dr. Lola who advises may have Flexeril  if desired. Patient requested Flexeril  and RX sent. She also c/o still bleeding and wanting IUD strings cut. I explained bleeding issues can vary and can persist 6 weeks after delivery and several months after iUD insertion so give her body more time to adjust and it will likely lessen I explained she can get strings cut at Postpartum visit with provider next week. She voices understanding. Rock Skip PEAK

## 2024-06-25 ENCOUNTER — Other Ambulatory Visit (HOSPITAL_COMMUNITY): Payer: Self-pay

## 2024-06-25 MED ORDER — BUPRENORPHINE HCL-NALOXONE HCL 8-2 MG SL FILM
1.0000 | ORAL_FILM | Freq: Four times a day (QID) | SUBLINGUAL | 0 refills | Status: DC
  Filled 2024-06-25 – 2024-06-28 (×2): qty 120, 30d supply, fill #0

## 2024-06-28 ENCOUNTER — Other Ambulatory Visit: Payer: Self-pay

## 2024-06-28 ENCOUNTER — Other Ambulatory Visit (HOSPITAL_COMMUNITY): Payer: Self-pay

## 2024-06-29 ENCOUNTER — Ambulatory Visit: Payer: MEDICAID | Admitting: Family Medicine

## 2024-06-29 ENCOUNTER — Encounter: Payer: Self-pay | Admitting: Family Medicine

## 2024-06-29 ENCOUNTER — Other Ambulatory Visit (HOSPITAL_COMMUNITY): Payer: Self-pay

## 2024-06-29 ENCOUNTER — Other Ambulatory Visit: Payer: Self-pay

## 2024-06-29 DIAGNOSIS — F321 Major depressive disorder, single episode, moderate: Secondary | ICD-10-CM

## 2024-06-29 DIAGNOSIS — Z975 Presence of (intrauterine) contraceptive device: Secondary | ICD-10-CM

## 2024-06-29 DIAGNOSIS — A6000 Herpesviral infection of urogenital system, unspecified: Secondary | ICD-10-CM

## 2024-06-29 DIAGNOSIS — Z6791 Unspecified blood type, Rh negative: Secondary | ICD-10-CM

## 2024-06-29 DIAGNOSIS — F1190 Opioid use, unspecified, uncomplicated: Secondary | ICD-10-CM | POA: Diagnosis not present

## 2024-06-29 DIAGNOSIS — Z8759 Personal history of other complications of pregnancy, childbirth and the puerperium: Secondary | ICD-10-CM

## 2024-06-29 DIAGNOSIS — D509 Iron deficiency anemia, unspecified: Secondary | ICD-10-CM | POA: Diagnosis not present

## 2024-06-29 DIAGNOSIS — G44229 Chronic tension-type headache, not intractable: Secondary | ICD-10-CM

## 2024-06-29 DIAGNOSIS — F419 Anxiety disorder, unspecified: Secondary | ICD-10-CM

## 2024-06-29 MED ORDER — BUPROPION HCL ER (XL) 150 MG PO TB24
150.0000 mg | ORAL_TABLET | Freq: Every day | ORAL | 5 refills | Status: DC
  Filled 2024-06-29: qty 30, 30d supply, fill #0

## 2024-06-29 NOTE — Progress Notes (Signed)
 Post Partum Visit Note  Tracey Ho is a 28 y.o. [phone] female who presents for a postpartum visit. She is 6 weeks postpartum following a normal spontaneous vaginal delivery.  I have fully reviewed the prenatal and intrapartum course. The delivery was at 38 gestational weeks.  Anesthesia: none. Postpartum course has been notable for elevated BP, started on amlodipine  10 mg on 06/01/2024, still taking the pills. Baby is with her sister, this was planned prenatally. Baby is feeding by bottle - Similac Neosure. Bleeding moderate lochia. Bowel function is normal. Bladder function is normal. Patient is sexually active. Contraception method is IUD. Postpartum depression screening: positive.   The pregnancy intention screening data noted above was reviewed. Potential methods of contraception were discussed. The patient elected to proceed with No data recorded.   Edinburgh Postnatal Depression Scale - 06/29/24 1355       Edinburgh Postnatal Depression Scale:  In the Past 7 Days   I have been able to laugh and see the funny side of things. 0    I have blamed myself unnecessarily when things went wrong. 3    I have been anxious or worried for no good reason. 0    I have felt scared or panicky for no good reason. 3    Things have been getting on top of me. 0    I have been so unhappy that I have had difficulty sleeping. 3    I have felt sad or miserable. 3    I have been so unhappy that I have been crying. 3    The thought of harming myself has occurred to me. 0          Health Maintenance Due  Topic Date Due   Hepatitis B Vaccines 19-59 Average Risk (3 of 3 - 3-dose series) 11/26/1999   HPV VACCINES (1 - 3-dose series) Never done   Pneumococcal Vaccine (1 of 2 - PCV) 06/12/2017    The following portions of the patient's history were reviewed and updated as appropriate: allergies, current medications, past family history, past medical history, past social history, past surgical history,  and problem list.  Review of Systems Pertinent items noted in HPI and remainder of comprehensive ROS otherwise negative.  Objective:  BP 124/80   Pulse (!) 105   LMP 08/19/2023 (Within Days)   Breastfeeding No    General:  alert, cooperative, and appears stated age   Breasts:  not indicated  Lungs: Comfortalbe on room air  Wound N/a  GU exam:  normal. IUD strings trimmed to 2-3 cm         Assessment:   Postpartum exam  Rh negative state in antepartum period  Recurrent genital herpes  Opioid use disorder  Iron  deficiency anemia, unspecified iron  deficiency anemia type  MDD (major depressive disorder), single episode, moderate (HCC)  History of gestational hypertension  IUD (intrauterine device) in place   Abnormal postpartum exam.   Plan:   Essential components of care per ACOG recommendations:  1.  Mood and well being: Patient with positive depression screening today. Reviewed local resources for support.  - Patient tobacco use? No.   - hx of drug use? See below   - now open to psychiatry referral, referral placed. Also discussed starting Wellbutrin, she is amenable.   2. Infant care and feeding:  -Patient currently breastmilk feeding? No.  -Social determinants of health (SDOH) reviewed in EPIC. No concerns  3. Sexuality, contraception and birth spacing - Patient does  not want a pregnancy in the next year.  Desired family size is 4 children.  - Reviewed reproductive life planning. Reviewed contraceptive methods based on pt preferences and effectiveness.  Patient had post placenta IUD placed, strings trimmed today.   - Discussed birth spacing of 18 months  4. Sleep and fatigue -Encouraged family/partner/community support of 4 hrs of uninterrupted sleep to help with mood and fatigue  5. Physical Recovery  - Discussed patients delivery and complications. She describes her labor as good. - Patient had a Vaginal, no problems at delivery. Patient had no  laceration. Perineal healing reviewed. Patient expressed understanding - Patient has urinary incontinence? No. - Patient is safe to resume physical and sexual activity  6.  Health Maintenance - HM due items addressed No - up to date - Last pap smear  Diagnosis  Date Value Ref Range Status  03/27/2022   Final   - Negative for intraepithelial lesion or malignancy (NILM)   Pap smear not done at today's visit.  -Breast Cancer screening indicated? No.   7. Chronic Disease/Pregnancy Condition follow up:   1. Postpartum exam See above  2. Rh negative state in antepartum period Patient reports she declined rhogam Review of baby's chart they do have O+ blood Too late for rhogam now but did counsel on implications for future pregnancies  3. Recurrent genital herpes No lesions on exam  4. Opioid use disorder I have messaged back and forth with her over the past few days. She reports intense craving over the past few weeks despite being on suboxone  8 mg QID Sometimes gets some hot/cold and nausea but denies having intense withdrawal symptoms Has been taking some extra and so is running out early Discussed I suspect this is more related to anxiety and not to inadequate receptor blockade and that we should address the mental side of things, she is amenable to trial of wellbutrin 150 XL Has TSP mandated by CPS coming up Worried because she was told she had tested for oxycodone  two weeks prior to delivery. On review of the chart she had tested positive on a SCREEN at [facility], however [facility] two days later had no oxycodone  or metabolites Asked her to please find out timing of meeting so we can hopefully participate and provide clinical context  5. Iron  deficiency anemia, unspecified iron  deficiency anemia type Lab Results  Component Value Date   HGB 12.9 06/01/2024  Resolved  6. MDD (major depressive disorder), single episode, moderate (HCC) Accepts referral to psychiatry  7. History  of gestational hypertension Started on Amlodipine  10 mg recently, well controlled BP today Discussed returning in two weeks, patient to stop meds three days prior to next visit  8. IUD (intrauterine device) in place Strings trimmed to 2-3 cm  9. Headache Reports ongoing headaches not responding to ibuprofen , tylenol , flexeril  Discussed we have reached my limits, recommend referral to neurology, she is amenable   [name], MD [facility], [facility]

## 2024-07-05 LAB — TOXASSURE FLEX 15, UR
6-ACETYLMORPHINE IA: NEGATIVE ng/mL
7-aminoclonazepam: NOT DETECTED ng/mg{creat}
AMPHETAMINES IA: NEGATIVE ng/mL
Alpha-hydroxyalprazolam: NOT DETECTED ng/mg{creat}
Alpha-hydroxymidazolam: NOT DETECTED ng/mg{creat}
Alpha-hydroxytriazolam: NOT DETECTED ng/mg{creat}
Alprazolam: NOT DETECTED ng/mg{creat}
BARBITURATES IA: NEGATIVE ng/mL
BUPRENORPHINE: POSITIVE
Benzodiazepines: NEGATIVE
Buprenorphine: 753 ng/mg{creat}
COCAINE METABOLITE IA: NEGATIVE ng/mL
Clonazepam: NOT DETECTED ng/mg{creat}
Creatinine: 110 mg/dL (ref >=20)
Desalkylflurazepam: NOT DETECTED ng/mg{creat}
Desmethyldiazepam: NOT DETECTED ng/mg{creat}
Desmethylflunitrazepam: NOT DETECTED ng/mg{creat}
Diazepam: NOT DETECTED ng/mg{creat}
ETHYL ALCOHOL Enzymatic: NEGATIVE g/dL
FENTANYL: NEGATIVE
Fentanyl: NOT DETECTED ng/mg{creat}
Flunitrazepam: NOT DETECTED ng/mg{creat}
Lorazepam: NOT DETECTED ng/mg{creat}
METHADONE IA: NEGATIVE ng/mL
METHADONE MTB IA: NEGATIVE ng/mL
Midazolam: NOT DETECTED ng/mg{creat}
Norbuprenorphine: >909 ng/mg{creat}
Norfentanyl: NOT DETECTED ng/mg{creat}
OPIATE CLASS IA: NEGATIVE ng/mL
OXYCODONE CLASS IA: NEGATIVE ng/mL
Oxazepam: NOT DETECTED ng/mg{creat}
PHENCYCLIDINE IA: NEGATIVE ng/mL
TAPENTADOL, IA: NEGATIVE ng/mL
TRAMADOL IA: NEGATIVE ng/mL
Temazepam: NOT DETECTED ng/mg{creat}

## 2024-07-05 LAB — CANNABINOIDS, MS, UR RFX
Cannabinoids Confirmation: POSITIVE — AB
Carboxy-THC: 20 ng/mg{creat}

## 2024-07-08 ENCOUNTER — Encounter: Payer: Self-pay | Admitting: Family Medicine

## 2024-07-13 ENCOUNTER — Ambulatory Visit: Payer: MEDICAID | Admitting: Family Medicine

## 2024-07-21 ENCOUNTER — Encounter: Payer: Self-pay | Admitting: Family Medicine

## 2024-07-21 ENCOUNTER — Other Ambulatory Visit (HOSPITAL_COMMUNITY): Payer: Self-pay

## 2024-07-21 DIAGNOSIS — Z5181 Encounter for therapeutic drug level monitoring: Secondary | ICD-10-CM

## 2024-07-21 MED ORDER — BUPRENORPHINE HCL-NALOXONE HCL 8-2 MG SL FILM
1.0000 | ORAL_FILM | Freq: Four times a day (QID) | SUBLINGUAL | 0 refills | Status: DC
  Filled 2024-07-21 – 2024-07-26 (×2): qty 120, 30d supply, fill #0

## 2024-07-23 ENCOUNTER — Other Ambulatory Visit (HOSPITAL_COMMUNITY): Payer: Self-pay

## 2024-07-26 ENCOUNTER — Other Ambulatory Visit (HOSPITAL_COMMUNITY): Payer: Self-pay

## 2024-07-26 ENCOUNTER — Encounter: Payer: Self-pay | Admitting: Neurology

## 2024-08-03 ENCOUNTER — Encounter: Payer: Self-pay | Admitting: Family Medicine

## 2024-08-03 ENCOUNTER — Other Ambulatory Visit (HOSPITAL_COMMUNITY): Payer: Self-pay

## 2024-08-03 ENCOUNTER — Other Ambulatory Visit: Payer: Self-pay

## 2024-08-03 ENCOUNTER — Ambulatory Visit: Payer: MEDICAID | Admitting: Family Medicine

## 2024-08-03 VITALS — BP 150/100 | HR 84 | Wt 215.4 lb

## 2024-08-03 DIAGNOSIS — I10 Essential (primary) hypertension: Secondary | ICD-10-CM | POA: Diagnosis not present

## 2024-08-03 DIAGNOSIS — Z8759 Personal history of other complications of pregnancy, childbirth and the puerperium: Secondary | ICD-10-CM

## 2024-08-03 DIAGNOSIS — F321 Major depressive disorder, single episode, moderate: Secondary | ICD-10-CM | POA: Diagnosis not present

## 2024-08-03 DIAGNOSIS — F1190 Opioid use, unspecified, uncomplicated: Secondary | ICD-10-CM | POA: Diagnosis not present

## 2024-08-03 DIAGNOSIS — N761 Subacute and chronic vaginitis: Secondary | ICD-10-CM

## 2024-08-03 DIAGNOSIS — O165 Unspecified maternal hypertension, complicating the puerperium: Secondary | ICD-10-CM

## 2024-08-03 DIAGNOSIS — N921 Excessive and frequent menstruation with irregular cycle: Secondary | ICD-10-CM | POA: Diagnosis not present

## 2024-08-03 DIAGNOSIS — Z975 Presence of (intrauterine) contraceptive device: Secondary | ICD-10-CM

## 2024-08-03 DIAGNOSIS — R519 Headache, unspecified: Secondary | ICD-10-CM

## 2024-08-03 MED ORDER — AMLODIPINE BESYLATE 10 MG PO TABS
10.0000 mg | ORAL_TABLET | Freq: Every day | ORAL | 3 refills | Status: AC
  Filled 2024-08-03: qty 90, 90d supply, fill #0
  Filled 2024-09-29: qty 90, 90d supply, fill #1

## 2024-08-03 MED ORDER — CYCLOBENZAPRINE HCL 10 MG PO TABS
10.0000 mg | ORAL_TABLET | Freq: Three times a day (TID) | ORAL | 5 refills | Status: DC | PRN
  Filled 2024-08-03: qty 30, 10d supply, fill #0
  Filled 2024-08-13: qty 30, 10d supply, fill #1

## 2024-08-03 MED ORDER — AMITRIPTYLINE HCL 25 MG PO TABS
25.0000 mg | ORAL_TABLET | Freq: Every day | ORAL | 2 refills | Status: DC
  Filled 2024-08-03: qty 30, 30d supply, fill #0

## 2024-08-03 MED ORDER — NORGESTIMATE-ETH ESTRADIOL 0.25-35 MG-MCG PO TABS
1.0000 | ORAL_TABLET | Freq: Every day | ORAL | 0 refills | Status: DC
  Filled 2024-08-03: qty 28, 28d supply, fill #0

## 2024-08-03 NOTE — Progress Notes (Signed)
 Tracey Ho is a 28 y.o. [phone] here today for follow up.  Last seen 06/29/2024 for PP visit At that time started on wellbutrin 150 XL due to depression symptoms, stable on suboxone  8 QID Also referred to psychiatry Also instructed to stop amlodipine  3 days prior to todays visit  Reports she has stopped her amlodipine  Has a strong history of HTN in her family  Having ongoing headaches which flexeril , ibuprofen , tylenol  are mostly ineffective for Does get some mild relief from flexeril  and would like to continue Feels them primarily in the crown of her head, occipital, and less often in the forehead Last all day if she doesn't take anything, if she does take something they last a few hours  Has been using boric acid due to concerns about BV/yeast Wondering about getting metrogel /diflucan   Reports Wellbutrin was not helping She took it for about 4 weeks and then stopped.  Has not yet gotten scheduled with psychiatry  Having breakthrough bleeding with her IUD Open to trial of OCP's which we have discussed in the past  Admits to relapse Has again been taking more suboxone  daily than she is prescribed When she ran out she took oxy to stave off symptoms Thinking about sublocade  for her oral health, worried about her teeth  Health Maintenance Due  Topic Date Due   Hepatitis B Vaccines 19-59 Average Risk (3 of 3 - 3-dose series) 11/26/1999   HPV VACCINES (1 - 3-dose series) Never done   Pneumococcal Vaccine (1 of 2 - PCV) 06/12/2017    Past Medical History:  Diagnosis Date   ADHD (attention deficit hyperactivity disorder)    Anemia    Depression    Migraine headache    Opioid use disorder 03/27/2022   Oral thrush 12/04/2023   PID (acute pelvic inflammatory disease) 10/22/2020   Rh negative state in antepartum period 07/22/2022   Patient did not have this on her problem list until 36 weeks.   Reviewed chart and realized 07/22/22 that she has not had rhogam     SOB  (shortness of breath) 01/02/2024   Unwanted fertility 05/02/2022    Past Surgical History:  Procedure Laterality Date   NERVE SURGERY     NO PAST SURGERIES      The following portions of the patient's history were reviewed and updated as appropriate: allergies, current medications, past family history, past medical history, past social history, past surgical history and problem list.   Health Maintenance:   Last pap:  Result Date Procedure Results Follow-ups  03/27/2022 Cytology - PAP( [facility]) Neisseria Gonorrhea: Negative Chlamydia: Negative Trichomonas: Negative Adequacy: Satisfactory for evaluation; transformation zone component PRESENT. Diagnosis: - Negative for intraepithelial lesion or malignancy (NILM) Comment: Normal Reference Ranger Chlamydia - Negative Comment: Normal Reference Range Neisseria Gonorrhea - Negative Comment: Normal Reference Range Trichomonas - Negative     Last mammogram:  N/a    Hepatitis serologies:  Last LFTs: Lab Results  Component Value Date   ALT 12 06/01/2024   AST 15 06/01/2024   ALKPHOS 104 06/01/2024   BILITOT 1.1 06/01/2024     Review of Systems:  Pertinent items noted in HPI and remainder of comprehensive ROS otherwise negative.  Physical Exam:  BP (!) 150/100 (BP Location: Right Arm, Patient Position: Sitting, Cuff Size: Normal)   Pulse 84   Wt 215 lb 6.4 oz (97.7 kg)   Breastfeeding No   BMI 34.77 kg/m  CONSTITUTIONAL: Well-developed, well-nourished female in no acute distress.  HEENT:  Normocephalic, atraumatic. External right and left ear normal. No scleral icterus.  NECK: Normal range of motion, supple, no masses noted on observation SKIN: No rash noted. Not diaphoretic. No erythema. No pallor. MUSCULOSKELETAL: Normal range of motion. No edema noted. NEUROLOGIC: Alert and oriented to person, place, and time. Normal muscle tone coordination.  PSYCHIATRIC: Normal mood and affect. Normal behavior. Normal judgment and  thought content. RESPIRATORY: Effort normal, no problems with respiration noted  Labs and Imaging I have reviewed the PDMP during this encounter.    Last UDS: Lab Results  Component Value Date   CREATIUR 110 06/29/2024     No results found for this or any previous visit (from the past week). No results found.      Assessment and Plan:   Problem List Items Addressed This Visit       Cardiovascular and Mediastinum   Essential hypertension   At this point she is very far from delivery and has a strong family hx. BP was well controlled at last visit on amlodipine , discussed resuming, she is amenable.        Relevant Medications   amLODipine  (NORVASC ) 10 MG tablet     Genitourinary   Vaginitis   Discussed continuing boric acid as her symptoms are improving. Advised to continue x2 weeks and then reassess.         Other   Breakthrough bleeding associated with intrauterine device (IUD)   Discussed trial of OCP's, she is amenable. Reviewed expectations that bleeding should stop while on pills, stop when the pack runs out, and then bleeding should be more regular/lighter after that.       Relevant Medications   norgestimate-ethinyl estradiol (ORTHO-CYCLEN) 0.25-35 MG-MCG tablet   Headache, chronic daily   Has been referred to neurology and has an appt for February. Discussed we could try amitriptyline to address multiple issues, she is amenable to start, rx sent.       Relevant Medications   amLODipine  (NORVASC ) 10 MG tablet   cyclobenzaprine  (FLEXERIL ) 10 MG tablet   amitriptyline (ELAVIL) 25 MG tablet   History of gestational hypertension   MDD (major depressive disorder), single episode, moderate (HCC)   Discussed trial of amitriptyline to address mood as well as headaches, rx sent.  Per review of referral provider ultimately did agree to see patient but still says denied, message sent to scheduler to see if she can get an appt.       Relevant Medications    amitriptyline (ELAVIL) 25 MG tablet   Opioid use disorder - Primary   Admits to episode of relapse.  Again stressed that I think she has undertreated mental health as the primary etiology for using excess buprenorphine  and strongly recommended we focus on this and that she stick to her prescribed dose for suboxone .  Also discussed that we already have trouble getting 32 mg filled and that I have not had a patient who has required higher so we will not be going up.  UDS just for bup to ensure adherence today. Discussed sublocade  is an option but in my experience most patients have to use films on top of injections so may not provide oral health benefits she is hoping for.       Relevant Orders   Buprenorphine , Urine   Other Visit Diagnoses       Postpartum hypertension       Relevant Medications   amLODipine  (NORVASC ) 10 MG tablet  Return in about 4 weeks (around 08/31/2024) for REACH clinic, OUD f/u.    Total face-to-face time with patient: 30 minutes.  Over 50% of encounter was spent on counseling and coordination of care.  Future Appointments  Date Time Provider Department Center  11/04/2024  9:10 AM Skeet Juliene SAUNDERS, DO [facility] None    [name], MD/MPH Attending Family Medicine Physician, [facility] for [facility], [facility]

## 2024-08-03 NOTE — Assessment & Plan Note (Signed)
 Discussed continuing boric acid as her symptoms are improving. Advised to continue x2 weeks and then reassess.

## 2024-08-03 NOTE — Assessment & Plan Note (Addendum)
 Admits to episode of relapse.  Again stressed that I think she has undertreated mental health as the primary etiology for using excess buprenorphine  and strongly recommended we focus on this and that she stick to her prescribed dose for suboxone .  Also discussed that we already have trouble getting 32 mg filled and that I have not had a patient who has required higher so we will not be going up.  UDS just for bup to ensure adherence today. Discussed sublocade  is an option but in my experience most patients have to use films on top of injections so may not provide oral health benefits she is hoping for.

## 2024-08-03 NOTE — Assessment & Plan Note (Signed)
 Has been referred to neurology and has an appt for February. Discussed we could try amitriptyline to address multiple issues, she is amenable to start, rx sent.

## 2024-08-03 NOTE — Assessment & Plan Note (Signed)
 Discussed trial of OCP's, she is amenable. Reviewed expectations that bleeding should stop while on pills, stop when the pack runs out, and then bleeding should be more regular/lighter after that.

## 2024-08-03 NOTE — Assessment & Plan Note (Signed)
 At this point she is very far from delivery and has a strong family hx. BP was well controlled at last visit on amlodipine , discussed resuming, she is amenable.

## 2024-08-03 NOTE — Assessment & Plan Note (Addendum)
 Discussed trial of amitriptyline to address mood as well as headaches, rx sent.  Per review of referral provider ultimately did agree to see patient but still says denied, message sent to scheduler to see if she can get an appt.

## 2024-08-03 NOTE — Progress Notes (Signed)
   Subjective:   Tracey Ho is a 28 y.o. [phone] here today for ongoing postpartum and substance use management.  She reports feeling well and notes improvement with ongoing CPS case, TSP in place.  Her son, [place] voluntarily remains in the care of her sister and is feedings and growing well.  Health Maintenance Due  Topic Date Due   Hepatitis B Vaccines 19-59 Average Risk (3 of 3 - 3-dose series) 11/26/1999   HPV VACCINES (1 - 3-dose series) Never done   Pneumococcal Vaccine (1 of 2 - PCV) 06/12/2017    Past Medical History:  Diagnosis Date   ADHD (attention deficit hyperactivity disorder)    Anemia    Depression    Migraine headache    Opioid use disorder 03/27/2022   Oral thrush 12/04/2023   PID (acute pelvic inflammatory disease) 10/22/2020   Rh negative state in antepartum period 07/22/2022   Patient did not have this on her problem list until 36 weeks.   Reviewed chart and realized 07/22/22 that she has not had rhogam     SOB (shortness of breath) 01/02/2024   Unwanted fertility 05/02/2022    Past Surgical History:  Procedure Laterality Date   NERVE SURGERY     NO PAST SURGERIES      The following portions of the patient's history were reviewed and updated as appropriate: allergies, current medications, past family history, past medical history, past social history, past surgical history and problem list.     Objective:   [name] is well appearing with clear communication and thought pattern.   Assessment and Plan:  We reviewed appropriate eating and sleeping patterns for [place] at 3 months of life.  She is aware of our ongoing support as needed with her CPS case.  She has REACH consult contact if needs arise prior to next visit. Problem List Items Addressed This Visit       Other   MDD (major depressive disorder), single episode, moderate (HCC)   Opioid use disorder - Primary   Relevant Orders   ToxAssure Flex 15, Ur   History of gestational hypertension     Routine preventative health maintenance measures emphasized. Please refer to After Visit Summary for other counseling recommendations.   No follow-ups on file.    Total face-to-face time with patient: 10 minutes.  Over 50% of encounter was spent on counseling and coordination of care.   Delon LITTIE Frater, NNP-BC Neonatal Nurse Practitioner Substance Exposed Newborn Consult at the [facility] [phone]

## 2024-08-04 LAB — BUPRENORPHINE, URINE: Buprenorphine, Urine: POSITIVE ng/mL — AB

## 2024-08-13 ENCOUNTER — Other Ambulatory Visit (HOSPITAL_COMMUNITY): Payer: Self-pay

## 2024-08-23 ENCOUNTER — Other Ambulatory Visit: Payer: Self-pay | Admitting: Family Medicine

## 2024-08-23 ENCOUNTER — Telehealth: Payer: Self-pay | Admitting: Family Medicine

## 2024-08-23 ENCOUNTER — Encounter: Payer: Self-pay | Admitting: Family Medicine

## 2024-08-23 ENCOUNTER — Other Ambulatory Visit (HOSPITAL_COMMUNITY): Payer: Self-pay

## 2024-08-23 DIAGNOSIS — Z5181 Encounter for therapeutic drug level monitoring: Secondary | ICD-10-CM

## 2024-08-23 MED ORDER — BUPRENORPHINE HCL-NALOXONE HCL 8-2 MG SL FILM
1.0000 | ORAL_FILM | Freq: Four times a day (QID) | SUBLINGUAL | 0 refills | Status: DC
  Filled 2024-08-23: qty 120, 30d supply, fill #0

## 2024-08-23 NOTE — Telephone Encounter (Signed)
 Patient called back and wants to know why the nurse can't just call in the Rx. I told her I would send a message to the nurse.She said she needs her meds.

## 2024-08-23 NOTE — Telephone Encounter (Signed)
 Pt message about Suboxone  Rx forwarded to Dr. Lola.

## 2024-08-23 NOTE — Telephone Encounter (Signed)
 Patient is requesting a refill on her RX.

## 2024-08-31 ENCOUNTER — Ambulatory Visit: Payer: MEDICAID | Admitting: Advanced Practice Midwife

## 2024-08-31 ENCOUNTER — Other Ambulatory Visit: Payer: Self-pay

## 2024-08-31 ENCOUNTER — Encounter: Payer: Self-pay | Admitting: Advanced Practice Midwife

## 2024-08-31 ENCOUNTER — Other Ambulatory Visit (HOSPITAL_COMMUNITY): Payer: Self-pay

## 2024-08-31 VITALS — BP 132/89 | HR 94 | Wt 232.8 lb

## 2024-08-31 DIAGNOSIS — N921 Excessive and frequent menstruation with irregular cycle: Secondary | ICD-10-CM

## 2024-08-31 DIAGNOSIS — F321 Major depressive disorder, single episode, moderate: Secondary | ICD-10-CM

## 2024-08-31 DIAGNOSIS — G43009 Migraine without aura, not intractable, without status migrainosus: Secondary | ICD-10-CM

## 2024-08-31 DIAGNOSIS — I10 Essential (primary) hypertension: Secondary | ICD-10-CM

## 2024-08-31 DIAGNOSIS — F419 Anxiety disorder, unspecified: Secondary | ICD-10-CM | POA: Diagnosis not present

## 2024-08-31 DIAGNOSIS — Z975 Presence of (intrauterine) contraceptive device: Secondary | ICD-10-CM | POA: Diagnosis not present

## 2024-08-31 DIAGNOSIS — R519 Headache, unspecified: Secondary | ICD-10-CM | POA: Diagnosis not present

## 2024-08-31 DIAGNOSIS — Z79891 Long term (current) use of opiate analgesic: Secondary | ICD-10-CM

## 2024-08-31 DIAGNOSIS — Z5181 Encounter for therapeutic drug level monitoring: Secondary | ICD-10-CM

## 2024-08-31 MED ORDER — CYCLOBENZAPRINE HCL 10 MG PO TABS
10.0000 mg | ORAL_TABLET | Freq: Three times a day (TID) | ORAL | 5 refills | Status: AC | PRN
  Filled 2024-08-31: qty 45, 15d supply, fill #0
  Filled 2024-09-09 – 2024-09-13 (×3): qty 45, 15d supply, fill #1
  Filled 2024-10-04: qty 45, 15d supply, fill #2
  Filled 2024-10-18: qty 45, 15d supply, fill #3

## 2024-08-31 MED ORDER — NORGESTIMATE-ETH ESTRADIOL 0.25-35 MG-MCG PO TABS
1.0000 | ORAL_TABLET | Freq: Every day | ORAL | 0 refills | Status: DC
  Filled 2024-08-31: qty 28, 28d supply, fill #0

## 2024-08-31 MED ORDER — HYDROCHLOROTHIAZIDE 25 MG PO TABS
25.0000 mg | ORAL_TABLET | Freq: Every day | ORAL | 3 refills | Status: AC
  Filled 2024-08-31: qty 30, 30d supply, fill #0

## 2024-08-31 MED ORDER — BUPRENORPHINE HCL-NALOXONE HCL 8-2 MG SL FILM
1.0000 | ORAL_FILM | Freq: Four times a day (QID) | SUBLINGUAL | 0 refills | Status: DC
  Filled 2024-08-31 – 2024-09-20 (×2): qty 120, 30d supply, fill #0

## 2024-08-31 MED ORDER — AMITRIPTYLINE HCL 25 MG PO TABS
50.0000 mg | ORAL_TABLET | Freq: Every day | ORAL | 1 refills | Status: DC
  Filled 2024-08-31: qty 60, 30d supply, fill #0

## 2024-08-31 NOTE — Progress Notes (Signed)
 Subjective:   Tracey Ho is a 28 y.o. [phone] here today for ongoing postpartum care, substance use disorder.  [name] reports feeling well following a brief interruption in her ability to get her buprenorphine .  She reports concern for ongoing weight gain.  Health Maintenance Due  Topic Date Due   Hepatitis B Vaccines 19-59 Average Risk (3 of 3 - 3-dose series) 11/26/1999   HPV VACCINES (1 - 3-dose series) Never done    Past Medical History:  Diagnosis Date   ADHD (attention deficit hyperactivity disorder)    Anemia    Depression    Migraine headache    Opioid use disorder 03/27/2022   Oral thrush 12/04/2023   PID (acute pelvic inflammatory disease) 10/22/2020   Rh negative state in antepartum period 07/22/2022   Patient did not have this on her problem list until 36 weeks.   Reviewed chart and realized 07/22/22 that she has not had rhogam     SOB (shortness of breath) 01/02/2024   Unwanted fertility 05/02/2022    Past Surgical History:  Procedure Laterality Date   NERVE SURGERY     NO PAST SURGERIES      The following portions of the patient's history were reviewed and updated as appropriate: allergies, current medications, past family history, past medical history, past social history, past surgical history and problem list.     Objective:   [name] presents with an improved mood despite several challenges over the last week in securing her medication and frustration over ongoing weight gain.  She is clear and and concise with her communication.  Assessment and Plan:  Confirmed that [name] has REACH consult contact information if difficulty arises again with obtaining her buprenorphine .  We reviewed her current diet and small but sustainable changes she could make (reducing sweet tea by 1 per day).  She appears motivated to make these changes and report back to us  at her next visit.  She will contact REACH phone is needs arise prior to next visit. Problem List Items  Addressed This Visit       Cardiovascular and Mediastinum   Migraine without aura and without status migrainosus, not intractable - Primary   Relevant Medications   cyclobenzaprine  (FLEXERIL ) 10 MG tablet   amitriptyline  (ELAVIL ) 25 MG tablet   Buprenorphine  HCl-Naloxone  HCl (SUBOXONE ) 8-2 MG FILM   hydrochlorothiazide  (HYDRODIURIL ) 25 MG tablet   Essential hypertension   Relevant Medications   hydrochlorothiazide  (HYDRODIURIL ) 25 MG tablet   Other Relevant Orders   Comp Met (CMET)   CBC     Other   MDD (major depressive disorder), single episode, moderate (HCC)   Relevant Medications   amitriptyline  (ELAVIL ) 25 MG tablet   Anxiety   Relevant Medications   amitriptyline  (ELAVIL ) 25 MG tablet   IUD (intrauterine device) in place   Headache, chronic daily   Relevant Medications   cyclobenzaprine  (FLEXERIL ) 10 MG tablet   amitriptyline  (ELAVIL ) 25 MG tablet   Buprenorphine  HCl-Naloxone  HCl (SUBOXONE ) 8-2 MG FILM   Other Relevant Orders   CBC   Breakthrough bleeding associated with intrauterine device (IUD)   Relevant Medications   norgestimate -ethinyl estradiol  (ORTHO-CYCLEN) 0.25-35 MG-MCG tablet   Encounter for monitoring Suboxone  maintenance therapy   Relevant Medications   Buprenorphine  HCl-Naloxone  HCl (SUBOXONE ) 8-2 MG FILM   Other Relevant Orders   ToxAssure Flex 15, Ur    Routine preventative health maintenance measures emphasized. Please refer to After Visit Summary for other counseling recommendations.   Return in about  4 weeks (around 09/28/2024) for REACH GYN.    Total face-to-face time with patient: 20 minutes.  Over 50% of encounter was spent on counseling and coordination of care.   Delon LITTIE Frater, NNP-BC Neonatal Nurse Practitioner Substance Exposed Newborn Consult at the [facility] [phone]

## 2024-08-31 NOTE — Progress Notes (Signed)
 Tracey Ho is a 28 y.o. [phone] here today for Buprenorphine  management. She is 3 1/2 months postpartum. Still has cravings on 8 QID and sometimes takes more than prescribed.   Also reports: Breakthrough bleeding with IUD Concerns with weight gain. Wondering if it is from IUD. Gained 17 lb since 08/03/24.  Swelling in both legs . No calf pain or SOB.  Frequent chronic HA's. Referred to Neuro. Has appt scheduled 2/12. HA's improved somewhat with Amitriptyline .  No severe or thunder clap HA's. Anxiety. Slight improvement with amitriptyline . Thinks she might be feeling like she needs more Suboxone  due to anxiety Sx. Thinks going up on amitriptyline  might help. Referred to psych. Appt scheduled.  5. Taking Norvasc  10 mg QD as prescribed. Dr. Lola previously informed her that this is now chronic HTN and needs to be managed by PCP going forward.   Health Maintenance Due  Topic Date Due   Hepatitis B Vaccines 19-59 Average Risk (3 of 3 - 3-dose series) 11/26/1999   HPV VACCINES (1 - 3-dose series) Never done    Past Medical History:  Diagnosis Date   ADHD (attention deficit hyperactivity disorder)    Anemia    Depression    Migraine headache    Opioid use disorder 03/27/2022   Oral thrush 12/04/2023   PID (acute pelvic inflammatory disease) 10/22/2020   Rh negative state in antepartum period 07/22/2022   SOB (shortness of breath) 01/02/2024   Unwanted fertility 05/02/2022    Past Surgical History:  Procedure Laterality Date   NERVE SURGERY     NO PAST SURGERIES      The following portions of the patient's history were reviewed and updated as appropriate: allergies, current medications, past family history, past medical history, past social history, past surgical history and problem list.   Health Maintenance:   Last pap:  Result Date Procedure Results Follow-ups  03/27/2022 Cytology - PAP( [place]) Neisseria Gonorrhea: Negative Chlamydia: Negative Trichomonas:  Negative Adequacy: Satisfactory for evaluation; transformation zone component PRESENT. Diagnosis: - Negative for intraepithelial lesion or malignancy (NILM) Comment: Normal Reference Ranger Chlamydia - Negative Comment: Normal Reference Range Neisseria Gonorrhea - Negative Comment: Normal Reference Range Trichomonas - Negative      Last LFTs: Lab Results  Component Value Date   ALT 12 06/01/2024   AST 15 06/01/2024   ALKPHOS 104 06/01/2024   BILITOT 1.1 06/01/2024     Review of Systems:  Pertinent items noted in HPI and remainder of comprehensive ROS otherwise negative.  Physical Exam:  BP (!) 143/91   Pulse (!) 101   Wt 232 lb 12.8 oz (105.6 kg)   Breastfeeding No   BMI 37.57 kg/m  CONSTITUTIONAL: Well-developed, well-nourished female in no acute distress.  HEENT:  Normocephalic, atraumatic. External right and left ear normal. No scleral icterus.  SKIN: No rash noted. Not diaphoretic. No erythema. No pallor. MUSCULOSKELETAL: Normal range of motion. Pedal edema noted. NEUROLOGIC: Alert and oriented to person, place, and time. Normal muscle tone coordination.  PSYCHIATRIC: Normal mood and affect. Normal behavior. Normal judgment and thought content. RESPIRATORY: Effort normal, no problems with respiration noted ABDOMEN: Deferred PELVIC: Deferred   Labs and Imaging I have reviewed the PDMP during this encounter.   Last UDS: Lab Results  Component Value Date   CREATIUR 110 06/29/2024      Assessment and Plan:  1. Breakthrough bleeding associated with intrauterine device (IUD) - norgestimate -ethinyl estradiol  (ORTHO-CYCLEN) 0.25-35 MG-MCG tablet; Take 1 tablet by mouth daily.  Dispense: 28 tablet; Refill: 0 - Discussed that bleeding irregularities are common in the first 3-6 months after IUD placement and also postpartum and usually resolved spontaneously. Can remove IUD at pt request. She is OK with giving it a little longer.   2. Headache, chronic daily -  cyclobenzaprine  (FLEXERIL ) 10 MG tablet; Take 1 tablet (10 mg total) by mouth every 8 (eight) hours as needed for muscle spasms.  Dispense: 45 tablet; Refill: 5 - amitriptyline  (ELAVIL ) 25 MG tablet; Take 2 tablets (50 mg total) by mouth at bedtime.  Dispense: 60 tablet; Refill: 1 - CBC - F/u w/ neuro  3. MDD (major depressive disorder), single episode, moderate (HCC) - amitriptyline  (ELAVIL ) 25 MG tablet; Take 2 tablets (50 mg total) by mouth at bedtime.  Dispense: 60 tablet; Refill: 1  4. Encounter for monitoring Suboxone  maintenance therapy - Discussed that there is no increase in action of Suboxone  over 32 mg per day and strongly discouraged her from taking more. We will not be able to fill Rx early. Discussed importance of addressing depression and anxiety and F/U with psych.  - Last ToxAssure results C/W prescription.  - ToxAssure Flex 15, Ur - Buprenorphine  HCl-Naloxone  HCl (SUBOXONE ) 8-2 MG FILM; Place 1 Film under the tongue in the morning, at noon, in the evening, and at bedtime.  Dispense: 120 Film; Refill: 0  5. Migraine without aura and without status migrainosus, not intractable (Primary) - amitriptyline  (ELAVIL ) 25 MG tablet; Take 2 tablets (50 mg total) by mouth at bedtime.  Dispense: 60 tablet; Refill: 1  6. Essential hypertension--uncontrolled on norvasc  10 - Comp Met (CMET) - CBC - Add HCTZ 25 mg QAM for better BP control and to help with edema. Discussed need monitor electrolytes. - Establish PCP ASAP. They can adjust BP meds.  7. IUD (intrauterine device) in place  8. Anxiety - Increased amitriptyline  (ELAVIL ) 25 MG tablet; Take 2 tablets (50 mg total) by mouth at bedtime.  Dispense: 60 tablet; Refill: 1   Return in about 4 weeks (around 09/28/2024) for REACH GYN.   Future Appointments  Date Time Provider Department Center  09/15/2024 10:00 AM Rankin, Luisa NOVAK, NP [facility] None  09/28/2024  2:35 PM Lola Donnice HERO, MD [facility]  11/04/2024  9:10 AM Skeet Juliene SAUNDERS,  DO [facility] None    Total face-to-face time with patient: 40 minutes.  Over 50% of encounter was spent on counseling and coordination of care.   Gottfried Standish  Claudene HOWARD 08/31/2024 2:34 PM [facility] [place], [facility]

## 2024-09-01 LAB — COMPREHENSIVE METABOLIC PANEL WITH GFR
ALT: 10 IU/L (ref 0–32)
AST: 18 IU/L (ref 0–40)
Albumin: 4.3 g/dL (ref 4.0–5.0)
Alkaline Phosphatase: 81 IU/L (ref 41–116)
BUN/Creatinine Ratio: 13 (ref 9–23)
BUN: 9 mg/dL (ref 6–20)
Bilirubin Total: 0.7 mg/dL (ref 0.0–1.2)
CO2: 22 mmol/L (ref 20–29)
Calcium: 9.1 mg/dL (ref 8.7–10.2)
Chloride: 102 mmol/L (ref 96–106)
Creatinine, Ser: 0.72 mg/dL (ref 0.57–1.00)
Globulin, Total: 3.1 g/dL (ref 1.5–4.5)
Glucose: 103 mg/dL — ABNORMAL HIGH (ref 70–99)
Potassium: 4.2 mmol/L (ref 3.5–5.2)
Sodium: 136 mmol/L (ref 134–144)
Total Protein: 7.4 g/dL (ref 6.0–8.5)
eGFR: 118 mL/min/1.73 (ref >59)

## 2024-09-01 LAB — CBC
Hematocrit: 36.7 % (ref 34.0–46.6)
Hemoglobin: 12.2 g/dL (ref 11.1–15.9)
MCH: 28.8 pg (ref 26.6–33.0)
MCHC: 33.2 g/dL (ref 31.5–35.7)
MCV: 87 fL (ref 79–97)
Platelets: 256 x10E3/uL (ref 150–450)
RBC: 4.23 x10E6/uL (ref 3.77–5.28)
RDW: 11.8 % (ref 11.7–15.4)
WBC: 3.5 x10E3/uL (ref 3.4–10.8)

## 2024-09-03 LAB — TOXASSURE FLEX 15, UR
6-ACETYLMORPHINE IA: NEGATIVE ng/mL
7-aminoclonazepam: NOT DETECTED ng/mg{creat}
AMPHETAMINES IA: NEGATIVE ng/mL
Alpha-hydroxyalprazolam: NOT DETECTED ng/mg{creat}
Alpha-hydroxymidazolam: NOT DETECTED ng/mg{creat}
Alpha-hydroxytriazolam: NOT DETECTED ng/mg{creat}
Alprazolam: NOT DETECTED ng/mg{creat}
BARBITURATES IA: NEGATIVE ng/mL
BUPRENORPHINE: POSITIVE
Benzodiazepines: NEGATIVE
Buprenorphine: 167 ng/mg{creat}
CANNABINOIDS IA: NEGATIVE ng/mL
COCAINE METABOLITE IA: NEGATIVE ng/mL
Clonazepam: NOT DETECTED ng/mg{creat}
Creatinine: 168 mg/dL (ref >=20)
Desalkylflurazepam: NOT DETECTED ng/mg{creat}
Desmethyldiazepam: NOT DETECTED ng/mg{creat}
Desmethylflunitrazepam: NOT DETECTED ng/mg{creat}
Diazepam: NOT DETECTED ng/mg{creat}
ETHYL ALCOHOL Enzymatic: NEGATIVE g/dL
FENTANYL: NEGATIVE
Fentanyl: NOT DETECTED ng/mg{creat}
Flunitrazepam: NOT DETECTED ng/mg{creat}
Lorazepam: NOT DETECTED ng/mg{creat}
METHADONE IA: NEGATIVE ng/mL
METHADONE MTB IA: NEGATIVE ng/mL
Midazolam: NOT DETECTED ng/mg{creat}
Norbuprenorphine: 485 ng/mg{creat}
Norfentanyl: NOT DETECTED ng/mg{creat}
OPIATE CLASS IA: NEGATIVE ng/mL
OXYCODONE CLASS IA: NEGATIVE ng/mL
Oxazepam: NOT DETECTED ng/mg{creat}
PHENCYCLIDINE IA: NEGATIVE ng/mL
TAPENTADOL, IA: NEGATIVE ng/mL
TRAMADOL IA: NEGATIVE ng/mL
Temazepam: NOT DETECTED ng/mg{creat}

## 2024-09-07 ENCOUNTER — Encounter: Payer: Self-pay | Admitting: Advanced Practice Midwife

## 2024-09-07 ENCOUNTER — Ambulatory Visit: Payer: Self-pay | Admitting: Advanced Practice Midwife

## 2024-09-09 ENCOUNTER — Other Ambulatory Visit (HOSPITAL_COMMUNITY): Payer: Self-pay

## 2024-09-10 ENCOUNTER — Other Ambulatory Visit (HOSPITAL_COMMUNITY): Payer: Self-pay

## 2024-09-13 ENCOUNTER — Other Ambulatory Visit (HOSPITAL_COMMUNITY): Payer: Self-pay

## 2024-09-15 ENCOUNTER — Encounter (HOSPITAL_COMMUNITY): Payer: Self-pay

## 2024-09-15 ENCOUNTER — Ambulatory Visit (HOSPITAL_COMMUNITY): Payer: MEDICAID | Admitting: Registered Nurse

## 2024-09-20 ENCOUNTER — Other Ambulatory Visit (HOSPITAL_COMMUNITY): Payer: Self-pay

## 2024-09-28 ENCOUNTER — Other Ambulatory Visit (HOSPITAL_COMMUNITY): Payer: Self-pay

## 2024-09-28 ENCOUNTER — Ambulatory Visit: Payer: MEDICAID | Admitting: Family Medicine

## 2024-09-28 ENCOUNTER — Other Ambulatory Visit: Payer: Self-pay

## 2024-09-28 ENCOUNTER — Other Ambulatory Visit (HOSPITAL_COMMUNITY)
Admission: RE | Admit: 2024-09-28 | Discharge: 2024-09-28 | Disposition: A | Discharge status: Home / Self Care | Payer: MEDICAID | Source: Ambulatory Visit | Attending: Family Medicine | Admitting: Family Medicine

## 2024-09-28 VITALS — BP 135/93 | HR 110 | Wt 236.3 lb

## 2024-09-28 DIAGNOSIS — Z975 Presence of (intrauterine) contraceptive device: Secondary | ICD-10-CM | POA: Insufficient documentation

## 2024-09-28 DIAGNOSIS — Z30432 Encounter for removal of intrauterine contraceptive device: Secondary | ICD-10-CM | POA: Diagnosis not present

## 2024-09-28 DIAGNOSIS — I10 Essential (primary) hypertension: Secondary | ICD-10-CM

## 2024-09-28 DIAGNOSIS — R519 Headache, unspecified: Secondary | ICD-10-CM

## 2024-09-28 DIAGNOSIS — F1190 Opioid use, unspecified, uncomplicated: Secondary | ICD-10-CM | POA: Diagnosis not present

## 2024-09-28 DIAGNOSIS — N921 Excessive and frequent menstruation with irregular cycle: Secondary | ICD-10-CM

## 2024-09-28 MED ORDER — NORGESTIMATE-ETH ESTRADIOL 0.25-35 MG-MCG PO TABS
1.0000 | ORAL_TABLET | Freq: Every day | ORAL | 3 refills | Status: AC
  Filled 2024-09-28: qty 84, 63d supply, fill #0

## 2024-09-28 NOTE — Progress Notes (Unsigned)
 "   Tracey Ho is a 29 y.o. [phone] here today for multiple issues.   She would like her IUD removed, not happy with ongoing spotting she has had since getting it and OCP's have not improved that by much Would like to just use the pills after that  Reports she had a slip up over new years and took some opioid pills  Denies any other use Reports she is consistent with her buprenorphine   Would like a self swab to check for infections  Headaches are a bit better since starting amitriptyline  Has neurologist appt coming up soon     Health Maintenance Due  Topic Date Due   Hepatitis B Vaccines 19-59 Average Risk (3 of 3 - 3-dose series) 11/26/1999   HPV VACCINES (1 - 3-dose series) Never done    Past Medical History:  Diagnosis Date   ADHD (attention deficit hyperactivity disorder)    Anemia    Anxiety    Depression    Migraine headache    Opioid use disorder 03/27/2022   Oral thrush 12/04/2023   PID (acute pelvic inflammatory disease) 10/22/2020   Rh negative state in antepartum period 07/22/2022   SOB (shortness of breath) 01/02/2024   Unwanted fertility 05/02/2022    Past Surgical History:  Procedure Laterality Date   NERVE SURGERY     NO PAST SURGERIES      The following portions of the patient's history were reviewed and updated as appropriate: allergies, current medications, past family history, past medical history, past social history, past surgical history and problem list.   Health Maintenance:   Last pap:  Result Date Procedure Results Follow-ups  03/27/2022 Cytology - PAP( [place]) Neisseria Gonorrhea: Negative Chlamydia: Negative Trichomonas: Negative Adequacy: Satisfactory for evaluation; transformation zone component PRESENT. Diagnosis: - Negative for intraepithelial lesion or malignancy (NILM) Comment: Normal Reference Ranger Chlamydia - Negative Comment: Normal Reference Range Neisseria Gonorrhea - Negative Comment: Normal Reference Range  Trichomonas - Negative      Last mammogram:  N/a    Last LFTs: Lab Results  Component Value Date   ALT 10 08/31/2024   AST 18 08/31/2024   ALKPHOS 81 08/31/2024   BILITOT 0.7 08/31/2024     Review of Systems:  Pertinent items noted in HPI and remainder of comprehensive ROS otherwise negative.  Physical Exam:  BP (!) 135/93   Pulse (!) 110   Wt 236 lb 5 oz (107.2 kg)   LMP 09/14/2024 (Approximate)   Breastfeeding No   BMI 38.14 kg/m  CONSTITUTIONAL: Well-developed, well-nourished female in no acute distress.  HEENT:  Normocephalic, atraumatic. External right and left ear normal. No scleral icterus.  NECK: Normal range of motion, supple, no masses noted on observation SKIN: No rash noted. Not diaphoretic. No erythema. No pallor. MUSCULOSKELETAL: Normal range of motion. No edema noted. NEUROLOGIC: Alert and oriented to person, place, and time. Normal muscle tone coordination.  PSYCHIATRIC: Normal mood and affect. Normal behavior. Normal judgment and thought content. RESPIRATORY: Effort normal, no problems with respiration noted PELVIC: Normal appearing external genitalia; normal appearing vaginal mucosa and cervix.  No abnormal discharge noted. IUD strings visualized at os, removed with ease.   Labs and Imaging PDMP not reviewed this encounter.    Last UDS: Lab Results  Component Value Date   CREATIUR 168 08/31/2024     Results for orders placed or performed in visit on 09/28/24 (from the past week)  Cervicovaginal ancillary only( )   Collection Time: 09/28/24  3:43 PM  Result Value Ref Range   Neisseria Gonorrhea Negative    Chlamydia Negative    Trichomonas Negative    Bacterial Vaginitis (gardnerella) Negative    Candida Vaginitis Positive (A)    Candida Glabrata Negative    Comment Normal Reference Ranger Chlamydia - Negative    Comment      Normal Reference Range Neisseria Gonorrhea - Negative   Comment      Normal Reference Range  Bacterial Vaginosis - Negative   Comment Normal Reference Range Candida Species - Negative    Comment Normal Reference Range Candida Galbrata - Negative    Comment Normal Reference Range Trichomonas - Negative    No results found.      Assessment and Plan:   Problem List Items Addressed This Visit       Cardiovascular and Mediastinum   Essential hypertension   Improving with dual therapy of hydrochlorothiazide  and amlodipine . If not improved at next visit may need to consider switching off OCP's given relative contraindication.         Other   Breakthrough bleeding associated with intrauterine device (IUD)   Patient not satisfied with this method, removed without complication, see procedure note. Will use OCP's going forward.       Relevant Medications   norgestimate -ethinyl estradiol  (ORTHO-CYCLEN) 0.25-35 MG-MCG tablet   Other Relevant Orders   Cervicovaginal ancillary only( [place]) (Completed)   Headache, chronic daily   Continue current therapy, f/u with Neurology soon.       Opioid use disorder - Primary   Mostly stable though does acknolwedge slip up, UDS sent today, not yet due for refill.       Relevant Orders   ToxAssure Flex 15, Ur   Other Visit Diagnoses       Encounter for IUD removal [Z30.432]             Return in about 2 months (around 11/26/2024) for REACH clinic.    Total face-to-face time with patient: 30 minutes.  Over 50% of encounter was spent on counseling and coordination of care.  Future Appointments  Date Time Provider Department Center  11/04/2024  9:10 AM Skeet Juliene SAUNDERS, DO [facility] None    [name], MD/MPH Attending Family Medicine Physician, [facility] for [facility], [facility] "

## 2024-09-29 ENCOUNTER — Encounter (HOSPITAL_COMMUNITY): Payer: Self-pay | Admitting: Registered Nurse

## 2024-09-29 ENCOUNTER — Encounter (HOSPITAL_COMMUNITY): Payer: Self-pay

## 2024-09-29 ENCOUNTER — Other Ambulatory Visit (HOSPITAL_COMMUNITY): Payer: Self-pay

## 2024-09-29 ENCOUNTER — Encounter: Payer: Self-pay | Admitting: Family Medicine

## 2024-09-29 ENCOUNTER — Ambulatory Visit: Payer: Self-pay | Admitting: Family Medicine

## 2024-09-29 ENCOUNTER — Ambulatory Visit (INDEPENDENT_AMBULATORY_CARE_PROVIDER_SITE_OTHER): Payer: MEDICAID | Admitting: Registered Nurse

## 2024-09-29 ENCOUNTER — Telehealth (HOSPITAL_COMMUNITY): Payer: Self-pay | Admitting: Registered Nurse

## 2024-09-29 DIAGNOSIS — G4700 Insomnia, unspecified: Secondary | ICD-10-CM | POA: Diagnosis not present

## 2024-09-29 DIAGNOSIS — F411 Generalized anxiety disorder: Secondary | ICD-10-CM

## 2024-09-29 DIAGNOSIS — F331 Major depressive disorder, recurrent, moderate: Secondary | ICD-10-CM

## 2024-09-29 LAB — CERVICOVAGINAL ANCILLARY ONLY
Bacterial Vaginitis (gardnerella): NEGATIVE
Candida Glabrata: NEGATIVE
Candida Vaginitis: POSITIVE — AB
Chlamydia: NEGATIVE
Comment: NEGATIVE
Comment: NEGATIVE
Comment: NEGATIVE
Comment: NEGATIVE
Comment: NEGATIVE
Comment: NORMAL
Neisseria Gonorrhea: NEGATIVE
Trichomonas: NEGATIVE

## 2024-09-29 MED ORDER — TRAZODONE HCL 50 MG PO TABS
50.0000 mg | ORAL_TABLET | Freq: Every evening | ORAL | 1 refills | Status: DC | PRN
  Filled 2024-09-29: qty 30, 30d supply, fill #0

## 2024-09-29 MED ORDER — AMITRIPTYLINE HCL 75 MG PO TABS
75.0000 mg | ORAL_TABLET | Freq: Every day | ORAL | 1 refills | Status: AC
  Filled 2024-09-29: qty 30, 30d supply, fill #0

## 2024-09-29 MED ORDER — HYDROXYZINE HCL 10 MG PO TABS
10.0000 mg | ORAL_TABLET | Freq: Three times a day (TID) | ORAL | 1 refills | Status: AC | PRN
  Filled 2024-09-29: qty 60, 20d supply, fill #0
  Filled 2024-10-04: qty 60, 20d supply, fill #1

## 2024-09-29 MED ORDER — FLUCONAZOLE 150 MG PO TABS
150.0000 mg | ORAL_TABLET | Freq: Once | ORAL | 0 refills | Status: AC
  Filled 2024-09-29: qty 1, 1d supply, fill #0

## 2024-09-29 NOTE — Assessment & Plan Note (Signed)
 Improving with dual therapy of hydrochlorothiazide  and amlodipine . If not improved at next visit may need to consider switching off OCP's given relative contraindication.

## 2024-09-29 NOTE — Assessment & Plan Note (Signed)
 Continue current therapy, f/u with Neurology soon.

## 2024-09-29 NOTE — Progress Notes (Signed)
" ° ° °  GYNECOLOGY OFFICE PROCEDURE NOTE  Tracey Ho is a 29 y.o. [id number] here for Mirena  IUD removal.    Last pap smear:     Component Value Date/Time   DIAGPAP  03/27/2022 1442    - Negative for intraepithelial lesion or malignancy (NILM)   ADEQPAP  03/27/2022 1442    Satisfactory for evaluation; transformation zone component PRESENT.     IUD Removal  Patient identified, informed consent performed, consent signed.  Patient was in the dorsal lithotomy position, a speculum was placed in the patient's vagina. The cervix was visualized. The strings of the IUD were grasped and pulled using ring forceps. Patient tolerated the procedure well.    Patient plans to use oral contraceptives (estrogen/progesterone) for contraception.   [name], MD, MPH, FAAFP Attending Family Medicine Physician, [facility] for [facility], [facility]  "

## 2024-09-29 NOTE — Telephone Encounter (Signed)
 Called to schedule follow up appointment from today's visit and to schedule therapy. No answer and message returned that the call could not be completed as dialed. Number on record verified.  Sending MyChart message.

## 2024-09-29 NOTE — Patient Instructions (Signed)

## 2024-09-29 NOTE — Assessment & Plan Note (Signed)
 Mostly stable though does acknolwedge slip up, UDS sent today, not yet due for refill.

## 2024-09-29 NOTE — Assessment & Plan Note (Signed)
 Patient not satisfied with this method, removed without complication, see procedure note. Will use OCP's going forward.
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
## 2024-09-30 LAB — TOXASSURE FLEX 15, UR
6-ACETYLMORPHINE IA: NEGATIVE ng/mL
7-aminoclonazepam: NOT DETECTED ng/mg{creat}
AMPHETAMINES IA: NEGATIVE ng/mL
Alpha-hydroxyalprazolam: NOT DETECTED ng/mg{creat}
Alpha-hydroxymidazolam: NOT DETECTED ng/mg{creat}
Alpha-hydroxytriazolam: NOT DETECTED ng/mg{creat}
Alprazolam: NOT DETECTED ng/mg{creat}
BARBITURATES IA: NEGATIVE ng/mL
BUPRENORPHINE: POSITIVE
Benzodiazepines: NEGATIVE
Buprenorphine: 471 ng/mg{creat}
COCAINE METABOLITE IA: NEGATIVE ng/mL
Clonazepam: NOT DETECTED ng/mg{creat}
Creatinine: 99 mg/dL
Desalkylflurazepam: NOT DETECTED ng/mg{creat}
Desmethyldiazepam: NOT DETECTED ng/mg{creat}
Desmethylflunitrazepam: NOT DETECTED ng/mg{creat}
Diazepam: NOT DETECTED ng/mg{creat}
ETHYL ALCOHOL Enzymatic: NEGATIVE g/dL
FENTANYL: NEGATIVE
Fentanyl: NOT DETECTED ng/mg{creat}
Flunitrazepam: NOT DETECTED ng/mg{creat}
Lorazepam: NOT DETECTED ng/mg{creat}
METHADONE IA: NEGATIVE ng/mL
METHADONE MTB IA: NEGATIVE ng/mL
Midazolam: NOT DETECTED ng/mg{creat}
Norbuprenorphine: >1010 ng/mg{creat}
Norfentanyl: NOT DETECTED ng/mg{creat}
OPIATE CLASS IA: NEGATIVE ng/mL
Oxazepam: NOT DETECTED ng/mg{creat}
PHENCYCLIDINE IA: NEGATIVE ng/mL
TAPENTADOL, IA: NEGATIVE ng/mL
TRAMADOL IA: NEGATIVE ng/mL
Temazepam: NOT DETECTED ng/mg{creat}

## 2024-09-30 LAB — OXYCODONE CLASS, MS, UR RFX
Noroxycodone: NOT DETECTED ng/mg{creat}
Noroxymorphone: 75 ng/mg{creat}
Oxycodone Class Confirmation: POSITIVE
Oxycodone: NOT DETECTED ng/mg{creat}
Oxymorphone: 101 ng/mg{creat}

## 2024-09-30 LAB — CANNABINOIDS, MS, UR RFX
Cannabinoids Confirmation: POSITIVE
Carboxy-THC: 30 ng/mg{creat}

## 2024-10-04 ENCOUNTER — Other Ambulatory Visit (HOSPITAL_COMMUNITY): Payer: Self-pay

## 2024-10-06 ENCOUNTER — Other Ambulatory Visit (HOSPITAL_COMMUNITY): Payer: Self-pay | Admitting: Registered Nurse

## 2024-10-06 ENCOUNTER — Other Ambulatory Visit: Payer: Self-pay

## 2024-10-06 ENCOUNTER — Telehealth (HOSPITAL_COMMUNITY): Payer: Self-pay | Admitting: Registered Nurse

## 2024-10-06 ENCOUNTER — Other Ambulatory Visit (HOSPITAL_COMMUNITY): Payer: Self-pay

## 2024-10-06 DIAGNOSIS — G4700 Insomnia, unspecified: Secondary | ICD-10-CM

## 2024-10-06 MED ORDER — PROPRANOLOL HCL 10 MG PO TABS
10.0000 mg | ORAL_TABLET | Freq: Three times a day (TID) | ORAL | 1 refills | Status: AC
  Filled 2024-10-06: qty 60, 20d supply, fill #0

## 2024-10-06 MED ORDER — TRAZODONE HCL 100 MG PO TABS
50.0000 mg | ORAL_TABLET | Freq: Every evening | ORAL | 1 refills | Status: AC | PRN
  Filled 2024-10-06: qty 30, 60d supply, fill #0

## 2024-10-06 MED ORDER — BUSPIRONE HCL 5 MG PO TABS
5.0000 mg | ORAL_TABLET | Freq: Two times a day (BID) | ORAL | 1 refills | Status: AC
  Filled 2024-10-06: qty 60, 30d supply, fill #0

## 2024-10-06 NOTE — Telephone Encounter (Signed)
 Called pt and spoke with her, she is not having any suicidal or homicidal thoughts, just extremely anxious. Pt states that she has taken 4 hydroxyzines and it's not helping. Also she says that she has not been able to sleep she has been up for 24 hrs now. She said that she has taking the amitripyline and has taken 2 trazodones and has not been to sleep. Please advise.

## 2024-10-06 NOTE — Telephone Encounter (Signed)
 Patient called stating she was instructed by provider to call if she had any issues with her medications. States She has been taking hydrOXYzine  (ATARAX ) 10 MG tablet 3 times daily and is still experiencing racing thoughts, anxiety,and panic attacks. States she is also taking traZODone  (DESYREL ) 50 MG tablet at night but still having trouble sleeping. Asking if medications can be increased/adjusted. Also scheduled follow up appointment with provider. Requested female therapist for counseling - request referred to referral coordinator for scheduling in a different clinic.   [place] [facility] Phone: [phone]  Fax: [phone]     Last visit: 09/29/2024, Next visit: 12/01/2024.

## 2024-10-06 NOTE — Telephone Encounter (Signed)
 Patient called office with complaints of extreme anxiety, Hydroxyzine  not helping.  Also complaints of no sleep in 24 hours Trazodone  not working,   Education Officer, Environmental in script for Buspar  hopefully will help with anxiety also Propranolol  10 mg three time daily as needed since Hydroxyzine  is not working.  Increase Trazodone  to 100 mg daily at bedtime.  If no improvement may need to make medication changes and will need to reschedule sooner visit.    Meds ordered this encounter  Medications   busPIRone  (BUSPAR ) 5 MG tablet    Sig: Take 1 tablet (5 mg total) by mouth 2 (two) times daily.    Dispense:  60 tablet    Refill:  1    Supervising Provider:   CURRY, SYED T [[id number]]   propranolol  (INDERAL ) 10 MG tablet    Sig: Take 1 tablet (10 mg total) by mouth 3 (three) times daily.    Dispense:  60 tablet    Refill:  1    Supervising Provider:   ARFEEN, SYED T [[id number]]   traZODone  (DESYREL ) 100 MG tablet    Sig: Take 0.5 tablets (50 mg total) by mouth at bedtime as needed for sleep.    Dispense:  30 tablet    Refill:  1    Supervising Provider:   CURRY PATERSON T [[id number]]

## 2024-10-07 NOTE — Telephone Encounter (Signed)
 Called pt no answer left vm

## 2024-10-08 ENCOUNTER — Encounter: Payer: Self-pay | Admitting: Family Medicine

## 2024-10-11 ENCOUNTER — Other Ambulatory Visit (HOSPITAL_COMMUNITY): Payer: Self-pay

## 2024-10-13 ENCOUNTER — Other Ambulatory Visit (HOSPITAL_COMMUNITY): Payer: Self-pay

## 2024-10-13 ENCOUNTER — Telehealth: Payer: Self-pay | Admitting: Family Medicine

## 2024-10-13 DIAGNOSIS — Z79891 Long term (current) use of opiate analgesic: Secondary | ICD-10-CM

## 2024-10-13 MED ORDER — BUPRENORPHINE HCL-NALOXONE HCL 8-2 MG SL FILM
1.0000 | ORAL_FILM | Freq: Four times a day (QID) | SUBLINGUAL | 0 refills | Status: AC
  Filled 2024-10-13: qty 120, 30d supply, fill #0
  Filled ????-??-??: fill #0

## 2024-10-13 NOTE — Addendum Note (Signed)
 Addended by: Lavida Patch, MATEO on: 10/13/2024 03:56 PM   Modules accepted: Orders

## 2024-10-13 NOTE — Telephone Encounter (Signed)
 The patient requested an early medication refill. She has enough to last until Friday but is concerned that the weather may prevent her from obtaining a refill in time. She does not want to run out of her medication and is asking to receive it sooner.

## 2024-10-14 ENCOUNTER — Encounter: Payer: Self-pay | Admitting: Family Medicine

## 2024-10-14 ENCOUNTER — Other Ambulatory Visit (HOSPITAL_COMMUNITY): Payer: Self-pay

## 2024-10-15 ENCOUNTER — Other Ambulatory Visit (HOSPITAL_COMMUNITY): Payer: Self-pay

## 2024-10-18 ENCOUNTER — Other Ambulatory Visit (HOSPITAL_COMMUNITY): Payer: Self-pay

## 2024-11-02 ENCOUNTER — Ambulatory Visit: Payer: Self-pay

## 2024-11-04 ENCOUNTER — Ambulatory Visit: Payer: MEDICAID | Admitting: Neurology

## 2024-12-01 ENCOUNTER — Telehealth (HOSPITAL_COMMUNITY): Payer: MEDICAID | Admitting: Registered Nurse
